# Patient Record
Sex: Male | Born: 1965
Health system: Southern US, Community
[De-identification: ages and names within clinical notes are randomized; demographics above are authoritative.]

## PROBLEM LIST (undated history)

## (undated) DIAGNOSIS — I10 Essential (primary) hypertension: Secondary | ICD-10-CM

## (undated) DIAGNOSIS — I428 Other cardiomyopathies: Secondary | ICD-10-CM

## (undated) DIAGNOSIS — E785 Hyperlipidemia, unspecified: Secondary | ICD-10-CM

## (undated) DIAGNOSIS — K219 Gastro-esophageal reflux disease without esophagitis: Secondary | ICD-10-CM

## (undated) DIAGNOSIS — E119 Type 2 diabetes mellitus without complications: Secondary | ICD-10-CM

## (undated) HISTORY — DX: Gastro-esophageal reflux disease without esophagitis: K21.9

## (undated) HISTORY — DX: Hyperlipidemia, unspecified: E78.5

## (undated) HISTORY — DX: Other cardiomyopathies: I42.8

## (undated) HISTORY — PX: HAND SURGERY: SHX662

---

## 2017-04-05 ENCOUNTER — Encounter (HOSPITAL_BASED_OUTPATIENT_CLINIC_OR_DEPARTMENT_OTHER): Payer: Self-pay | Admitting: Emergency Medicine

## 2017-04-05 ENCOUNTER — Other Ambulatory Visit: Payer: Self-pay

## 2017-04-05 ENCOUNTER — Emergency Department (HOSPITAL_BASED_OUTPATIENT_CLINIC_OR_DEPARTMENT_OTHER): Payer: Self-pay

## 2017-04-05 ENCOUNTER — Emergency Department (HOSPITAL_BASED_OUTPATIENT_CLINIC_OR_DEPARTMENT_OTHER)
Admission: EM | Admit: 2017-04-05 | Discharge: 2017-04-05 | Disposition: A | Discharge status: Home / Self Care | Payer: Self-pay | Attending: Emergency Medicine | Admitting: Emergency Medicine

## 2017-04-05 DIAGNOSIS — I10 Essential (primary) hypertension: Secondary | ICD-10-CM | POA: Insufficient documentation

## 2017-04-05 DIAGNOSIS — H9311 Tinnitus, right ear: Secondary | ICD-10-CM | POA: Insufficient documentation

## 2017-04-05 DIAGNOSIS — R42 Dizziness and giddiness: Secondary | ICD-10-CM

## 2017-04-05 HISTORY — DX: Essential (primary) hypertension: I10

## 2017-04-05 LAB — URINALYSIS, MICROSCOPIC (REFLEX)

## 2017-04-05 LAB — URINALYSIS, ROUTINE W REFLEX MICROSCOPIC
Bilirubin Urine: NEGATIVE
Ketones, ur: 15 mg/dL — AB
LEUKOCYTES UA: NEGATIVE
Nitrite: NEGATIVE
PROTEIN: NEGATIVE mg/dL
SPECIFIC GRAVITY, URINE: 1.015 (ref 1.005–1.030)
pH: 6 (ref 5.0–8.0)

## 2017-04-05 LAB — CBC
HEMATOCRIT: 46.2 % (ref 39.0–52.0)
HEMOGLOBIN: 15.7 g/dL (ref 13.0–17.0)
MCH: 25.7 pg — ABNORMAL LOW (ref 26.0–34.0)
MCHC: 34 g/dL (ref 30.0–36.0)
MCV: 75.6 fL — AB (ref 78.0–100.0)
Platelets: 321 10*3/uL (ref 150–400)
RBC: 6.11 MIL/uL — ABNORMAL HIGH (ref 4.22–5.81)
RDW: 13.9 % (ref 11.5–15.5)
WBC: 13.6 10*3/uL — ABNORMAL HIGH (ref 4.0–10.5)

## 2017-04-05 LAB — BASIC METABOLIC PANEL
ANION GAP: 12 (ref 5–15)
BUN: 16 mg/dL (ref 6–20)
CHLORIDE: 101 mmol/L (ref 101–111)
CO2: 22 mmol/L (ref 22–32)
Calcium: 9.3 mg/dL (ref 8.9–10.3)
Creatinine, Ser: 1.3 mg/dL — ABNORMAL HIGH (ref 0.61–1.24)
GFR calc Af Amer: >60 mL/min (ref >60)
GFR calc non Af Amer: >60 mL/min (ref >60)
GLUCOSE: 372 mg/dL — AB (ref 65–99)
POTASSIUM: 3.6 mmol/L (ref 3.5–5.1)
Sodium: 135 mmol/L (ref 135–145)

## 2017-04-05 LAB — MAGNESIUM: Magnesium: 1.7 mg/dL (ref 1.7–2.4)

## 2017-04-05 LAB — CBG MONITORING, ED: Glucose-Capillary: 328 mg/dL — ABNORMAL HIGH (ref 65–99)

## 2017-04-05 LAB — TROPONIN I: Troponin I: <0.03 ng/mL (ref <0.03)

## 2017-04-05 MED ORDER — SODIUM CHLORIDE 0.9 % IV BOLUS (SEPSIS)
1000.0000 mL | Freq: Once | INTRAVENOUS | Status: AC
  Administered 2017-04-05: 1000 mL via INTRAVENOUS

## 2017-04-05 NOTE — ED Provider Notes (Signed)
Emergency Department Provider Note   I have reviewed the triage vital signs and the nursing notes.   HISTORY  Chief Complaint Dizziness and Tinnitus   HPI Darian Cansler is a 52 y.o. male with PMH of HTN presents to the emergency department for evaluation of sudden onset lightheadedness, ringing in the right ear, and history of recent spasm type symptoms in the left arm and leg.  The spasm symptoms were primarily yesterday and seemed to resolve.  Today at 5:45 PM he developed acute onset lightheadedness with ringing in the right ear.  Immediately prior he had had some orange juice and lemonade to drink.  He has no known history of diabetes.  He denies any fevers or chills.  No chest pain or dyspnea.  He denies vertigo symptoms.  No neck pain.  Denies any weakness in the arms or legs.  No vision changes, speech changes, difficulty ambulating.  His ringing in the ears and spasm symptoms have resolved but he still feels somewhat lightheaded.    Past Medical History:  Diagnosis Date  . Hypertension     There are no active problems to display for this patient.   History reviewed. No pertinent surgical history.    Allergies Patient has no known allergies.  No family history on file.  Social History Social History   Tobacco Use  . Smoking status: Never Smoker  . Smokeless tobacco: Never Used  Substance Use Topics  . Alcohol use: Not Currently  . Drug use: Never    Review of Systems  Constitutional: No fever/chills. Positive lightheadedness.  Eyes: No visual changes. ENT: No sore throat. Tinnitus.  Cardiovascular: Denies chest pain. Respiratory: Denies shortness of breath. Gastrointestinal: No abdominal pain.  No nausea, no vomiting.  No diarrhea.  No constipation. Genitourinary: Negative for dysuria. Musculoskeletal: Negative for back pain. Skin: Negative for rash. Neurological: Negative for headaches, focal weakness or numbness.  10-point ROS otherwise  negative.  ____________________________________________   PHYSICAL EXAM:  VITAL SIGNS: ED Triage Vitals [04/05/17 1810]  Enc Vitals Group     BP (!) 156/113     Pulse Rate (!) 130     Resp 20     Temp 98.8 F (37.1 C)     Temp Source Oral     SpO2 94 %     Weight 283 lb (128.4 kg)     Height 5\' 8"  (1.727 m)     Pain Score 0   Constitutional: Alert and oriented. Well appearing and in no acute distress. Eyes: Conjunctivae are normal. PERRL. EOMI. Head: Atraumatic. Nose: No congestion/rhinnorhea. Mouth/Throat: Mucous membranes are moist.  Neck: No stridor.  Cardiovascular: Normal rate, regular rhythm. Good peripheral circulation. Grossly normal heart sounds.   Respiratory: Normal respiratory effort.  No retractions. Lungs CTAB. Gastrointestinal: Soft and nontender. No distention.  Musculoskeletal: No lower extremity tenderness nor edema. No gross deformities of extremities. Neurologic:  Normal speech and language. No gross focal neurologic deficits are appreciated. Normal CN exam 2-12. Normal gait.  Skin:  Skin is warm, dry and intact. No rash noted.  ____________________________________________   LABS (all labs ordered are listed, but only abnormal results are displayed)  Labs Reviewed  BASIC METABOLIC PANEL - Abnormal; Notable for the following components:      Result Value   Glucose, Bld 372 (*)    Creatinine, Ser 1.30 (*)    All other components within normal limits  CBC - Abnormal; Notable for the following components:   WBC 13.6 (*)  RBC 6.11 (*)    MCV 75.6 (*)    MCH 25.7 (*)    All other components within normal limits  URINALYSIS, ROUTINE W REFLEX MICROSCOPIC - Abnormal; Notable for the following components:   Glucose, UA >=500 (*)    Hgb urine dipstick TRACE (*)    Ketones, ur 15 (*)    All other components within normal limits  URINALYSIS, MICROSCOPIC (REFLEX) - Abnormal; Notable for the following components:   Bacteria, UA RARE (*)    Squamous  Epithelial / LPF 0-5 (*)    All other components within normal limits  CBG MONITORING, ED - Abnormal; Notable for the following components:   Glucose-Capillary 328 (*)    All other components within normal limits  MAGNESIUM  TROPONIN I   ____________________________________________  EKG  Rate: 126 PR: 160 QTc: 428  Sinus tachycardia. Normal axis. Narrow QRS. No ST elevation or depression. No STEMI.  ____________________________________________  RADIOLOGY  Dg Chest 2 View  Result Date: 04/05/2017 CLINICAL DATA:  Dizziness for 2 hours EXAM: CHEST - 2 VIEW COMPARISON:  None. FINDINGS: The heart size and mediastinal contours are within normal limits. Both lungs are clear. The visualized skeletal structures are unremarkable. IMPRESSION: No active cardiopulmonary disease. Electronically Signed   By: Inez Catalina M.D.   On: 04/05/2017 19:08    ____________________________________________   PROCEDURES  Procedure(s) performed:   Procedures  None ____________________________________________   INITIAL IMPRESSION / ASSESSMENT AND PLAN / ED COURSE  Pertinent labs & imaging results that were available during my care of the patient were reviewed by me and considered in my medical decision making (see chart for details).  Patient presents to the emergency department for evaluation of sudden onset lightheadedness with ringing in the ears and spasm the pain in the left side over the past 2 days. Denies any weakness/numbness. He shows no evidence of volume overload.  He is not having chest pain or difficulty breathing.  No palpitations.  On my evaluation at the bedside he is having frequent PVCs on monitor but no bigeminy.  He is afebrile here but very tachycardic.  I reviewed the EKG which shows a sinus tachycardia with narrow QRS and normal axis.   Labs reviewed. No acute findings. Imaging negative. Feeling much better after IVF. No concern for central cause of symptoms. Plan for PCP and  Cardiology follow up.   At this time, I do not feel there is any life-threatening condition present. I have reviewed and discussed all results (EKG, imaging, lab, urine as appropriate), exam findings with patient. I have reviewed nursing notes and appropriate previous records.  I feel the patient is safe to be discharged home without further emergent workup. Discussed usual and customary return precautions. Patient and family (if present) verbalize understanding and are comfortable with this plan.  Patient will follow-up with their primary care provider. If they do not have a primary care provider, information for follow-up has been provided to them. All questions have been answered.  ____________________________________________  FINAL CLINICAL IMPRESSION(S) / ED DIAGNOSES  Final diagnoses:  Lightheadedness     MEDICATIONS GIVEN DURING THIS VISIT:  Medications  sodium chloride 0.9 % bolus 1,000 mL (0 mLs Intravenous Stopped 04/05/17 2029)    Note:  This document was prepared using Dragon voice recognition software and may include unintentional dictation errors.  Nanda Quinton, MD Emergency Medicine    Larenz Frasier, [facility], MD 04/06/17 [phone]

## 2017-04-05 NOTE — Discharge Instructions (Signed)
You have been seen today in the Emergency Department (ED)  for lightheadedness.  Your workup including labs and EKG show reassuring results.  Your symptoms may be due to dehydration, so it is important that you drink plenty of non-alcoholic fluids.  Please call your regular doctor as soon as possible to schedule the next available clinic appointment to follow up with him/her regarding your visit to the ED and your symptoms.  Return to the Emergency Department (ED)  if you have any further syncopal episodes (pass out again) or develop ANY chest pain, pressure, tightness, trouble breathing, sudden sweating, or other symptoms that concern you.

## 2017-04-05 NOTE — ED Notes (Signed)
ED Provider at bedside. Dr. Long 

## 2017-04-05 NOTE — ED Triage Notes (Signed)
Pt c/o dizziness, ringing to R ear and L arm "spasms" at 5:30. Pt reports still feeling dizzy but all other symptoms have subsided.

## 2017-04-05 NOTE — ED Notes (Signed)
Pt given d/c instructions as per chart. Verbalizes understanding. No questions. 

## 2017-04-05 NOTE — ED Notes (Signed)
Patient transported to X-ray 

## 2017-09-08 ENCOUNTER — Emergency Department (HOSPITAL_BASED_OUTPATIENT_CLINIC_OR_DEPARTMENT_OTHER): Payer: Self-pay

## 2017-09-08 ENCOUNTER — Other Ambulatory Visit: Payer: Self-pay

## 2017-09-08 ENCOUNTER — Encounter (HOSPITAL_BASED_OUTPATIENT_CLINIC_OR_DEPARTMENT_OTHER): Payer: Self-pay | Admitting: *Deleted

## 2017-09-08 ENCOUNTER — Emergency Department (HOSPITAL_BASED_OUTPATIENT_CLINIC_OR_DEPARTMENT_OTHER)
Admission: EM | Admit: 2017-09-08 | Discharge: 2017-09-08 | Disposition: A | Discharge status: Home / Self Care | Payer: Self-pay | Attending: Emergency Medicine | Admitting: Emergency Medicine

## 2017-09-08 DIAGNOSIS — J4530 Mild persistent asthma, uncomplicated: Secondary | ICD-10-CM | POA: Insufficient documentation

## 2017-09-08 DIAGNOSIS — I10 Essential (primary) hypertension: Secondary | ICD-10-CM | POA: Insufficient documentation

## 2017-09-08 DIAGNOSIS — R911 Solitary pulmonary nodule: Secondary | ICD-10-CM | POA: Insufficient documentation

## 2017-09-08 DIAGNOSIS — J40 Bronchitis, not specified as acute or chronic: Secondary | ICD-10-CM | POA: Insufficient documentation

## 2017-09-08 LAB — CBC
HCT: 45 % (ref 39.0–52.0)
Hemoglobin: 15.3 g/dL (ref 13.0–17.0)
MCH: 25.3 pg — ABNORMAL LOW (ref 26.0–34.0)
MCHC: 34 g/dL (ref 30.0–36.0)
MCV: 74.5 fL — ABNORMAL LOW (ref 78.0–100.0)
Platelets: 282 10*3/uL (ref 150–400)
RBC: 6.04 MIL/uL — ABNORMAL HIGH (ref 4.22–5.81)
RDW: 13.4 % (ref 11.5–15.5)
WBC: 11.4 10*3/uL — AB (ref 4.0–10.5)

## 2017-09-08 LAB — D-DIMER, QUANTITATIVE: D-Dimer, Quant: 0.59 ug/mL-FEU — ABNORMAL HIGH (ref 0.00–0.50)

## 2017-09-08 LAB — BASIC METABOLIC PANEL
ANION GAP: 9 (ref 5–15)
BUN: 11 mg/dL (ref 6–20)
CALCIUM: 8.7 mg/dL — AB (ref 8.9–10.3)
CO2: 23 mmol/L (ref 22–32)
Chloride: 101 mmol/L (ref 98–111)
Creatinine, Ser: 0.82 mg/dL (ref 0.61–1.24)
GFR calc Af Amer: >60 mL/min (ref >60)
GFR calc non Af Amer: >60 mL/min (ref >60)
Glucose, Bld: 206 mg/dL — ABNORMAL HIGH (ref 70–99)
POTASSIUM: 3.8 mmol/L (ref 3.5–5.1)
Sodium: 133 mmol/L — ABNORMAL LOW (ref 135–145)

## 2017-09-08 LAB — TROPONIN I: Troponin I: <0.03 ng/mL (ref <0.03)

## 2017-09-08 MED ORDER — AZITHROMYCIN 250 MG PO TABS: 250.0000 mg | ORAL_TABLET | Freq: Every day | ORAL | 0 refills | Status: DC

## 2017-09-08 MED ORDER — PREDNISONE 10 MG PO TABS: 40.0000 mg | ORAL_TABLET | Freq: Every day | ORAL | 0 refills | Status: DC

## 2017-09-08 MED ORDER — ALBUTEROL SULFATE HFA 108 (90 BASE) MCG/ACT IN AERS
2.0000 | INHALATION_SPRAY | Freq: Four times a day (QID) | RESPIRATORY_TRACT | Status: DC
  Administered 2017-09-08: 2 via RESPIRATORY_TRACT
  Filled 2017-09-08: qty 6.7

## 2017-09-08 MED ORDER — IPRATROPIUM-ALBUTEROL 0.5-2.5 (3) MG/3ML IN SOLN
3.0000 mL | Freq: Four times a day (QID) | RESPIRATORY_TRACT | Status: DC
  Administered 2017-09-08: 3 mL via RESPIRATORY_TRACT
  Filled 2017-09-08: qty 3

## 2017-09-08 MED ORDER — SODIUM CHLORIDE 0.9 % IV SOLN
INTRAVENOUS | Status: DC
  Administered 2017-09-08: 20:00:00 via INTRAVENOUS

## 2017-09-08 MED ORDER — PREDNISONE 50 MG PO TABS
60.0000 mg | ORAL_TABLET | Freq: Once | ORAL | Status: AC
  Administered 2017-09-08: 60 mg via ORAL
  Filled 2017-09-08: qty 1

## 2017-09-08 MED ORDER — IOPAMIDOL (ISOVUE-370) INJECTION 76%
100.0000 mL | Freq: Once | INTRAVENOUS | Status: AC | PRN
  Administered 2017-09-08: 100 mL via INTRAVENOUS

## 2017-09-08 NOTE — ED Provider Notes (Signed)
[facility] Provider Note   CSN: [id number] Arrival date & time: 09/08/17  1652     History   Chief Complaint Chief Complaint  Patient presents with  . Chest Pain    HPI [facility] is a 52 y.o. male.  Patient with wheezing that comes and goes it started about 4 weeks ago.  Patient with no prior history of asthma.  Associated obviously with shortness of breath and recently a dry cough.  No fevers.  Patient is not a smoker.  No chest pain.     Past Medical History:  Diagnosis Date  . Hypertension     There are no active problems to display for this patient.   History reviewed. No pertinent surgical history.      Home Medications    Prior to Admission medications   Medication Sig Start Date End Date Taking? Authorizing Provider  azithromycin (ZITHROMAX) 250 MG tablet Take 1 tablet (250 mg total) by mouth daily. Take first 2 tablets together, then 1 every day until finished. 09/08/17   Fredia Sorrow, MD  predniSONE (DELTASONE) 10 MG tablet Take 4 tablets (40 mg total) by mouth daily. 09/08/17   Fredia Sorrow, MD    Family History No family history on file.  Social History Social History   Tobacco Use  . Smoking status: Never Smoker  . Smokeless tobacco: Never Used  Substance Use Topics  . Alcohol use: Not Currently  . Drug use: Never     Allergies   Patient has no known allergies.   Review of Systems Review of Systems  Constitutional: Negative for fever.  HENT: Negative for congestion.   Eyes: Negative for redness.  Respiratory: Positive for cough and shortness of breath.   Cardiovascular: Negative for chest pain.  Gastrointestinal: Negative for abdominal pain.  Genitourinary: Negative for dysuria.  Musculoskeletal: Negative for back pain.  Skin: Negative for rash.  Neurological: Negative for syncope and headaches.  Hematological: Does not bruise/bleed easily.  Psychiatric/Behavioral: Negative for  confusion.     Physical Exam Updated Vital Signs BP (!) 144/89   Pulse (!) 108   Temp 98.2 F (36.8 C) (Oral)   Resp 19   Ht 1.727 m (5\' 8" )   Wt 129.3 kg   SpO2 98%   BMI 43.33 kg/m   Physical Exam  Constitutional: He is oriented to person, place, and time. He appears well-developed and well-nourished. No distress.  HENT:  Head: Normocephalic and atraumatic.  Mouth/Throat: Oropharynx is clear and moist.  Eyes: Pupils are equal, round, and reactive to light. Conjunctivae and EOM are normal.  Neck: Neck supple.  Cardiovascular: Normal rate, regular rhythm and normal heart sounds.  Pulmonary/Chest: Effort normal and breath sounds normal. No respiratory distress. He has no wheezes.  Abdominal: Soft. Bowel sounds are normal. There is no tenderness.  Musculoskeletal: Normal range of motion. He exhibits no edema.  Neurological: He is alert and oriented to person, place, and time. No cranial nerve deficit or sensory deficit. He exhibits normal muscle tone. Coordination normal.  Skin: Skin is warm. No rash noted.  Nursing note and vitals reviewed.    ED Treatments / Results  Labs (all labs ordered are listed, but only abnormal results are displayed) Labs Reviewed  CBC - Abnormal; Notable for the following components:      Result Value   WBC 11.4 (*)    RBC 6.04 (*)    MCV 74.5 (*)    MCH 25.3 (*)  All other components within normal limits  BASIC METABOLIC PANEL - Abnormal; Notable for the following components:   Sodium 133 (*)    Glucose, Bld 206 (*)    Calcium 8.7 (*)    All other components within normal limits  D-DIMER, QUANTITATIVE (NOT AT [facility]) - Abnormal; Notable for the following components:   D-Dimer, Quant 0.59 (*)    All other components within normal limits  TROPONIN I    EKG EKG Interpretation  Date/Time:  Monday September 08 2017 17:08:31 EDT Ventricular Rate:  110 PR Interval:  172 QRS Duration: 104 QT Interval:  346 QTC Calculation: 468 R  Axis:   -11 Text Interpretation:  Sinus tachycardia Right atrial enlargement Nonspecific T wave abnormality Abnormal ECG Confirmed by Fredia Sorrow [phone]) on 09/08/2017 5:11:05 PM   Radiology Dg Chest 2 View  Result Date: 09/08/2017 CLINICAL DATA:  Shortness of breath for months. EXAM: CHEST - 2 VIEW COMPARISON:  Chest radiograph April 05, 2017 FINDINGS: Cardiomediastinal silhouette is normal. No pleural effusions or focal consolidations. Mild interstitial prominence. Trachea projects midline and there is no pneumothorax. Soft tissue planes and included osseous structures are non-suspicious. IMPRESSION: Mild interstitial prominence, atypical infection possible. No focal consolidation. Electronically Signed   By: Elon Alas M.D.   On: 09/08/2017 19:14   Ct Angio Chest Pe W/cm &/or Wo Cm  Result Date: 09/08/2017 CLINICAL DATA:  Positive D-dimer.  Wheezing. EXAM: CT ANGIOGRAPHY CHEST WITH CONTRAST TECHNIQUE: Multidetector CT imaging of the chest was performed using the standard protocol during bolus administration of intravenous contrast. Multiplanar CT image reconstructions and MIPs were obtained to evaluate the vascular anatomy. CONTRAST:  133mL ISOVUE-370 IOPAMIDOL (ISOVUE-370) INJECTION 76% COMPARISON:  Chest x-ray earlier today FINDINGS: Cardiovascular: No filling defects in the pulmonary arteries to suggest pulmonary emboli. Insert Heart scattered calcifications in the left anterior descending coronary artery. Mediastinum/Nodes: No mediastinal, hilar, or axillary adenopathy. Scattered reactive mediastinal lymph nodes. Lungs/Pleura: Trace bilateral pleural effusions. Mild interstitial prominence and peribronchial thickening may reflect bronchitis. Ground-glass nodular opacity in the superior segment of the left lower lobe measures 15 mm. Favor infectious/inflammatory. This warrants follow-up. Upper Abdomen: Mild diffuse fatty infiltration of the liver. No acute findings. Musculoskeletal: Chest  wall soft tissues are unremarkable. No acute bony abnormality. Review of the MIP images confirms the above findings. IMPRESSION: No evidence of pulmonary embolus. Interstitial prominence and peribronchial thickening throughout the lungs which could reflect bronchitis or asthma. 15 mm ground-glass nodular opacity in the superior segment of the left lower lobe. Initial follow-up with CT at 6-12 months is recommended to confirm persistence. If persistent, repeat CT is recommended every 2 years until 5 years of stability has been established. This recommendation follows the consensus statement: Guidelines for Management of Incidental Pulmonary Nodules Detected on CT Images: From the Fleischner Society 2017; Radiology 2017; 284:228-243. Trace bilateral pleural effusions. Electronically Signed   By: Rolm Baptise M.D.   On: 09/08/2017 20:56    Procedures Procedures (including critical care time)  Medications Ordered in ED Medications  ipratropium-albuterol (DUONEB) 0.5-2.5 (3) MG/3ML nebulizer solution 3 mL (3 mLs Nebulization Given 09/08/17 1907)  0.9 %  sodium chloride infusion ( Intravenous New Bag/Given 09/08/17 1955)  predniSONE (DELTASONE) tablet 60 mg (has no administration in time range)  albuterol (PROVENTIL HFA;VENTOLIN HFA) 108 (90 Base) MCG/ACT inhaler 2 puff (has no administration in time range)  iopamidol (ISOVUE-370) 76 % injection 100 mL (100 mLs Intravenous Contrast Given 09/08/17 2033)     Initial Impression /  Assessment and Plan / ED Course  I have reviewed the triage vital signs and the nursing notes.  Pertinent labs & imaging results that were available during my care of the patient were reviewed by me and considered in my medical decision making (see chart for details).     Patient without any wheezing here.  But was giving a history of some wheezing on and off for the past month or so.  Patient non-smoker.  Chest x-ray without any significant findings.  Troponin negative d-dimer  slightly elevated so CT angios chest no evidence of pulmonary embolism.  However CT chest did raise few concerns showing some inflammatory changes it could be consistent with bronchitis or asthma type picture.  Also there was a left lower lobe nodule that will require close follow-up.  Patient be treated with a dose of prednisone 60 mg here continue with prednisone for the next 5 days.  Albuterol inhaler provided here and shown how to use we will continue that for the next 7 days.  And patient also given a Z-Pak antibiotic.  Patient understands that he needs follow-up for the pulmonary nodule in 6 months.  Patient is also going to seek to find a primary care provider.  Patient will return for any new or worse symptoms.  Patient nontoxic no acute distress.  Final Clinical Impressions(s) / ED Diagnoses   Final diagnoses:  Bronchitis  Mild persistent reactive airway disease without complication  Pulmonary nodule, left    ED Discharge Orders         Ordered    azithromycin (ZITHROMAX) 250 MG tablet  Daily     09/08/17 2119    predniSONE (DELTASONE) 10 MG tablet  Daily     09/08/17 2119           Fredia Sorrow, MD 09/08/17 2128

## 2017-09-08 NOTE — ED Notes (Signed)
Patient transported to X-ray 

## 2017-09-08 NOTE — ED Triage Notes (Signed)
Wheezing that comes and goes started 4 weeks ago. States it feels like anxiety. No hx of asthma.

## 2017-09-08 NOTE — ED Notes (Signed)
ED Provider at bedside. 

## 2017-09-08 NOTE — Discharge Instructions (Addendum)
Use albuterol inhaler 2 puffs every 6 hours for the next 7 days.  Take prednisone for the next 5 days.  Take the antibiotic as directed over the next few days.  Part of your work-up today identified a left-sided pulmonary nodule this will require close follow-up and repeat evaluation in 6 months.  Return for any new or worse symptoms.

## 2017-09-08 NOTE — ED Notes (Signed)
Pt in CT.

## 2017-09-27 ENCOUNTER — Other Ambulatory Visit: Payer: Self-pay

## 2017-09-27 ENCOUNTER — Emergency Department (HOSPITAL_BASED_OUTPATIENT_CLINIC_OR_DEPARTMENT_OTHER)
Admission: EM | Admit: 2017-09-27 | Discharge: 2017-09-27 | Disposition: A | Discharge status: Home / Self Care | Payer: Self-pay | Attending: Emergency Medicine | Admitting: Emergency Medicine

## 2017-09-27 ENCOUNTER — Encounter (HOSPITAL_BASED_OUTPATIENT_CLINIC_OR_DEPARTMENT_OTHER): Payer: Self-pay | Admitting: Emergency Medicine

## 2017-09-27 ENCOUNTER — Emergency Department (HOSPITAL_BASED_OUTPATIENT_CLINIC_OR_DEPARTMENT_OTHER): Payer: Self-pay

## 2017-09-27 DIAGNOSIS — I10 Essential (primary) hypertension: Secondary | ICD-10-CM | POA: Insufficient documentation

## 2017-09-27 DIAGNOSIS — Z79899 Other long term (current) drug therapy: Secondary | ICD-10-CM | POA: Insufficient documentation

## 2017-09-27 DIAGNOSIS — E1165 Type 2 diabetes mellitus with hyperglycemia: Secondary | ICD-10-CM

## 2017-09-27 DIAGNOSIS — R0789 Other chest pain: Secondary | ICD-10-CM | POA: Insufficient documentation

## 2017-09-27 DIAGNOSIS — E119 Type 2 diabetes mellitus without complications: Secondary | ICD-10-CM | POA: Insufficient documentation

## 2017-09-27 LAB — BASIC METABOLIC PANEL
ANION GAP: 14 (ref 5–15)
BUN: 12 mg/dL (ref 6–20)
CO2: 21 mmol/L — ABNORMAL LOW (ref 22–32)
Calcium: 8.9 mg/dL (ref 8.9–10.3)
Chloride: 101 mmol/L (ref 98–111)
Creatinine, Ser: 0.96 mg/dL (ref 0.61–1.24)
GFR calc Af Amer: >60 mL/min (ref >60)
Glucose, Bld: 240 mg/dL — ABNORMAL HIGH (ref 70–99)
POTASSIUM: 3.5 mmol/L (ref 3.5–5.1)
Sodium: 136 mmol/L (ref 135–145)

## 2017-09-27 LAB — CBC
HCT: 44 % (ref 39.0–52.0)
Hemoglobin: 15 g/dL (ref 13.0–17.0)
MCH: 25.5 pg — ABNORMAL LOW (ref 26.0–34.0)
MCHC: 34.1 g/dL (ref 30.0–36.0)
MCV: 74.8 fL — ABNORMAL LOW (ref 78.0–100.0)
PLATELETS: 307 10*3/uL (ref 150–400)
RBC: 5.88 MIL/uL — AB (ref 4.22–5.81)
RDW: 13.6 % (ref 11.5–15.5)
WBC: 14.8 10*3/uL — AB (ref 4.0–10.5)

## 2017-09-27 LAB — TROPONIN I: Troponin I: <0.03 ng/mL (ref <0.03)

## 2017-09-27 MED ORDER — METFORMIN HCL 1000 MG PO TABS: 1000.0000 mg | ORAL_TABLET | Freq: Every evening | ORAL | 2 refills | Status: DC

## 2017-09-27 MED ORDER — GI COCKTAIL ~~LOC~~
30.0000 mL | Freq: Once | ORAL | Status: AC
  Administered 2017-09-27: 30 mL via ORAL
  Filled 2017-09-27: qty 30

## 2017-09-27 MED ORDER — ALBUTEROL SULFATE HFA 108 (90 BASE) MCG/ACT IN AERS: 1.0000 | INHALATION_SPRAY | Freq: Four times a day (QID) | RESPIRATORY_TRACT | 2 refills | Status: DC | PRN

## 2017-09-27 NOTE — Discharge Instructions (Signed)
Work-up for the chest pain without any acute findings here today.  However we are seeing the trend for elevated blood sugars.  Recommend that she start the metformin with your evening meal.  Make an appointment with the wellness clinic to follow-up.  Her follow-up at the [facility] urgent care.  Return for any new or worse symptoms.  The Prilosec that you have at home and recently got just continue to take that for 2 weeks.

## 2017-09-27 NOTE — ED Provider Notes (Signed)
[facility] EMERGENCY DEPARTMENT Provider Note   CSN: [id number] Arrival date & time: 09/27/17  0719     History   Chief Complaint Chief Complaint  Patient presents with  . Chest Pain    HPI [facility] English is a 52 y.o. male.  Patient presents with burning in the chest substernal area since 8 PM last night.  It has been constant.  Nonradiating.  Associated with frequent burping.  Worse this morning.  Patient also seen in August for chest discomfort and shortness of breath.  With negative work-up at that time including CT angios.  At that time patient's blood pressure was borderline and blood sugars were in the 200 range.  Patient did get Prilosec over-the-counter and started taking that no significant improvement.     Past Medical History:  Diagnosis Date  . Hypertension     There are no active problems to display for this patient.   History reviewed. No pertinent surgical history.      Home Medications    Prior to Admission medications   Medication Sig Start Date End Date Taking? Authorizing Provider  albuterol (PROVENTIL HFA;VENTOLIN HFA) 108 (90 Base) MCG/ACT inhaler Inhale 1-2 puffs into the lungs every 6 (six) hours as needed for wheezing or shortness of breath. 09/27/17   Fredia Sorrow, MD  azithromycin (ZITHROMAX) 250 MG tablet Take 1 tablet (250 mg total) by mouth daily. Take first 2 tablets together, then 1 every day until finished. 09/08/17   Fredia Sorrow, MD  metFORMIN (GLUCOPHAGE) 1000 MG tablet Take 1 tablet (1,000 mg total) by mouth every evening. 09/27/17   Fredia Sorrow, MD  predniSONE (DELTASONE) 10 MG tablet Take 4 tablets (40 mg total) by mouth daily. 09/08/17   Fredia Sorrow, MD    Family History No family history on file.  Social History Social History   Tobacco Use  . Smoking status: Never Smoker  . Smokeless tobacco: Never Used  Substance Use Topics  . Alcohol use: Not Currently  . Drug use: Never     Allergies     Patient has no known allergies.   Review of Systems Review of Systems  Constitutional: Negative for fever.  HENT: Negative for congestion.   Eyes: Negative for visual disturbance.  Respiratory: Negative for shortness of breath.   Cardiovascular: Positive for chest pain.  Gastrointestinal: Negative for abdominal pain, nausea and vomiting.  Genitourinary: Negative for dysuria and frequency.  Musculoskeletal: Negative for back pain.  Skin: Negative for rash.  Neurological: Negative for headaches.  Hematological: Does not bruise/bleed easily.  Psychiatric/Behavioral: Negative for confusion.     Physical Exam Updated Vital Signs BP (!) 137/98   Pulse (!) 102   Temp 98.4 F (36.9 C) (Oral)   Resp (!) 24   Ht 1.727 m (5\' 8" )   Wt 129.3 kg   SpO2 93%   BMI 43.33 kg/m   Physical Exam  Constitutional: He is oriented to person, place, and time. He appears well-developed and well-nourished. No distress.  HENT:  Head: Normocephalic and atraumatic.  Mouth/Throat: Oropharynx is clear and moist.  Eyes: Pupils are equal, round, and reactive to light. Conjunctivae and EOM are normal.  Neck: Neck supple.  Cardiovascular: Normal rate, regular rhythm and normal heart sounds.  Pulmonary/Chest: Effort normal and breath sounds normal. No respiratory distress.  Abdominal: Soft. Bowel sounds are normal. There is no tenderness.  Musculoskeletal: Normal range of motion. He exhibits no edema.  Neurological: He is alert and oriented to person, place,  and time. No cranial nerve deficit or sensory deficit. He exhibits normal muscle tone. Coordination normal.  Skin: Skin is warm.  Nursing note and vitals reviewed.    ED Treatments / Results  Labs (all labs ordered are listed, but only abnormal results are displayed) Labs Reviewed  CBC - Abnormal; Notable for the following components:      Result Value   WBC 14.8 (*)    RBC 5.88 (*)    MCV 74.8 (*)    MCH 25.5 (*)    All other components  within normal limits  BASIC METABOLIC PANEL - Abnormal; Notable for the following components:   CO2 21 (*)    Glucose, Bld 240 (*)    All other components within normal limits  TROPONIN I    EKG EKG Interpretation  Date/Time:  Saturday September 27 2017 07:29:59 EDT Ventricular Rate:  112 PR Interval:    QRS Duration: 104 QT Interval:  353 QTC Calculation: 482 R Axis:   3 Text Interpretation:  Sinus tachycardia Ventricular premature complex Consider right atrial enlargement Borderline low voltage, extremity leads Borderline prolonged QT interval Baseline wander in lead(s) V1 No significant change since last tracing Confirmed by Fredia Sorrow [phone]) on 09/27/2017 7:37:56 AM   Radiology Dg Chest 2 View  Result Date: 09/27/2017 CLINICAL DATA:  52 year old male with history of mid sternal burning sensation since yesterday. No shortness of breath. EXAM: CHEST - 2 VIEW COMPARISON:  Chest x-ray 09/08/2017. FINDINGS: Lung volumes are normal. No consolidative airspace disease. No pleural effusions. No pneumothorax. No pulmonary nodule or mass noted. Pulmonary vasculature and the cardiomediastinal silhouette are within normal limits. IMPRESSION: No radiographic evidence of acute cardiopulmonary disease. Electronically Signed   By: Vinnie Langton M.D.   On: 09/27/2017 07:58    Procedures Procedures (including critical care time)  Medications Ordered in ED Medications  gi cocktail (Maalox,Lidocaine,Donnatal) (30 mLs Oral Given 09/27/17 0998)     Initial Impression / Assessment and Plan / ED Course  I have reviewed the triage vital signs and the nursing notes.  Pertinent labs & imaging results that were available during my care of the patient were reviewed by me and considered in my medical decision making (see chart for details).    Patient's work-up for the chest pain here without any acute findings.  EKG without any significant changes from before.  Did have some baseline  tachycardia.  Slightly above 100.  Troponin here today negative chest x-ray negative.  Do not clinically think this is associated with a pulmonary embolus.  Rule no shortness of breath.  Plus patient had CT angios in August for shortness of breath and chest pain at that time which was negative.  Patient's blood pressure remains borderline for hypertension.  And patient's blood sugars now been consistently in the 200s or 300 range when he is been seen in the emergency department.  Feel the patient does have type 2 diabetes.  We will start him on metformin.  Have patient continue Prilosec that he started over-the-counter yesterday.  And patient has benefited from the albuterol he was given in August so will give a renewal prescription for that.  We will have patient follow-up with the wellness clinic.  Patient given GI cocktail here with improvement in pain but not complete resolution.  Feel that patient's symptoms are not related to an acute cardiac or pulmonary event.  But more related perhaps to underlying diabetes and more consistent with reflux here today.   Final Clinical  Impressions(s) / ED Diagnoses   Final diagnoses:  Atypical chest pain  Type 2 diabetes mellitus with hyperglycemia, without long-term current use of insulin (HCC)  Essential hypertension    ED Discharge Orders         Ordered    albuterol (PROVENTIL HFA;VENTOLIN HFA) 108 (90 Base) MCG/ACT inhaler  Every 6 hours PRN     09/27/17 0842    metFORMIN (GLUCOPHAGE) 1000 MG tablet  Every evening     09/27/17 0842           Fredia Sorrow, MD 09/27/17 [phone]

## 2017-09-27 NOTE — ED Triage Notes (Signed)
Burning in chest since 8pm last night, has had frequent burping. Worse this am, to mid chest

## 2017-09-28 ENCOUNTER — Emergency Department (HOSPITAL_BASED_OUTPATIENT_CLINIC_OR_DEPARTMENT_OTHER)
Admission: EM | Admit: 2017-09-28 | Discharge: 2017-09-28 | Disposition: A | Discharge status: Home / Self Care | Payer: Self-pay | Attending: Emergency Medicine | Admitting: Emergency Medicine

## 2017-09-28 ENCOUNTER — Encounter (HOSPITAL_BASED_OUTPATIENT_CLINIC_OR_DEPARTMENT_OTHER): Payer: Self-pay

## 2017-09-28 ENCOUNTER — Other Ambulatory Visit: Payer: Self-pay

## 2017-09-28 DIAGNOSIS — R110 Nausea: Secondary | ICD-10-CM | POA: Insufficient documentation

## 2017-09-28 DIAGNOSIS — I10 Essential (primary) hypertension: Secondary | ICD-10-CM | POA: Insufficient documentation

## 2017-09-28 DIAGNOSIS — R000 Tachycardia, unspecified: Secondary | ICD-10-CM | POA: Insufficient documentation

## 2017-09-28 DIAGNOSIS — R1013 Epigastric pain: Secondary | ICD-10-CM | POA: Insufficient documentation

## 2017-09-28 DIAGNOSIS — Z7984 Long term (current) use of oral hypoglycemic drugs: Secondary | ICD-10-CM | POA: Insufficient documentation

## 2017-09-28 DIAGNOSIS — Z79899 Other long term (current) drug therapy: Secondary | ICD-10-CM | POA: Insufficient documentation

## 2017-09-28 DIAGNOSIS — R42 Dizziness and giddiness: Secondary | ICD-10-CM | POA: Insufficient documentation

## 2017-09-28 LAB — CBG MONITORING, ED: GLUCOSE-CAPILLARY: 295 mg/dL — AB (ref 70–99)

## 2017-09-28 MED ORDER — ONDANSETRON 4 MG PO TBDP: 4.0000 mg | ORAL_TABLET | Freq: Three times a day (TID) | ORAL | 0 refills | Status: DC | PRN

## 2017-09-28 MED ORDER — GI COCKTAIL ~~LOC~~
30.0000 mL | Freq: Once | ORAL | Status: AC
  Administered 2017-09-28: 30 mL via ORAL
  Filled 2017-09-28: qty 30

## 2017-09-28 NOTE — ED Triage Notes (Signed)
Pt was started on metformin yesterday. Today he is having nausea, dizziness, and burning in his chest.

## 2017-09-28 NOTE — ED Provider Notes (Signed)
[facility] Provider Note   CSN: [id number] Arrival date & time: 09/28/17  1909     History   Chief Complaint Chief Complaint  Patient presents with  . Medication Reaction    HPI [facility] is a 52 y.o. male.  Patient presents the emergency department today with complaint of increased nausea, gassiness, and belching after starting metformin yesterday.  Patient reports having a burning pain in his chest for the past several weeks.  He has been seen in the emergency department on 2 occasions for this.  In August he had a CT Angio of the chest which did not demonstrate any PE.  Patient is aware of the hazy area that needs to be followed up by primary care doctor in 6 to 12 months.  He was seen again yesterday with reassuring lab work, from a cardiac standpoint, but has had persistently elevated blood sugar so he was started on metformin, 1000 mg, once a day.  Today he felt a little bit more lightheadedness than usual.  The burning in his chest is waxing and waning but similar to previous.  Onset of symptoms acute.  Course is persistent.     Past Medical History:  Diagnosis Date  . Hypertension     There are no active problems to display for this patient.   History reviewed. No pertinent surgical history.      Home Medications    Prior to Admission medications   Medication Sig Start Date End Date Taking? Authorizing Provider  albuterol (PROVENTIL HFA;VENTOLIN HFA) 108 (90 Base) MCG/ACT inhaler Inhale 1-2 puffs into the lungs every 6 (six) hours as needed for wheezing or shortness of breath. 09/27/17   Fredia Sorrow, MD  azithromycin (ZITHROMAX) 250 MG tablet Take 1 tablet (250 mg total) by mouth daily. Take first 2 tablets together, then 1 every day until finished. 09/08/17   Fredia Sorrow, MD  metFORMIN (GLUCOPHAGE) 1000 MG tablet Take 1 tablet (1,000 mg total) by mouth every evening. 09/27/17   Fredia Sorrow, MD  predniSONE  (DELTASONE) 10 MG tablet Take 4 tablets (40 mg total) by mouth daily. 09/08/17   Fredia Sorrow, MD    Family History No family history on file.  Social History Social History   Tobacco Use  . Smoking status: Never Smoker  . Smokeless tobacco: Never Used  Substance Use Topics  . Alcohol use: Not Currently  . Drug use: Never     Allergies   Patient has no known allergies.   Review of Systems Review of Systems  Constitutional: Negative for fever.  HENT: Negative for rhinorrhea and sore throat.   Eyes: Negative for redness.  Respiratory: Negative for cough.   Cardiovascular: Positive for chest pain (Burning pain in mid chest).  Gastrointestinal: Positive for nausea. Negative for abdominal pain, diarrhea and vomiting.  Genitourinary: Negative for dysuria.  Musculoskeletal: Negative for myalgias.  Skin: Negative for rash.  Neurological: Positive for light-headedness. Negative for headaches.     Physical Exam Updated Vital Signs BP (!) 165/111 (BP Location: Left Arm)   Pulse (!) 115   Temp 98.3 F (36.8 C) (Oral)   Resp 20   SpO2 96%   Physical Exam  Constitutional: He appears well-developed and well-nourished.  HENT:  Head: Normocephalic and atraumatic.  Mouth/Throat: Oropharynx is clear and moist.  Eyes: Conjunctivae are normal. Right eye exhibits no discharge. Left eye exhibits no discharge.  Neck: Normal range of motion. Neck supple.  Cardiovascular: Regular rhythm and normal  heart sounds. Tachycardia present.  Pulmonary/Chest: Effort normal and breath sounds normal. No respiratory distress. He has no wheezes. He has no rales.  Abdominal: Soft. There is no tenderness. There is no rebound and no guarding.  Neurological: He is alert.  Skin: Skin is warm and dry.  Psychiatric: He has a normal mood and affect.  Nursing note and vitals reviewed.    ED Treatments / Results  Labs (all labs ordered are listed, but only abnormal results are displayed) Labs  Reviewed  CBG MONITORING, ED - Abnormal; Notable for the following components:      Result Value   Glucose-Capillary 295 (*)    All other components within normal limits    ED ECG REPORT   Date: 09/28/2017  Rate: 117  Rhythm: sinus tachycardia with ectopy  QRS Axis: normal  Intervals: normal  ST/T Wave abnormalities: normal  Conduction Disutrbances:none  Narrative Interpretation:   Old EKG Reviewed: unchanged  I have personally reviewed the EKG tracing and agree with the computerized printout as noted.  Radiology Dg Chest 2 View  Result Date: 09/27/2017 CLINICAL DATA:  53 year old male with history of mid sternal burning sensation since yesterday. No shortness of breath. EXAM: CHEST - 2 VIEW COMPARISON:  Chest x-ray 09/08/2017. FINDINGS: Lung volumes are normal. No consolidative airspace disease. No pleural effusions. No pneumothorax. No pulmonary nodule or mass noted. Pulmonary vasculature and the cardiomediastinal silhouette are within normal limits. IMPRESSION: No radiographic evidence of acute cardiopulmonary disease. Electronically Signed   By: Vinnie Langton M.D.   On: 09/27/2017 07:58    Procedures Procedures (including critical care time)  Medications Ordered in ED Medications  gi cocktail (Maalox,Lidocaine,Donnatal) (30 mLs Oral Given 09/28/17 2044)     Initial Impression / Assessment and Plan / ED Course  I have reviewed the triage vital signs and the nursing notes.  Pertinent labs & imaging results that were available during my care of the patient were reviewed by me and considered in my medical decision making (see chart for details).     Patient seen and examined.  Reviewed previous work-up.  No signs of DKA yesterday.  Patient has had persistently elevated heart rates noted back to last month.  Yesterday heart rate was 112.  Tachycardia seems to be persistent.  EKG today reviewed.  Vital signs reviewed and are as follows: BP (!) 165/111 (BP Location: Left  Arm)   Pulse (!) 115   Temp 98.3 F (36.8 C) (Oral)   Resp 20   SpO2 96%   We will give GI cocktail.  I discussed the common GI side effects of metformin with the patient including nausea and diarrhea.  Patient will decrease his metformin to 500 mg once a day for the time being.  He states that he is going to be looking for a primary care doctor tomorrow.  He will continue to take Prilosec.  Final Clinical Impressions(s) / ED Diagnoses   Final diagnoses:  Nausea  Dyspepsia  Tachycardia   Patient presents with mainly worsening GI symptoms after beginning metformin yesterday.  Patient has been worked up twice in the past month for burning pain in his chest.  He has had CT angiography performed and cardiac work-up which have been unrevealing.  He has had a persistent tachycardia since last month.  No evidence of DKA yesterday.  Patient does not have vomiting or abdominal pain today to suggest DKA.  I suspect that some patient's symptoms are mainly due to starting metformin yesterday.  We talked about decreasing his dose for a week and about the importance of controlling blood sugars.  He is going to be working on getting a primary care appointment tomorrow.  EKG rechecked here tonight showing continued sinus tachycardia.    ED Discharge Orders         Ordered    ondansetron (ZOFRAN ODT) 4 MG disintegrating tablet  Every 8 hours PRN     09/28/17 2057           Carlisle Cater, Hershal Coria 09/28/17 2102    Jola Schmidt, MD 09/28/17 [phone]

## 2017-09-28 NOTE — Discharge Instructions (Signed)
Please read and follow all provided instructions.  Your diagnoses today include:  1. Nausea   2. Dyspepsia     Tests performed today include:  Blood sugar  EKG - shows persistently fast heart rate, same as yesterday and previous visit  Vital signs. See below for your results today.   Medications prescribed:   Zofran (ondansetron) - for nausea and vomiting  Take any prescribed medications only as directed.  Home care instructions:  Follow any educational materials contained in this packet.  BE VERY CAREFUL not to take multiple medicines containing Tylenol (also called acetaminophen). Doing so can lead to an overdose which can damage your liver and cause liver failure and possibly death.   Follow-up instructions: You will need to follow-up with your primary care doctor for evaluation of your diabetes.  Please reduce metformin dose from 1000mg  every day to 500 mg every day for the next 1 week and then increase to 500 mg twice a day.  Return instructions:   Please return to the Emergency Department if you experience worsening symptoms.   Please return if you have any other emergent concerns.  Additional Information:  Your vital signs today were: BP (!) 153/114    Pulse (!) 110    Temp 98.3 F (36.8 C) (Oral)    Resp 20    SpO2 98%  If your blood pressure (BP) was elevated above 135/85 this visit, please have this repeated by your doctor within one month. --------------

## 2017-09-30 ENCOUNTER — Emergency Department (HOSPITAL_COMMUNITY): Payer: Self-pay

## 2017-09-30 ENCOUNTER — Encounter (HOSPITAL_COMMUNITY): Payer: Self-pay | Admitting: Emergency Medicine

## 2017-09-30 ENCOUNTER — Emergency Department (HOSPITAL_COMMUNITY)
Admission: EM | Admit: 2017-09-30 | Discharge: 2017-09-30 | Disposition: A | Discharge status: Home / Self Care | Payer: Self-pay | Attending: Emergency Medicine | Admitting: Emergency Medicine

## 2017-09-30 DIAGNOSIS — K219 Gastro-esophageal reflux disease without esophagitis: Secondary | ICD-10-CM | POA: Insufficient documentation

## 2017-09-30 DIAGNOSIS — I10 Essential (primary) hypertension: Secondary | ICD-10-CM | POA: Insufficient documentation

## 2017-09-30 LAB — BASIC METABOLIC PANEL
ANION GAP: 13 (ref 5–15)
BUN: 15 mg/dL (ref 6–20)
CALCIUM: 9.5 mg/dL (ref 8.9–10.3)
CO2: 24 mmol/L (ref 22–32)
Chloride: 101 mmol/L (ref 98–111)
Creatinine, Ser: 1.04 mg/dL (ref 0.61–1.24)
GFR calc Af Amer: >60 mL/min (ref >60)
GLUCOSE: 284 mg/dL — AB (ref 70–99)
POTASSIUM: 4.5 mmol/L (ref 3.5–5.1)
Sodium: 138 mmol/L (ref 135–145)

## 2017-09-30 LAB — I-STAT TROPONIN, ED: Troponin i, poc: 0 ng/mL (ref 0.00–0.08)

## 2017-09-30 LAB — CBC
HEMATOCRIT: 46 % (ref 39.0–52.0)
HEMOGLOBIN: 15.5 g/dL (ref 13.0–17.0)
MCH: 26.1 pg (ref 26.0–34.0)
MCHC: 33.7 g/dL (ref 30.0–36.0)
MCV: 77.4 fL — ABNORMAL LOW (ref 78.0–100.0)
Platelets: 326 10*3/uL (ref 150–400)
RBC: 5.94 MIL/uL — ABNORMAL HIGH (ref 4.22–5.81)
RDW: 13.2 % (ref 11.5–15.5)
WBC: 11.4 10*3/uL — ABNORMAL HIGH (ref 4.0–10.5)

## 2017-09-30 LAB — CBG MONITORING, ED: GLUCOSE-CAPILLARY: 257 mg/dL — AB (ref 70–99)

## 2017-09-30 MED ORDER — SUCRALFATE 1 G PO TABS: 1.0000 g | ORAL_TABLET | Freq: Four times a day (QID) | ORAL | 0 refills | Status: DC | PRN

## 2017-09-30 MED ORDER — GI COCKTAIL ~~LOC~~
30.0000 mL | Freq: Once | ORAL | Status: AC
  Administered 2017-09-30: 30 mL via ORAL
  Filled 2017-09-30: qty 30

## 2017-09-30 NOTE — ED Provider Notes (Signed)
[facility] Emergency Department Provider Note MRN:  [id number]  Arrival date & time: 09/30/17     Chief Complaint   Chest pain History of Present Illness   [facility] is a 52 y.o. year-old male with a history of hypertension, diabetes presenting to the ED with chief complaint of chest pain.  The pain is located in the central chest, at times radiating to the left upper chest.  Described as a burning pain, has been present for 3 days, waxing and waning.  The pain is intermittent, but frequent, worse at night, worse when laying flat.  Was seen at [facility] 2 days ago and given GI cocktail with significant improvement.  Has been taking Prilosec, but continues to experience the discomfort.  Denies any associated symptoms, no headache or vision change, no dizziness or diaphoresis, no nausea or vomiting, no shortness of breath, no abdominal pain.  Pain is moderate in severity.  Review of Systems  A complete 10 system review of systems was obtained and all systems are negative except as noted in the HPI and PMH.   Patient's Health History    Past Medical History:  Diagnosis Date  . Hypertension     History reviewed. No pertinent surgical history.  No family history on file.  Social History   Socioeconomic History  . Marital status: Married    Spouse name: Not on file  . Number of children: Not on file  . Years of education: Not on file  . Highest education level: Not on file  Occupational History  . Not on file  Social Needs  . Financial resource strain: Not on file  . Food insecurity:    Worry: Not on file    Inability: Not on file  . Transportation needs:    Medical: Not on file    Non-medical: Not on file  Tobacco Use  . Smoking status: Never Smoker  . Smokeless tobacco: Never Used  Substance and Sexual Activity  . Alcohol use: Not Currently  . Drug use: Never  . Sexual activity: Not on file  Lifestyle  . Physical activity:    Days  per week: Not on file    Minutes per session: Not on file  . Stress: Not on file  Relationships  . Social connections:    Talks on phone: Not on file    Gets together: Not on file    Attends religious service: Not on file    Active member of club or organization: Not on file    Attends meetings of clubs or organizations: Not on file    Relationship status: Not on file  . Intimate partner violence:    Fear of current or ex partner: Not on file    Emotionally abused: Not on file    Physically abused: Not on file    Forced sexual activity: Not on file  Other Topics Concern  . Not on file  Social History Narrative  . Not on file     Physical Exam  Vital Signs and Nursing Notes reviewed Vitals:   09/30/17 1325 09/30/17 1516  BP: (!) 159/109 (!) 144/105  Pulse: (!) 109 (!) 108  Resp: (!) 22 20  Temp:    SpO2: 96% 94%    CONSTITUTIONAL: Well-appearing, NAD NEURO:  Alert and oriented x 3, no focal deficits EYES:  eyes equal and reactive ENT/NECK:  no LAD, no JVD CARDIO: Regular rate, well-perfused, normal S1 and S2 PULM:  CTAB no  wheezing or rhonchi GI/GU:  normal bowel sounds, non-distended, non-tender MSK/SPINE:  No gross deformities, no edema SKIN:  no rash, atraumatic PSYCH:  Appropriate speech and behavior  Diagnostic and Interventional Summary    EKG Interpretation  Date/Time:  Tuesday September 30 2017 11:52:52 EDT Ventricular Rate:  105 PR Interval:    QRS Duration: 103 QT Interval:  360 QTC Calculation: 476 R Axis:   -26 Text Interpretation:  Sinus tachycardia Multiform ventricular premature complexes Consider right atrial enlargement Borderline left axis deviation Borderline low voltage, extremity leads Borderline prolonged QT interval Confirmed by Gerlene Fee [phone]) on 09/30/2017 3:09:25 PM      Labs Reviewed  BASIC METABOLIC PANEL - Abnormal; Notable for the following components:      Result Value   Glucose, Bld 284 (*)    All other components  within normal limits  CBC - Abnormal; Notable for the following components:   WBC 11.4 (*)    RBC 5.94 (*)    MCV 77.4 (*)    All other components within normal limits  CBG MONITORING, ED - Abnormal; Notable for the following components:   Glucose-Capillary 257 (*)    All other components within normal limits  I-STAT TROPONIN, ED    DG Chest 2 View  Final Result      Medications  gi cocktail (Maalox,Lidocaine,Donnatal) (30 mLs Oral Given 09/30/17 1528)     Procedures Critical Care  ED Course and Medical Decision Making  I have reviewed the triage vital signs and the nursing notes.  Pertinent labs & imaging results that were available during my care of the patient were reviewed by me and considered in my medical decision making (see below for details).    Burning chest pain consistent with GERD in this 52 year old male.  Not consistent with ACS, EKG on concerning, troponin negative.  Patient has cardiology follow-up tomorrow, plans to establish with PCP within the next week.  Will provide GI cocktail here, prescription for Carafate.  After the discussed management above, the patient was determined to be safe for discharge.  The patient was in agreement with this plan and all questions regarding their care were answered.  ED return precautions were discussed and the patient will return to the ED with any significant worsening of condition.  [name]. [name], [place] [email or web address]  Final Clinical Impressions(s) / ED Diagnoses     ICD-10-CM   1. Gastroesophageal reflux disease, esophagitis presence not specified K21.9     ED Discharge Orders         Ordered    sucralfate (CARAFATE) 1 g tablet  4 times daily PRN     09/30/17 1556             [name], MD 09/30/17 2148

## 2017-09-30 NOTE — Discharge Instructions (Addendum)
You were evaluated in the Emergency Department and after careful evaluation, we did not find any emergent condition requiring admission or further testing in the hospital.  Your symptoms today seem to be due to acid reflux.  Please continue to take your Prilosec medication at home and try the medication provided today as needed for pain.  Please return to the Emergency Department if you experience any worsening of your condition.  We encourage you to follow up with a primary care provider.  Thank you for allowing [place] to be a part of your care.

## 2017-09-30 NOTE — ED Triage Notes (Signed)
Pt was seen couple days ago for heartburn and given GI cocktails and seemed to help. reports yesterday he was fine but today chest burning back again.

## 2017-10-02 NOTE — Progress Notes (Deleted)
Cardiology Office Note   Date:  10/02/2017   ID:  [name], DOB 1965/01/28, MRN [id number]  PCP:  Patient, No Pcp Per  Cardiologist:   No primary care provider on file. Referring:  ***  No chief complaint on file.     History of Present Illness: Bryan English is a 53 y.o. male who was referred by *** for evaluation of chest pain.  He has been seen in the ED 3 times in the last week.  I reviewed these records for this visit.  .   On 9/14 he had chest pain.   He was not found to have any evidence for ischemia.  In a previous visit in August he had had negative CT for PE.  He did have coronary calcification in the LAD.   ***   Past Medical History:  Diagnosis Date  . Hypertension     No past surgical history on file.   Current Outpatient Medications  Medication Sig Dispense Refill  . albuterol (PROVENTIL HFA;VENTOLIN HFA) 108 (90 Base) MCG/ACT inhaler Inhale 1-2 puffs into the lungs every 6 (six) hours as needed for wheezing or shortness of breath. 1 Inhaler 2  . azithromycin (ZITHROMAX) 250 MG tablet Take 1 tablet (250 mg total) by mouth daily. Take first 2 tablets together, then 1 every day until finished. 6 tablet 0  . metFORMIN (GLUCOPHAGE) 1000 MG tablet Take 1 tablet (1,000 mg total) by mouth every evening. 30 tablet 2  . ondansetron (ZOFRAN ODT) 4 MG disintegrating tablet Take 1 tablet (4 mg total) by mouth every 8 (eight) hours as needed for nausea or vomiting. 10 tablet 0  . predniSONE (DELTASONE) 10 MG tablet Take 4 tablets (40 mg total) by mouth daily. 20 tablet 0  . sucralfate (CARAFATE) 1 g tablet Take 1 tablet (1 g total) by mouth 4 (four) times daily as needed. 30 tablet 0   No current facility-administered medications for this visit.     Allergies:   Patient has no known allergies.    Social History:  The patient  reports that he has never smoked. He has never used smokeless tobacco. He reports that he drank alcohol. He reports that he does not use  drugs.   Family History:  The patient's ***family history is not on file.    ROS:  Please see the history of present illness.   Otherwise, review of systems are positive for {NONE DEFAULTED:18576::"none"}.   All other systems are reviewed and negative.    PHYSICAL EXAM: VS:  There were no vitals taken for this visit. , BMI There is no height or weight on file to calculate BMI. GENERAL:  Well appearing HEENT:  Pupils equal round and reactive, fundi not visualized, oral mucosa unremarkable NECK:  No jugular venous distention, waveform within normal limits, carotid upstroke brisk and symmetric, no bruits, no thyromegaly LYMPHATICS:  No cervical, inguinal adenopathy LUNGS:  Clear to auscultation bilaterally BACK:  No CVA tenderness CHEST:  Unremarkable HEART:  PMI not displaced or sustained,S1 and S2 within normal limits, no S3, no S4, no clicks, no rubs, *** murmurs ABD:  Flat, positive bowel sounds normal in frequency in pitch, no bruits, no rebound, no guarding, no midline pulsatile mass, no hepatomegaly, no splenomegaly EXT:  2 plus pulses throughout, no edema, no cyanosis no clubbing SKIN:  No rashes no nodules NEURO:  Cranial nerves II through XII grossly intact, motor grossly intact throughout PSYCH:  Cognitively intact, oriented to person place and  time    EKG:  EKG {ACTION; IS/IS PGF:84210312} ordered today. The ekg ordered today demonstrates ***   Recent Labs: 04/05/2017: Magnesium 1.7 09/30/2017: BUN 15; Creatinine, Ser 1.04; Hemoglobin 15.5; Platelets 326; Potassium 4.5; Sodium 138    Lipid Panel No results found for: CHOL, TRIG, HDL, CHOLHDL, VLDL, LDLCALC, LDLDIRECT    Wt Readings from Last 3 Encounters:  09/30/17 280 lb (127 kg)  09/27/17 285 lb (129.3 kg)  09/08/17 285 lb (129.3 kg)      Other studies Reviewed: Additional studies/ records that were reviewed today include: ***. Review of the above records demonstrates:  Please see elsewhere in the note.   ***   ASSESSMENT AND PLAN:  CHEST PAIN:   He has known coronary calcium.  ***  DM:  ***   Current medicines are reviewed at length with the patient today.  The patient {ACTIONS; HAS/DOES NOT HAVE:19233} concerns regarding medicines.  The following changes have been made:  {PLAN; NO CHANGE:13088:s}  Labs/ tests ordered today include: *** No orders of the defined types were placed in this encounter.    Disposition:   FU with ***    Signed, [name], MD  10/02/2017 3:14 PM    [facility]

## 2017-10-03 ENCOUNTER — Ambulatory Visit: Payer: Self-pay | Admitting: Cardiology

## 2017-10-06 ENCOUNTER — Encounter: Payer: Self-pay | Admitting: *Deleted

## 2017-10-10 ENCOUNTER — Encounter (HOSPITAL_COMMUNITY): Payer: Self-pay | Admitting: Emergency Medicine

## 2017-10-10 ENCOUNTER — Other Ambulatory Visit: Payer: Self-pay

## 2017-10-10 ENCOUNTER — Ambulatory Visit (HOSPITAL_COMMUNITY)
Admission: EM | Admit: 2017-10-10 | Discharge: 2017-10-10 | Disposition: A | Discharge status: Home / Self Care | Payer: Self-pay | Attending: Urgent Care | Admitting: Urgent Care

## 2017-10-10 DIAGNOSIS — R739 Hyperglycemia, unspecified: Secondary | ICD-10-CM

## 2017-10-10 DIAGNOSIS — R030 Elevated blood-pressure reading, without diagnosis of hypertension: Secondary | ICD-10-CM

## 2017-10-10 DIAGNOSIS — E1165 Type 2 diabetes mellitus with hyperglycemia: Secondary | ICD-10-CM

## 2017-10-10 DIAGNOSIS — R42 Dizziness and giddiness: Secondary | ICD-10-CM

## 2017-10-10 DIAGNOSIS — I10 Essential (primary) hypertension: Secondary | ICD-10-CM

## 2017-10-10 DIAGNOSIS — R12 Heartburn: Secondary | ICD-10-CM

## 2017-10-10 HISTORY — DX: Type 2 diabetes mellitus without complications: E11.9

## 2017-10-10 MED ORDER — GI COCKTAIL ~~LOC~~
30.0000 mL | Freq: Once | ORAL | Status: AC
  Administered 2017-10-10: 30 mL via ORAL

## 2017-10-10 MED ORDER — RANITIDINE HCL 150 MG PO TABS: 150.0000 mg | ORAL_TABLET | Freq: Two times a day (BID) | ORAL | 0 refills | Status: DC

## 2017-10-10 MED ORDER — GI COCKTAIL ~~LOC~~
ORAL | Status: AC
  Filled 2017-10-10: qty 30

## 2017-10-10 MED ORDER — LISINOPRIL 10 MG PO TABS: 10.0000 mg | ORAL_TABLET | Freq: Every day | ORAL | 0 refills | Status: DC

## 2017-10-10 MED ORDER — METOPROLOL TARTRATE 25 MG PO TABS: 25.0000 mg | ORAL_TABLET | Freq: Two times a day (BID) | ORAL | 0 refills | Status: DC

## 2017-10-10 NOTE — ED Provider Notes (Signed)
MRN: [id number] DOB: [birth year]  Subjective:   Bryan English is a 52 y.o. male presenting for persistent and recurrent heartburn in his throat and a lot of burping.  He has also had some dizziness intermittently.  Reports that his symptoms have come and gone since he started metformin for management of his uncontrolled diabetes.  He denies chest pain, shortness of breath, nausea, vomiting, belly pain.  He has been using Carafate and admits that he has had relief with using GI cocktail he was given in the ER.  Reports that he has made significant dietary modifications and actually has an appointment with a cardiologist in a few days.  He has not yet established care with a PCP.  He has been checking his blood sugar daily and reports that they have ranged between 110-140.  reports that he has never smoked. He has never used smokeless tobacco. He reports that he drank alcohol. He reports that he does not use drugs.   No current facility-administered medications for this encounter.   Current Outpatient Medications:  .  albuterol (PROVENTIL HFA;VENTOLIN HFA) 108 (90 Base) MCG/ACT inhaler, Inhale 1-2 puffs into the lungs every 6 (six) hours as needed for wheezing or shortness of breath., Disp: 1 Inhaler, Rfl: 2 .  metFORMIN (GLUCOPHAGE) 1000 MG tablet, Take 1 tablet (1,000 mg total) by mouth every evening., Disp: 30 tablet, Rfl: 2 .  ondansetron (ZOFRAN ODT) 4 MG disintegrating tablet, Take 1 tablet (4 mg total) by mouth every 8 (eight) hours as needed for nausea or vomiting., Disp: 10 tablet, Rfl: 0 .  sucralfate (CARAFATE) 1 g tablet, Take 1 tablet (1 g total) by mouth 4 (four) times daily as needed., Disp: 30 tablet, Rfl: 0   No Known Allergies  Past Medical History:  Diagnosis Date  . Diabetes mellitus without complication ([place])   . Hypertension     History reviewed. No pertinent surgical history.  Objective:   Vitals: BP (!) 150/103   Pulse (!) 108   Temp 98.1 F (36.7 C)   Resp 20    SpO2 98%   Physical Exam  Constitutional: He is oriented to person, place, and time. He appears well-developed and well-nourished.  Cardiovascular: Normal rate, regular rhythm, normal heart sounds and intact distal pulses. Exam reveals no gallop and no friction rub.  No murmur heard. Pulmonary/Chest: Effort normal and breath sounds normal. No stridor. No respiratory distress. He has no wheezes. He has no rales.  Neurological: He is alert and oriented to person, place, and time.  Psychiatric: He has a normal mood and affect.   ED ECG REPORT   Date: 10/10/2017  Rate: 113bpm  Rhythm: sinus tachycardia  QRS Axis: Strain pattern noted in V1, V2.  Intervals: normal  ST/T Wave abnormalities: Nonspecific T wave flattening at V5, V6, aVL, I.  Conduction Disutrbances:none  Narrative Interpretation: Sinus tachycardia and nonspecific T wave wave changes.  She previously had tachycardia on his ECGs from 09/17-18/2019, had different T wave abnormalities then.  Old EKG Reviewed: changes noted  I have personally reviewed the EKG tracing and agree with the computerized printout as noted.   Assessment and Plan :   Dizziness  Hyperglycemia  Uncontrolled type 2 diabetes mellitus with hyperglycemia (HCC)  Essential hypertension  Elevated blood pressure reading  Morbid obesity ([place])  Heartburn  Counseled patient extensively on management of his diabetes which includes maintaining a healthy diet.  For now given the side effects he is experiencing with metformin, will prescribe Zantac in  addition to his GI cocktail in clinic.  Also given his blood pressure, will have patient start lisinopril 10 mg and counseled letter will also help with renal protection.  Patient is to start metoprolol as well for heart protection and his tachycardia.  Strict ER return to clinic precautions reviewed, patient is to establish care with a PCP for continued management of his chronic conditions. Case discussed/precepted  with Dr. Valere Dross.     Jaynee Eagles, PA-C 10/10/17 1511

## 2017-10-10 NOTE — Discharge Instructions (Addendum)
Please contact [facility] and schedule an appointment for management of diabetes.

## 2017-10-10 NOTE — ED Notes (Signed)
Bed: UC01 Expected date:  Expected time:  Means of arrival:  Comments: 

## 2017-10-10 NOTE — ED Triage Notes (Signed)
Pt states he was seen several times in sept for the same issue, heartburn. In the ER he was prescribed metformin for high blood sugar. Pt states hes felt lightheaded. Still c/o heartburn. Pt states his sugar was 140s today.

## 2017-10-13 NOTE — Progress Notes (Signed)
Cardiology Office Note   Date:  10/14/2017   ID:  Bryan English, DOB January 18, 1965, MRN [id number]  PCP:  Patient, No Pcp Per  Cardiologist:   No primary care provider on file.    Chief Complaint  Patient presents with  . Tachycardia      History of Present Illness: Bryan English is a 52 y.o. male who was referred by himself for evaluation of chest pain.  He has been seen in the ED 4 times in the last month.  I reviewed these records for this visit.  .   On 9/14 he had chest pain.   He was not found to have any evidence for ischemia.  In a previous visit in August he had had negative CT for PE.  He did have coronary calcification in the LAD.     He believes that his discomfort was related to GI and reflux and it has gotten better since he changed his diet.  However, he has been noted to have increased heart rate.  I looked back at vital recordings and he is heart rate is anywhere from 103-108.  He says it at rest is typically in the 80s to 90s.  His blood pressure has been elevated and he is now being treated with lisinopril.  He was also started on a beta-blocker although he stopped taking this when he read the potential side effects including heart block.  He is also recently been diagnosed with diabetes.  He is taking medications for this now and he is changed his diet.  He is actually lost weight of at least 15 pounds in the last few weeks.  He has not been exercising because he was told not to.  However, prior to this he was not having any chest pressure, neck or arm discomfort.  He was not having any palpitations, presyncope or syncope.  He has no PND or orthopnea.    Past Medical History:  Diagnosis Date  . Diabetes mellitus without complication ([place])   . Hypertension     History reviewed. No pertinent surgical history.   Current Outpatient Medications  Medication Sig Dispense Refill  . albuterol (PROVENTIL HFA;VENTOLIN HFA) 108 (90 Base) MCG/ACT inhaler Inhale 1-2  puffs into the lungs every 6 (six) hours as needed for wheezing or shortness of breath. 1 Inhaler 2  . lisinopril (PRINIVIL,ZESTRIL) 10 MG tablet Take 1 tablet (10 mg total) by mouth daily. 90 tablet 0  . metFORMIN (GLUCOPHAGE) 1000 MG tablet Take 1 tablet (1,000 mg total) by mouth every evening. 30 tablet 2  . metoprolol tartrate (LOPRESSOR) 25 MG tablet Take 1 tablet (25 mg total) by mouth 2 (two) times daily. 60 tablet 0   No current facility-administered medications for this visit.     Allergies:   Patient has no known allergies.    Social History:  The patient  reports that he has never smoked. He has never used smokeless tobacco. He reports that he drank alcohol. He reports that he does not use drugs.   Family History:  The patient's Family history is unknown by patient.   No early CAD.    ROS:  Please see the history of present illness.   Otherwise, review of systems are positive for none.   All other systems are reviewed and negative.    PHYSICAL EXAM: VS:  BP (!) 146/90 (BP Location: Right Arm, Patient Position: Sitting, Cuff Size: Large)   Pulse (!) 103   Ht 5\' 8"  (1.727  m)   Wt 270 lb 12.8 oz (122.8 kg)   BMI 41.17 kg/m  , BMI Body mass index is 41.17 kg/m. GENERAL:  Well appearing HEENT:  Pupils equal round and reactive, fundi not visualized, oral mucosa unremarkable NECK:  No jugular venous distention, waveform within normal limits, carotid upstroke brisk and symmetric, no bruits, no thyromegaly LYMPHATICS:  No cervical, inguinal adenopathy LUNGS:  Clear to auscultation bilaterally BACK:  No CVA tenderness CHEST:  Unremarkable HEART:  PMI not displaced or sustained,S1 and S2 within normal limits, no S3, no S4, no clicks, no rubs, no murmurs ABD:  Flat, positive bowel sounds normal in frequency in pitch, no bruits, no rebound, no guarding, no midline pulsatile mass, no hepatomegaly, no splenomegaly EXT:  2 plus pulses throughout, no edema, no cyanosis no  clubbing SKIN:  No rashes no nodules NEURO:  Cranial nerves II through XII grossly intact, motor grossly intact throughout PSYCH:  Cognitively intact, oriented to person place and time    EKG:  EKG is ordered today. The ekg ordered today demonstrates sinus rhythm, rate 103, axis within normal limits, borderline interventricular conduction delay, left atrial and right atrial lead enlargement, nonspecific T wave flattening.   Recent Labs: 04/05/2017: Magnesium 1.7 09/30/2017: BUN 15; Creatinine, Ser 1.04; Hemoglobin 15.5; Platelets 326; Potassium 4.5; Sodium 138    Lipid Panel No results found for: CHOL, TRIG, HDL, CHOLHDL, VLDL, LDLCALC, LDLDIRECT    Wt Readings from Last 3 Encounters:  10/14/17 270 lb 12.8 oz (122.8 kg)  09/30/17 280 lb (127 kg)  09/27/17 285 lb (129.3 kg)      Other studies Reviewed: Additional studies/ records that were reviewed today include: ED records. Review of the above records demonstrates:  Please see elsewhere in the note.     ASSESSMENT AND PLAN:  CHEST PAIN:   He has known coronary calcium.  At present he is not having chest discomfort.  I likely will screen him with stress testing in the future but would like to understand the other results first.  DM: This is a new diagnosis.  We talked about diet and exercise.  He is not able to participate in a full exercise regimen until I seen him further and managed him and done the testing as described.  TACHYCARDIA: I am going to start with an echocardiogram and a TSH.  I have encouraged him to start back his metoprolol.  Further work-up and med titration will be based on the results of this.  Current medicines are reviewed at length with the patient today.  The patient does not have concerns regarding medicines.  The following changes have been made:  As above.   Labs/ tests ordered today include: None  Orders Placed This Encounter  Procedures  . TSH  . EKG 12-Lead  . ECHOCARDIOGRAM COMPLETE      Disposition:   FU with in one month.     Signed, [name], MD  10/14/2017 5:24 PM    [place]

## 2017-10-14 ENCOUNTER — Ambulatory Visit (INDEPENDENT_AMBULATORY_CARE_PROVIDER_SITE_OTHER): Payer: Self-pay | Admitting: Cardiology

## 2017-10-14 ENCOUNTER — Encounter: Payer: Self-pay | Admitting: Cardiology

## 2017-10-14 VITALS — BP 146/90 | HR 103 | Ht 68.0 in | Wt 270.8 lb

## 2017-10-14 DIAGNOSIS — R000 Tachycardia, unspecified: Secondary | ICD-10-CM | POA: Insufficient documentation

## 2017-10-14 DIAGNOSIS — R931 Abnormal findings on diagnostic imaging of heart and coronary circulation: Secondary | ICD-10-CM | POA: Insufficient documentation

## 2017-10-14 DIAGNOSIS — I10 Essential (primary) hypertension: Secondary | ICD-10-CM | POA: Insufficient documentation

## 2017-10-14 DIAGNOSIS — Z79899 Other long term (current) drug therapy: Secondary | ICD-10-CM

## 2017-10-14 MED ORDER — METOPROLOL TARTRATE 25 MG PO TABS: 25.0000 mg | ORAL_TABLET | Freq: Two times a day (BID) | ORAL | 0 refills | Status: DC

## 2017-10-14 NOTE — Patient Instructions (Signed)
Medication Instructions:  RESTART- Metoprolol Tartrate 25 mg twice a day   If you need a refill on your cardiac medications before your next appointment, please call your pharmacy.  Labwork: TSH Today HERE IN OUR OFFICE AT [facility]  Take the provided lab slips with you to the lab for your blood draw.    You will NOT need to fast   Testing/Procedures: Your physician has requested that you have an echocardiogram. Echocardiography is a painless test that uses sound waves to create images of your heart. It provides your doctor with information about the size and shape of your heart and how well your heart's chambers and valves are working. This procedure takes approximately one hour. There are no restrictions for this procedure.   Follow-Up: Your physician wants you to follow-up in: As Needed.       Thank you for choosing [facility] at [facility]!!

## 2017-10-15 LAB — TSH: TSH: 1.6 u[IU]/mL (ref 0.450–4.500)

## 2017-10-17 ENCOUNTER — Other Ambulatory Visit: Payer: Self-pay

## 2017-10-17 ENCOUNTER — Emergency Department (HOSPITAL_BASED_OUTPATIENT_CLINIC_OR_DEPARTMENT_OTHER)
Admission: EM | Admit: 2017-10-17 | Discharge: 2017-10-17 | Disposition: A | Discharge status: Home / Self Care | Payer: Self-pay | Attending: Emergency Medicine | Admitting: Emergency Medicine

## 2017-10-17 ENCOUNTER — Telehealth: Payer: Self-pay | Admitting: Cardiology

## 2017-10-17 ENCOUNTER — Emergency Department (HOSPITAL_BASED_OUTPATIENT_CLINIC_OR_DEPARTMENT_OTHER): Payer: Self-pay

## 2017-10-17 ENCOUNTER — Encounter (HOSPITAL_BASED_OUTPATIENT_CLINIC_OR_DEPARTMENT_OTHER): Payer: Self-pay | Admitting: Emergency Medicine

## 2017-10-17 DIAGNOSIS — Z7984 Long term (current) use of oral hypoglycemic drugs: Secondary | ICD-10-CM | POA: Insufficient documentation

## 2017-10-17 DIAGNOSIS — R079 Chest pain, unspecified: Secondary | ICD-10-CM

## 2017-10-17 DIAGNOSIS — E119 Type 2 diabetes mellitus without complications: Secondary | ICD-10-CM | POA: Insufficient documentation

## 2017-10-17 DIAGNOSIS — Z79899 Other long term (current) drug therapy: Secondary | ICD-10-CM | POA: Insufficient documentation

## 2017-10-17 DIAGNOSIS — R0789 Other chest pain: Secondary | ICD-10-CM

## 2017-10-17 DIAGNOSIS — I10 Essential (primary) hypertension: Secondary | ICD-10-CM | POA: Insufficient documentation

## 2017-10-17 LAB — CBC WITH DIFFERENTIAL/PLATELET
Basophils Absolute: 0 10*3/uL (ref 0.0–0.1)
Basophils Relative: 0 %
EOS ABS: 0.2 10*3/uL (ref 0.0–0.7)
Eosinophils Relative: 2 %
HEMATOCRIT: 44.5 % (ref 39.0–52.0)
Hemoglobin: 15 g/dL (ref 13.0–17.0)
LYMPHS ABS: 4.1 10*3/uL — AB (ref 0.7–4.0)
Lymphocytes Relative: 37 %
MCH: 25.3 pg — ABNORMAL LOW (ref 26.0–34.0)
MCHC: 33.7 g/dL (ref 30.0–36.0)
MCV: 74.9 fL — ABNORMAL LOW (ref 78.0–100.0)
MONO ABS: 1 10*3/uL (ref 0.1–1.0)
MONOS PCT: 9 %
Neutro Abs: 5.8 10*3/uL (ref 1.7–7.7)
Neutrophils Relative %: 52 %
PLATELETS: 299 10*3/uL (ref 150–400)
RBC: 5.94 MIL/uL — AB (ref 4.22–5.81)
RDW: 13.8 % (ref 11.5–15.5)
WBC: 11.1 10*3/uL — AB (ref 4.0–10.5)

## 2017-10-17 LAB — COMPREHENSIVE METABOLIC PANEL
ALBUMIN: 4.1 g/dL (ref 3.5–5.0)
ALT: 26 U/L (ref 0–44)
ANION GAP: 13 (ref 5–15)
AST: 16 U/L (ref 15–41)
Alkaline Phosphatase: 64 U/L (ref 38–126)
BILIRUBIN TOTAL: 0.6 mg/dL (ref 0.3–1.2)
BUN: 22 mg/dL — ABNORMAL HIGH (ref 6–20)
CO2: 18 mmol/L — AB (ref 22–32)
Calcium: 9.4 mg/dL (ref 8.9–10.3)
Chloride: 105 mmol/L (ref 98–111)
Creatinine, Ser: 1.18 mg/dL (ref 0.61–1.24)
GFR calc Af Amer: >60 mL/min (ref >60)
GFR calc non Af Amer: >60 mL/min (ref >60)
GLUCOSE: 176 mg/dL — AB (ref 70–99)
POTASSIUM: 3.6 mmol/L (ref 3.5–5.1)
Sodium: 136 mmol/L (ref 135–145)
TOTAL PROTEIN: 8.1 g/dL (ref 6.5–8.1)

## 2017-10-17 LAB — TROPONIN I: Troponin I: <0.03 ng/mL (ref <0.03)

## 2017-10-17 MED ORDER — PANTOPRAZOLE SODIUM 20 MG PO TBEC: 40.0000 mg | DELAYED_RELEASE_TABLET | Freq: Every day | ORAL | 0 refills | Status: DC

## 2017-10-17 MED ORDER — ASPIRIN 81 MG PO CHEW
324.0000 mg | CHEWABLE_TABLET | Freq: Once | ORAL | Status: AC
  Administered 2017-10-17: 324 mg via ORAL
  Filled 2017-10-17: qty 4

## 2017-10-17 MED ORDER — GI COCKTAIL ~~LOC~~
30.0000 mL | Freq: Once | ORAL | Status: AC
  Administered 2017-10-17: 30 mL via ORAL
  Filled 2017-10-17: qty 30

## 2017-10-17 NOTE — Telephone Encounter (Signed)
New message  Per patient's nephew states that the patient was in the Emergency room with chest pain and acid reflux on 10/17/2017.

## 2017-10-17 NOTE — ED Notes (Signed)
ED Provider at bedside. 

## 2017-10-17 NOTE — Telephone Encounter (Signed)
He needs follow up per my previous note.  Call [name] with the results

## 2017-10-17 NOTE — ED Notes (Signed)
No pain at this time.  Felt like a gas pain that is relieved with belching/burping.

## 2017-10-17 NOTE — ED Triage Notes (Signed)
Pt presents with c/o let upper chest pain that started about an hour ago and woke him.

## 2017-10-17 NOTE — ED Notes (Signed)
Pt on auto VS and cardiac monitor. 

## 2017-10-17 NOTE — ED Provider Notes (Signed)
[facility] EMERGENCY DEPARTMENT Provider Note   CSN: [id number] Arrival date & time: 10/17/17  0705     History   Chief Complaint Chief Complaint  Patient presents with  . Chest Pain    HPI Bryan English is a 52 y.o. male.  HPI  Presents with concern for chest pain Felt like burning up, belching, burning in center of chest without radiation.  Woke up this AM with the discomfort. Ate chili last night. Lasted an hour. Now not having any discomfort.  Feels like a gas pain that feels better after belching.  Taking medication he got at urgent care, sucralfate which helped some, other didn't help. 9/27.  These symptoms started after taking metformin. Not worse with exertion. Not worse with eating. No shortness of breath, no nausea, no vomiting, no sweating. This am had severe discomfort and was trying to burp and had sweating at that time.  Does feel he has gas from metformin, just diagnosed with DM, feels anxiety  Saw Cardiology just last week, Dr. Percival Spanish No family hx CAD, no drugs, no smoking  No hx of dvt/pe, no recent surgeries, no leg pain or swelling or long trips  Feels like having symptoms since starting metformin, burning in chest that comes every day, saw Dr. Percival Spanish 3 days ago, wanted to do outpt ECHO to begin with  Put on metoprolol, and blood pressure medicine 9/27, haven't had fast heart rate since starting these medications  Past Medical History:  Diagnosis Date  . Diabetes mellitus without complication ([place])   . Hypertension     Patient Active Problem List   Diagnosis Date Noted  . 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 Hypertension 10/14/2017  . Medication management 10/14/2017  . Tachycardia 10/14/2017    History reviewed. No pertinent surgical history.      Home Medications    Prior to Admission medications   Medication Sig Start Date End Date Taking? Authorizing Provider  albuterol (PROVENTIL HFA;VENTOLIN HFA) 108  (90 Base) MCG/ACT inhaler Inhale 1-2 puffs into the lungs every 6 (six) hours as needed for wheezing or shortness of breath. 09/27/17   Fredia Sorrow, MD  lisinopril (PRINIVIL,ZESTRIL) 10 MG tablet Take 1 tablet (10 mg total) by mouth daily. 10/10/17   Jaynee Eagles, PA-C  metFORMIN (GLUCOPHAGE) 1000 MG tablet Take 1 tablet (1,000 mg total) by mouth every evening. 09/27/17   Fredia Sorrow, MD  metoprolol tartrate (LOPRESSOR) 25 MG tablet Take 1 tablet (25 mg total) by mouth 2 (two) times daily. 10/14/17   [name], MD  pantoprazole (PROTONIX) 20 MG tablet Take 2 tablets (40 mg total) by mouth daily. 10/17/17   Gareth Morgan, MD    Family History Family History  Family history unknown: Yes    Social History Social History   Tobacco Use  . Smoking status: Never Smoker  . Smokeless tobacco: Never Used  Substance Use Topics  . Alcohol use: Not Currently  . Drug use: Never     Allergies   Patient has no known allergies.   Review of Systems Review of Systems  Constitutional: Negative for fever.  HENT: Negative for sore throat.   Eyes: Negative for visual disturbance.  Respiratory: Negative for shortness of breath.   Cardiovascular: Positive for chest pain.  Gastrointestinal: Negative for abdominal pain, nausea and vomiting.  Genitourinary: Negative for difficulty urinating.  Musculoskeletal: Negative for back pain and neck stiffness.  Skin: Negative for rash.  Neurological: Negative for syncope and headaches.  Physical Exam Updated Vital Signs BP 127/86   Pulse (!) 109   Temp 97.9 F (36.6 C) (Oral)   Resp (!) 22   Ht 5\' 8"  (1.727 m)   Wt 122.5 kg   SpO2 98%   BMI 41.05 kg/m   Physical Exam  Constitutional: He is oriented to person, place, and time. He appears well-developed and well-nourished. No distress.  HENT:  Head: Normocephalic and atraumatic.  Eyes: Conjunctivae and EOM are normal.  Neck: Normal range of motion.  Cardiovascular: Normal rate,  regular rhythm, normal heart sounds and intact distal pulses. Exam reveals no gallop and no friction rub.  No murmur heard. Pulmonary/Chest: Effort normal and breath sounds normal. No respiratory distress. He has no wheezes. He has no rales.  Abdominal: Soft. He exhibits no distension. There is no tenderness. There is no guarding.  Musculoskeletal: He exhibits no edema.  Neurological: He is alert and oriented to person, place, and time.  Skin: Skin is warm and dry. He is not diaphoretic.  Nursing note and vitals reviewed.    ED Treatments / Results  Labs (all labs ordered are listed, but only abnormal results are displayed) Labs Reviewed  CBC WITH DIFFERENTIAL/PLATELET - Abnormal; Notable for the following components:      Result Value   WBC 11.1 (*)    RBC 5.94 (*)    MCV 74.9 (*)    MCH 25.3 (*)    Lymphs Abs 4.1 (*)    All other components within normal limits  COMPREHENSIVE METABOLIC PANEL - Abnormal; Notable for the following components:   CO2 18 (*)    Glucose, Bld 176 (*)    BUN 22 (*)    All other components within normal limits  TROPONIN I  TROPONIN I    EKG EKG Interpretation  Date/Time:  Friday October 17 2017 07:12:51 EDT Ventricular Rate:  110 PR Interval:    QRS Duration: 108 QT Interval:  341 QTC Calculation: 462 R Axis:   -11 Text Interpretation:  Sinus tachycardia Ventricular premature complex Consider right atrial enlargement Low voltage, extremity leads No significant change since last tracing Confirmed by Gareth Morgan [phone]) on 10/17/2017 7:16:40 AM   Radiology Dg Chest 2 View  Result Date: 10/17/2017 CLINICAL DATA:  Chest pain and hypertension EXAM: CHEST - 2 VIEW COMPARISON:  September 30, 2017 FINDINGS: Lungs are clear. Heart size and pulmonary vascularity are normal. No adenopathy. No pneumothorax. No bone lesions. IMPRESSION: No edema or consolidation. Electronically Signed   By: Lowella Grip III M.D.   On: 10/17/2017 07:48     Procedures Procedures (including critical care time)  Medications Ordered in ED Medications  aspirin chewable tablet 324 mg (324 mg Oral Given 10/17/17 0752)  gi cocktail (Maalox,Lidocaine,Donnatal) (30 mLs Oral Given 10/17/17 0946)     Initial Impression / Assessment and Plan / ED Course  I have reviewed the triage vital signs and the nursing notes.  Pertinent labs & imaging results that were available during my care of the patient were reviewed by me and considered in my medical decision making (see chart for details).    52 year old male with a history of hypertension, diabetes, high coronary calcium who presents with concern for burning pain in the chest and palpitations.  EKG was evaluate me and shows no signs of pericarditis or acute ST changes.  Chest x-ray shows no signs of pneumonia or pneumothorax.  It is noted that he has had tachycardia on multiple previous visits, and has seen  cardiology regarding this, and tachycardia improves with patient relaxing in the emergency department.  He has no dyspnea no hypoxia, no PE risk factors and doubt PE. Hx not suggestive of dissection.   Delta troponins negative.  Discussed with patient given his risk factors, it is reasonable to observe him in the hospital, however given that he had been seen by cardiology 3 days prior, does not have increasing symptoms, no exertional symptoms, and history is suggestive of other etiology of chest pain, feel that is reasonable to continue his outpatient cardiology work-up as had been initiated.  Patient prefers continue outpatient management.  Given the timing of his symptoms of dyspepsia and discomfort beginning after metformin, is possible that these are side effects.  We will treat him with PPI and recommend cardiology follow-up.  Discussed reasons to return to the emergency department in detail.  Final Clinical Impressions(s) / ED Diagnoses   Final diagnoses:  Chest pain, unspecified type  Burning  chest pain    ED Discharge Orders         Ordered    pantoprazole (PROTONIX) 20 MG tablet  Daily     10/17/17 1110           Gareth Morgan, MD 10/17/17 1121

## 2017-10-20 ENCOUNTER — Emergency Department (HOSPITAL_BASED_OUTPATIENT_CLINIC_OR_DEPARTMENT_OTHER)
Admission: EM | Admit: 2017-10-20 | Discharge: 2017-10-20 | Disposition: A | Discharge status: Home / Self Care | Payer: Self-pay | Attending: Emergency Medicine | Admitting: Emergency Medicine

## 2017-10-20 ENCOUNTER — Encounter (HOSPITAL_BASED_OUTPATIENT_CLINIC_OR_DEPARTMENT_OTHER): Payer: Self-pay | Admitting: Emergency Medicine

## 2017-10-20 ENCOUNTER — Other Ambulatory Visit: Payer: Self-pay

## 2017-10-20 DIAGNOSIS — Z79899 Other long term (current) drug therapy: Secondary | ICD-10-CM | POA: Insufficient documentation

## 2017-10-20 DIAGNOSIS — R202 Paresthesia of skin: Secondary | ICD-10-CM

## 2017-10-20 DIAGNOSIS — E119 Type 2 diabetes mellitus without complications: Secondary | ICD-10-CM | POA: Insufficient documentation

## 2017-10-20 DIAGNOSIS — R110 Nausea: Secondary | ICD-10-CM | POA: Insufficient documentation

## 2017-10-20 DIAGNOSIS — I10 Essential (primary) hypertension: Secondary | ICD-10-CM | POA: Insufficient documentation

## 2017-10-20 DIAGNOSIS — Z7984 Long term (current) use of oral hypoglycemic drugs: Secondary | ICD-10-CM | POA: Insufficient documentation

## 2017-10-20 LAB — BASIC METABOLIC PANEL
Anion gap: 12 (ref 5–15)
BUN: 15 mg/dL (ref 6–20)
CO2: 24 mmol/L (ref 22–32)
CREATININE: 0.99 mg/dL (ref 0.61–1.24)
Calcium: 9.3 mg/dL (ref 8.9–10.3)
Chloride: 102 mmol/L (ref 98–111)
GFR calc Af Amer: >60 mL/min (ref >60)
GLUCOSE: 148 mg/dL — AB (ref 70–99)
Potassium: 3.5 mmol/L (ref 3.5–5.1)
SODIUM: 138 mmol/L (ref 135–145)

## 2017-10-20 LAB — CBG MONITORING, ED: GLUCOSE-CAPILLARY: 139 mg/dL — AB (ref 70–99)

## 2017-10-20 MED ORDER — LORAZEPAM 1 MG PO TABS
1.0000 mg | ORAL_TABLET | Freq: Once | ORAL | Status: AC
  Administered 2017-10-20: 1 mg via ORAL
  Filled 2017-10-20: qty 1

## 2017-10-20 NOTE — Discharge Instructions (Addendum)
He was seen today for numbness.  This could be related to anxiety.  Your work-up is reassuring including her glucose and metabolic studies.  Follow-up with your primary doctor.

## 2017-10-20 NOTE — Telephone Encounter (Signed)
Spoke with pt and asked about his ED visit advised to I can make an appt if he want to come back in and see Dr Percival Spanish, because Dr Warren Lacy last office note is as needed, pt stated he's doing fine and does not need to f/u at this time. Let pt know we are here if he need [place].

## 2017-10-20 NOTE — ED Triage Notes (Signed)
Pt reports numbness in his hands and feet  Pt also states he has nausea  Pt states his sxs started about 30 minutes ago

## 2017-10-20 NOTE — ED Provider Notes (Signed)
[facility] EMERGENCY DEPARTMENT Provider Note   CSN: [id number] Arrival date & time: 10/20/17  0129     History   Chief Complaint Chief Complaint  Patient presents with  . Numbness  . Nausea    HPI Bryan English is a 52 y.o. male.  HPI  This is a 52 year old male with a history of diabetes and hypertension who presents with numbness.  Patient reports onset of tingling in the bilateral hands, bilateral legs, and periorally with approximately 30 minutes prior to evaluation.  Patient reports that he was just laying in bed.  He states that he did not particularly feel anxious but at times does feel that he has issues with anxiety.  No recent stressors.  He denies any recent changes in medication.  He has been taking his medications as prescribed.  Denies any recent illnesses, fever, shortness of breath, chest pain, abdominal pain, nausea, vomiting, focal weakness or numbness, speech difficulty.  Past Medical History:  Diagnosis Date  . Diabetes mellitus without complication ([place])   . Hypertension     Patient Active Problem List   Diagnosis Date Noted  . 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 Hypertension 10/14/2017  . Medication management 10/14/2017  . Tachycardia 10/14/2017    History reviewed. No pertinent surgical history.      Home Medications    Prior to Admission medications   Medication Sig Start Date End Date Taking? Authorizing Provider  albuterol (PROVENTIL HFA;VENTOLIN HFA) 108 (90 Base) MCG/ACT inhaler Inhale 1-2 puffs into the lungs every 6 (six) hours as needed for wheezing or shortness of breath. 09/27/17   Fredia Sorrow, MD  lisinopril (PRINIVIL,ZESTRIL) 10 MG tablet Take 1 tablet (10 mg total) by mouth daily. 10/10/17   Jaynee Eagles, PA-C  metFORMIN (GLUCOPHAGE) 1000 MG tablet Take 1 tablet (1,000 mg total) by mouth every evening. 09/27/17   Fredia Sorrow, MD  metoprolol tartrate (LOPRESSOR) 25 MG tablet Take 1 tablet (25 mg  total) by mouth 2 (two) times daily. 10/14/17   [name], MD  pantoprazole (PROTONIX) 20 MG tablet Take 2 tablets (40 mg total) by mouth daily. 10/17/17   Gareth Morgan, MD    Family History Family History  Family history unknown: Yes    Social History Social History   Tobacco Use  . Smoking status: Never Smoker  . Smokeless tobacco: Never Used  Substance Use Topics  . Alcohol use: Not Currently  . Drug use: Never     Allergies   Patient has no known allergies.   Review of Systems Review of Systems  Constitutional: Negative for fever.  Respiratory: Negative for shortness of breath.   Cardiovascular: Negative for chest pain.  Gastrointestinal: Negative for abdominal pain, nausea and vomiting.  Genitourinary: Negative for dysuria.  Neurological: Positive for numbness. Negative for dizziness, weakness and headaches.  All other systems reviewed and are negative.    Physical Exam Updated Vital Signs BP (!) 144/100 (BP Location: Right Arm)   Pulse 98   Temp 98.3 F (36.8 C) (Oral)   Resp 15   Ht 1.74 m (5' 8.5")   Wt 122.5 kg   SpO2 97%   BMI 40.46 kg/m   Physical Exam  Constitutional: He is oriented to person, place, and time. He appears well-developed and well-nourished.  Obese, no acute distress  HENT:  Head: Normocephalic and atraumatic.  Eyes: Pupils are equal, round, and reactive to light.  Cardiovascular: Normal rate, regular rhythm and normal heart sounds.  No  murmur heard. Pulmonary/Chest: Effort normal and breath sounds normal. No respiratory distress. He has no wheezes.  Abdominal: Soft. Bowel sounds are normal. There is no tenderness. There is no rebound.  Musculoskeletal: He exhibits no edema.  Neurological: He is alert and oriented to person, place, and time.  Fluent speech, cranial nerves II through XII intact, 5 out of 5 strength in all 4 extremities, no dysmetria to finger-nose-finger, sensation grossly intact  Skin: Skin is warm and  dry.  Psychiatric: He has a normal mood and affect.  Nursing note and vitals reviewed.    ED Treatments / Results  Labs (all labs ordered are listed, but only abnormal results are displayed) Labs Reviewed  BASIC METABOLIC PANEL - Abnormal; Notable for the following components:      Result Value   Glucose, Bld 148 (*)    All other components within normal limits  CBG MONITORING, ED - Abnormal; Notable for the following components:   Glucose-Capillary 139 (*)    All other components within normal limits    EKG None  Radiology No results found.  Procedures Procedures (including critical care time)  Medications Ordered in ED Medications  LORazepam (ATIVAN) tablet 1 mg (1 mg Oral Given 10/20/17 0209)     Initial Impression / Assessment and Plan / ED Course  I have reviewed the triage vital signs and the nursing notes.  Pertinent labs & imaging results that were available during my care of the patient were reviewed by me and considered in my medical decision making (see chart for details).     Patient presents with numbness and tingling in the bilateral hands, legs, periorally.  He is overall nontoxic-appearing.  Vital signs notable for blood pressure 144/100.  He is neurologically intact.  His exam is benign.  Distribution is not suggestive of stroke and his sensory exam is largely intact.  He does not particularly appear anxious but does report some anxiety symptoms in the past.  Blood glucose is 139.  No metabolic derangements on basic metabolic panel.  Patient was given a dose of Ativan.  Doubt acute emergent process.  After history, exam, and medical workup I feel the patient has been appropriately medically screened and is safe for discharge home. Pertinent diagnoses were discussed with the patient. Patient was given return precautions.   Final Clinical Impressions(s) / ED Diagnoses   Final diagnoses:  Paresthesia    ED Discharge Orders    None       Horton,  Barbette Hair, MD 10/20/17 [phone]

## 2017-10-22 ENCOUNTER — Ambulatory Visit (HOSPITAL_COMMUNITY): Payer: Self-pay | Attending: Cardiovascular Disease

## 2017-10-22 ENCOUNTER — Other Ambulatory Visit: Payer: Self-pay

## 2017-10-22 DIAGNOSIS — R000 Tachycardia, unspecified: Secondary | ICD-10-CM | POA: Insufficient documentation

## 2017-10-22 NOTE — Progress Notes (Signed)
Patient ID: Bryan English, male   DOB: [birth year], 52 y.o.   MRN: [id number]           Bryan English, is a 52 y.o. male  [id number]  [id number]  DOB - [birth year]  Subjective:  Chief Complaint and HPI: Bryan English is a 52 y.o. male here today to establish care and for a follow up visit After being seen in the ED 10/20/2017 for paresthesias.  He had also been diagnosed with diabetes and started on metformin 1000mg  at bedtime on 09/27/2017.  Subsequent ED visit 9/27, he was started on lisinopril and metoprolol for htn and tachycardia.  He is seeing a cardiologist.  Had echo done yesterday.  He has had multiple ED visits over the last couple of months.  He is having some light-headedness at times since starting meds.  Glucose running 120-140 at home.    He is here with his wife today.  He denies CP or SOB.  Cardiology has cleared him to walk 30 mins daily for 5 days per week.  Patient hasn't wanted to do this.  He is here with his wife today who is supportive.  He admits to a lot of anxiety right now bc he is afraid something will happen to him due to all of the recent diagnoses.  He is compliant with meds.  He is aggressively changing his diet and is eating very little carbs.  The new diagnoses make him feel anxious.  ED/Hospital notes reviewed.   Social History:  Married, no alcohol, non-smoker   ROS:   Constitutional:  No f/c, No night sweats, No unexplained weight loss. EENT:  No vision changes, No blurry vision, No hearing changes. No mouth, throat, or ear problems.  Respiratory: No cough, No SOB Cardiac: No CP, no palpitations GI:  No abd pain, No N/V/D. GU: No Urinary s/sx Musculoskeletal: No joint pain Neuro: No headache, occasional dizziness, no motor weakness.  Skin: No rash Endocrine:  No polydipsia. No polyuria.  Psych: Denies SI/HI  No problems updated.  ALLERGIES: No Known Allergies  PAST MEDICAL HISTORY: Past Medical History:  Diagnosis Date  . Diabetes  mellitus without complication ([place])   . Hypertension     MEDICATIONS AT HOME: Prior to Admission medications   Medication Sig Start Date End Date Taking? Authorizing Provider  albuterol (PROVENTIL HFA;VENTOLIN HFA) 108 (90 Base) MCG/ACT inhaler Inhale 1-2 puffs into the lungs every 6 (six) hours as needed for wheezing or shortness of breath. 10/23/17  Yes Malicia Blasdel M, PA-C  lisinopril (PRINIVIL,ZESTRIL) 10 MG tablet Take 1 tablet (10 mg total) by mouth daily. 10/23/17  Yes Freeman Caldron M, PA-C  metFORMIN (GLUCOPHAGE) 1000 MG tablet Take 1 tablet (1,000 mg total) by mouth every evening. 10/23/17  Yes Freeman Caldron M, PA-C  metoprolol tartrate (LOPRESSOR) 25 MG tablet Take 1 tablet (25 mg total) by mouth 2 (two) times daily. 10/23/17  Yes Freeman Caldron M, PA-C  pantoprazole (PROTONIX) 20 MG tablet Take 2 tablets (40 mg total) by mouth daily. 10/23/17  Yes Argentina Donovan, PA-C     Objective:  EXAM:   Vitals:   10/23/17 1110  BP: 126/86  Pulse: 85  Resp: 18  Temp: 98.1 F (36.7 C)  TempSrc: Oral  SpO2: 98%  Weight: 270 lb 9.6 oz (122.7 kg)  Height: 5\' 8"  (1.727 m)    General appearance : A&OX3. NAD. Non-toxic-appearing HEENT: Atraumatic and Normocephalic.  PERRLA. EOM intact.  TM clear B. Mouth-MMM, post pharynx WNL w/o  erythema, No PND. Neck: supple, no JVD. No cervical lymphadenopathy. No thyromegaly Chest/Lungs:  Breathing-non-labored, Good air entry bilaterally, breath sounds normal without rales, rhonchi, or wheezing  CVS: S1 S2 regular, no murmurs, gallops, rubs  Abdomen: Bowel sounds present, Non tender and not distended with no gaurding, rigidity or rebound. Extremities: Bilateral Lower Ext shows no edema, both legs are warm to touch with = pulse throughout Neurology:  CN II-XII grossly intact, Non focal.   Psych:  TP linear. J/I WNL. Normal speech. Appropriate eye contact and affect.  Skin:  No Rash  Data Review Lab Results  Component Value Date    HGBA1C 9.2 (A) 10/23/2017   HGBA1C 9.2 10/23/2017   HGBA1C 9.2 (A) 10/23/2017   HGBA1C 9.2 (A) 10/23/2017     Assessment & Plan   1. Anxiety Stress management techniques offered, deep breathing, redirecting thoughts, self-care, mild exercise build up as guided by cardiologist.  Defer but consider meds if not improving.  Try melatonin to help with sleep.    2. Type 2 diabetes mellitus with hyperglycemia, without long-term current use of insulin (HCC) Uncontrolled but much improvement and with lifestyle changes and metformin should continue to improve.  Continue to check FBS and record and bring to next visit.   - Glucose (CBG) - HgB A1c  3. Encounter for examination following treatment at hospital Much improved  4. Hypertension, unspecified type Controlled-continue current regimen.   Spent >59mins face to face with patient and wife about lifestyle changes, diet, stress management.     Patient have been counseled extensively about nutrition and exercise  Return in about 1 month (around 11/23/2017) for assign PCP; recheck BP, DM.  The patient was given clear instructions to go to ER or return to medical center if symptoms don't improve, worsen or new problems develop. The patient verbalized understanding. The patient was told to call to get lab results if they haven't heard anything in the next week.     Freeman Caldron, PA-C [facility] [place], [place]   10/23/2017, 11:52 AM

## 2017-10-23 ENCOUNTER — Ambulatory Visit: Payer: Self-pay | Attending: Family Medicine | Admitting: Physician Assistant

## 2017-10-23 ENCOUNTER — Other Ambulatory Visit: Payer: Self-pay

## 2017-10-23 VITALS — BP 126/86 | HR 85 | Temp 98.1°F | Resp 18 | Ht 68.0 in | Wt 270.6 lb

## 2017-10-23 DIAGNOSIS — E1165 Type 2 diabetes mellitus with hyperglycemia: Secondary | ICD-10-CM | POA: Insufficient documentation

## 2017-10-23 DIAGNOSIS — Z7984 Long term (current) use of oral hypoglycemic drugs: Secondary | ICD-10-CM | POA: Insufficient documentation

## 2017-10-23 DIAGNOSIS — I10 Essential (primary) hypertension: Secondary | ICD-10-CM | POA: Insufficient documentation

## 2017-10-23 DIAGNOSIS — Z09 Encounter for follow-up examination after completed treatment for conditions other than malignant neoplasm: Secondary | ICD-10-CM

## 2017-10-23 DIAGNOSIS — F419 Anxiety disorder, unspecified: Secondary | ICD-10-CM | POA: Insufficient documentation

## 2017-10-23 LAB — POCT GLYCOSYLATED HEMOGLOBIN (HGB A1C)
HBA1C, POC (PREDIABETIC RANGE): 9.2 % — AB (ref 5.7–6.4)
HEMOGLOBIN A1C: 9.2 % (ref 4.0–5.6)
HEMOGLOBIN A1C: 9.2 % — AB (ref 4.0–5.6)
HbA1c, POC (controlled diabetic range): 9.2 % — AB (ref 0.0–7.0)

## 2017-10-23 LAB — GLUCOSE, POCT (MANUAL RESULT ENTRY): POC Glucose: 151 mg/dl — AB (ref 70–99)

## 2017-10-23 MED ORDER — METOPROLOL TARTRATE 25 MG PO TABS: 25.0000 mg | ORAL_TABLET | Freq: Two times a day (BID) | ORAL | 1 refills | Status: DC

## 2017-10-23 MED ORDER — METFORMIN HCL 1000 MG PO TABS: 1000.0000 mg | ORAL_TABLET | Freq: Every evening | ORAL | 2 refills | Status: DC

## 2017-10-23 MED ORDER — LISINOPRIL 10 MG PO TABS: 10.0000 mg | ORAL_TABLET | Freq: Every day | ORAL | 0 refills | Status: DC

## 2017-10-23 MED ORDER — PANTOPRAZOLE SODIUM 20 MG PO TBEC: 40.0000 mg | DELAYED_RELEASE_TABLET | Freq: Every day | ORAL | 1 refills | Status: DC

## 2017-10-23 MED ORDER — ALBUTEROL SULFATE HFA 108 (90 BASE) MCG/ACT IN AERS: 1.0000 | INHALATION_SPRAY | Freq: Four times a day (QID) | RESPIRATORY_TRACT | 2 refills | Status: DC | PRN

## 2017-10-23 NOTE — Progress Notes (Signed)
Heartburn, 5/6 mild irritation   Faint/dizziness from medicationds

## 2017-10-23 NOTE — Patient Instructions (Signed)
Stress and Stress Management Stress is a normal reaction to life events. It is what you feel when life demands more than you are used to or more than you can handle. Some stress can be useful. For example, the stress reaction can help you catch the last bus of the day, study for a test, or meet a deadline at work. But stress that occurs too often or for too long can cause problems. It can affect your emotional health and interfere with relationships and normal daily activities. Too much stress can weaken your immune system and increase your risk for physical illness. If you already have a medical problem, stress can make it worse. What are the causes? All sorts of life events may cause stress. An event that causes stress for one person may not be stressful for another person. Major life events commonly cause stress. These may be positive or negative. Examples include losing your job, moving into a new home, getting married, having a baby, or losing a loved one. Less obvious life events may also cause stress, especially if they occur day after day or in combination. Examples include working long hours, driving in traffic, caring for children, being in debt, or being in a difficult relationship. What are the signs or symptoms? Stress may cause emotional symptoms including, the following:  Anxiety. This is feeling worried, afraid, on edge, overwhelmed, or out of control.  Anger. This is feeling irritated or impatient.  Depression. This is feeling sad, down, helpless, or guilty.  Difficulty focusing, remembering, or making decisions.  Stress may cause physical symptoms, including the following:  Aches and pains. These may affect your head, neck, back, stomach, or other areas of your body.  Tight muscles or clenched jaw.  Low energy or trouble sleeping.  Stress may cause unhealthy behaviors, including the following:  Eating to feel better (overeating) or skipping meals.  Sleeping too little,  too much, or both.  Working too much or putting off tasks (procrastination).  Smoking, drinking alcohol, or using drugs to feel better.  How is this diagnosed? Stress is diagnosed through an assessment by your health care provider. Your health care provider will ask questions about your symptoms and any stressful life events.Your health care provider will also ask about your medical history and may order blood tests or other tests. Certain medical conditions and medicine can cause physical symptoms similar to stress. Mental illness can cause emotional symptoms and unhealthy behaviors similar to stress. Your health care provider may refer you to a mental health professional for further evaluation. How is this treated? Stress management is the recommended treatment for stress.The goals of stress management are reducing stressful life events and coping with stress in healthy ways. Techniques for reducing stressful life events include the following:  Stress identification. Self-monitor for stress and identify what causes stress for you. These skills may help you to avoid some stressful events.  Time management. Set your priorities, keep a calendar of events, and learn to say "no." These tools can help you avoid making too many commitments.  Techniques for coping with stress include the following:  Rethinking the problem. Try to think realistically about stressful events rather than ignoring them or overreacting. Try to find the positives in a stressful situation rather than focusing on the negatives.  Exercise. Physical exercise can release both physical and emotional tension. The key is to find a form of exercise you enjoy and do it regularly.  Relaxation techniques. These relax the body and  mind. Examples include yoga, meditation, tai chi, biofeedback, deep breathing, progressive muscle relaxation, listening to music, being out in nature, journaling, and other hobbies. Again, the key is to find  one or more that you enjoy and can do regularly.  Healthy lifestyle. Eat a balanced diet, get plenty of sleep, and do not smoke. Avoid using alcohol or drugs to relax.  Strong support network. Spend time with family, friends, or other people you enjoy being around.Express your feelings and talk things over with someone you trust.  Counseling or talktherapy with a mental health professional may be helpful if you are having difficulty managing stress on your own. Medicine is typically not recommended for the treatment of stress.Talk to your health care provider if you think you need medicine for symptoms of stress. Follow these instructions at home:  Keep all follow-up visits as directed by your health care provider.  Take all medicines as directed by your health care provider. Contact a health care provider if:  Your symptoms get worse or you start having new symptoms.  You feel overwhelmed by your problems and can no longer manage them on your own. Get help right away if:  You feel like hurting yourself or someone else. This information is not intended to replace advice given to you by your health care provider. Make sure you discuss any questions you have with your health care provider. Document Released: 06/26/2000 Document Revised: 06/08/2015 Document Reviewed: 08/25/2012 Elsevier Interactive Patient Education  2017 [employer]  

## 2017-10-24 ENCOUNTER — Telehealth: Payer: Self-pay | Admitting: Cardiology

## 2017-10-24 NOTE — Telephone Encounter (Signed)
Spoke with pt, after he received a voicemail message from [place]. Pt aware of echo results.  Notes recorded by [name], MD on 10/24/2017 at 1:36 PM EDT I had to leave a message. Patient needs the results of his echo. His EF is reduced and I would like to see him in the office next week to discuss further treatment and work up. Call [name] with the results.  He is already scheduled to see Dr.Hochrein on 10/21 Pt is requesting a earlier appt if possible. Pt has several questions, answered pt questions to the best of my ability. He would like Dr.Hochrein to call him. He has questions,i.e.should he limit his activity, what can he do and not do. Adv pt to not over exert himself and to take it easy until he is seen. Adv pt that I will fwd the message to Dr.Hochrein and his [place].

## 2017-10-24 NOTE — Telephone Encounter (Signed)
Call and spoke with pt appt made for Monday @ 10:20 am.

## 2017-10-24 NOTE — Telephone Encounter (Signed)
Spoke with pt a second time. He would like a call from [place] today. Adv him that I have fwd the message. Spoke with Nya and she is aware.

## 2017-10-24 NOTE — Telephone Encounter (Signed)
° °  Please call patient with echo results

## 2017-10-26 NOTE — Progress Notes (Signed)
Cardiology Office Note   Date:  10/27/2017   ID:  Bryan English, DOB [birth year], MRN [id number]  PCP:  Patient, No Pcp Per  Cardiologist:   No primary care provider on file.    Chief Complaint  Patient presents with  . Cardiomyopathy      History of Present Illness: Bryan English is a 52 y.o. male who was referred by himself for evaluation of chest pain.   I saw him for the first time earlier this month.  He had had multiple ED visits for chest pain. He was not found to have any evidence for ischemia.  In a previous visit in August he had had negative CT for PE.  He did have coronary calcification in the LAD.   When I saw him I sent him for an echo.  He was found to have an EF of 20%.  Since I last saw him he was in the ED again with numbness and tingling in his hands.  He was noted to be hypertensive but was thought to have significant anxiety.  I reviewed these records for this visit.      He denies any new cardiovascular symptoms.  He has had no new shortness of breath, PND or orthopnea.  He said no palpitations, presyncope or syncope.  He did have a chest discomfort and went along with hand tingling that led to the ER but he says this was reflux.  He is been very anxious.  Past Medical History:  Diagnosis Date  . Diabetes mellitus without complication ([place])   . Hypertension     History reviewed. No pertinent surgical history.   Current Outpatient Medications  Medication Sig Dispense Refill  . albuterol (PROVENTIL HFA;VENTOLIN HFA) 108 (90 Base) MCG/ACT inhaler Inhale 1-2 puffs into the lungs every 6 (six) hours as needed for wheezing or shortness of breath. 1 Inhaler 2  . lisinopril (PRINIVIL,ZESTRIL) 10 MG tablet Take 1 tablet (10 mg total) by mouth daily. 90 tablet 0  . metFORMIN (GLUCOPHAGE) 1000 MG tablet Take 1 tablet (1,000 mg total) by mouth every evening. (Patient taking differently: Take 1,000 mg by mouth daily with supper. ) 30 tablet 2  . pantoprazole  (PROTONIX) 20 MG tablet Take 2 tablets (40 mg total) by mouth daily. (Patient taking differently: Take 20 mg by mouth daily. ) 60 tablet 1  . metoprolol succinate (TOPROL-XL) 100 MG 24 hr tablet Take 1 tablet (100 mg total) by mouth daily. Take with or immediately following a meal. 30 tablet 2   No current facility-administered medications for this visit.     Allergies:   Patient has no known allergies.    ROS:  Please see the history of present illness.   Otherwise, review of systems are positive for none.   All other systems are reviewed and negative.    PHYSICAL EXAM: VS:  BP 130/80 (BP Location: Left Arm, Patient Position: Sitting, Cuff Size: Large)   Pulse 88   Ht 5' 8.5" (1.74 m)   Wt 266 lb (120.7 kg)   BMI 39.86 kg/m  , BMI Body mass index is 39.86 kg/m. GENERAL:  Well appearing NECK:  No jugular venous distention, waveform within normal limits, carotid upstroke brisk and symmetric, no bruits, no thyromegaly LUNGS:  Clear to auscultation bilaterally CHEST:  Unremarkable HEART:  PMI not displaced or sustained,S1 and S2 within normal limits, no S3, no S4, no clicks, no rubs, no murmurs ABD:  Flat, positive bowel sounds normal in frequency  in pitch, no bruits, no rebound, no guarding, no midline pulsatile mass, no hepatomegaly, no splenomegaly EXT:  2 plus pulses throughout, no edema, no cyanosis no clubbing SKIN: Psoriasis    EKG:  EKG is not ordered today.    Recent Labs: 04/05/2017: Magnesium 1.7 10/14/2017: TSH 1.600 10/17/2017: ALT 26; Hemoglobin 15.0; Platelets 299 10/20/2017: BUN 15; Creatinine, Ser 0.99; Potassium 3.5; Sodium 138    Lipid Panel No results found for: CHOL, TRIG, HDL, CHOLHDL, VLDL, LDLCALC, LDLDIRECT    Wt Readings from Last 3 Encounters:  10/27/17 266 lb (120.7 kg)  10/27/17 265 lb (120.2 kg)  10/23/17 270 lb 9.6 oz (122.7 kg)      Other studies Reviewed: Additional studies/ records that were reviewed today include: Echo Review of the  above records demonstrates: See elsewhere  ASSESSMENT AND PLAN:   CARDIOMYOPATHY:   We had a long discussion about this.  The etiology is not clear.  He will need a left heart catheterization given the coronary calcification noted and a reduced ejection fraction.  I will begin to titrate his medications.  I will stop his metoprolol tartrate and start succinate at 100 mg daily.  At the next visit I would consider changing to [facility].  CHEST PAIN:    He will have a left heart catheterization. The patient understands that risks included but are not limited to stroke (1 in 1000), death (1 in 68), kidney failure [usually temporary] (1 in 500), bleeding (1 in 200), allergic reaction [possibly serious] (1 in 200).  The patient understands and agrees to proceed.   DM: This is a new diagnosis.  He is being managed with metformin.   TACHYCARDIA:    TSH was normal.  This is likely secondary to the cardiomyopathy.   Current medicines are reviewed at length with the patient today.  The patient does not have concerns regarding medicines.  The following changes have been made:  As above  Labs/ tests ordered today include:   No orders of the defined types were placed in this encounter.    Disposition:   FU with APP in two weeks for med titration. Ronnell Guadalajara, MD  10/27/2017 11:41 AM    [place]

## 2017-10-26 NOTE — H&P (View-Only) (Signed)
Cardiology Office Note   Date:  10/27/2017   ID:  Bryan English, DOB [birth year], MRN [id number]  PCP:  Patient, No Pcp Per  Cardiologist:   No primary care provider on file.    Chief Complaint  Patient presents with  . Cardiomyopathy      History of Present Illness: Bryan English is a 52 y.o. male who was referred by himself for evaluation of chest pain.   I saw him for the first time earlier this month.  He had had multiple ED visits for chest pain. He was not found to have any evidence for ischemia.  In a previous visit in August he had had negative CT for PE.  He did have coronary calcification in the LAD.   When I saw him I sent him for an echo.  He was found to have an EF of 20%.  Since I last saw him he was in the ED again with numbness and tingling in his hands.  He was noted to be hypertensive but was thought to have significant anxiety.  I reviewed these records for this visit.      He denies any new cardiovascular symptoms.  He has had no new shortness of breath, PND or orthopnea.  He said no palpitations, presyncope or syncope.  He did have a chest discomfort and went along with hand tingling that led to the ER but he says this was reflux.  He is been very anxious.  Past Medical History:  Diagnosis Date  . Diabetes mellitus without complication ([place])   . Hypertension     History reviewed. No pertinent surgical history.   Current Outpatient Medications  Medication Sig Dispense Refill  . albuterol (PROVENTIL HFA;VENTOLIN HFA) 108 (90 Base) MCG/ACT inhaler Inhale 1-2 puffs into the lungs every 6 (six) hours as needed for wheezing or shortness of breath. 1 Inhaler 2  . lisinopril (PRINIVIL,ZESTRIL) 10 MG tablet Take 1 tablet (10 mg total) by mouth daily. 90 tablet 0  . metFORMIN (GLUCOPHAGE) 1000 MG tablet Take 1 tablet (1,000 mg total) by mouth every evening. (Patient taking differently: Take 1,000 mg by mouth daily with supper. ) 30 tablet 2  . pantoprazole  (PROTONIX) 20 MG tablet Take 2 tablets (40 mg total) by mouth daily. (Patient taking differently: Take 20 mg by mouth daily. ) 60 tablet 1  . metoprolol succinate (TOPROL-XL) 100 MG 24 hr tablet Take 1 tablet (100 mg total) by mouth daily. Take with or immediately following a meal. 30 tablet 2   No current facility-administered medications for this visit.     Allergies:   Patient has no known allergies.    ROS:  Please see the history of present illness.   Otherwise, review of systems are positive for none.   All other systems are reviewed and negative.    PHYSICAL EXAM: VS:  BP 130/80 (BP Location: Left Arm, Patient Position: Sitting, Cuff Size: Large)   Pulse 88   Ht 5' 8.5" (1.74 m)   Wt 266 lb (120.7 kg)   BMI 39.86 kg/m  , BMI Body mass index is 39.86 kg/m. GENERAL:  Well appearing NECK:  No jugular venous distention, waveform within normal limits, carotid upstroke brisk and symmetric, no bruits, no thyromegaly LUNGS:  Clear to auscultation bilaterally CHEST:  Unremarkable HEART:  PMI not displaced or sustained,S1 and S2 within normal limits, no S3, no S4, no clicks, no rubs, no murmurs ABD:  Flat, positive bowel sounds normal in frequency  in pitch, no bruits, no rebound, no guarding, no midline pulsatile mass, no hepatomegaly, no splenomegaly EXT:  2 plus pulses throughout, no edema, no cyanosis no clubbing SKIN: Psoriasis    EKG:  EKG is not ordered today.    Recent Labs: 04/05/2017: Magnesium 1.7 10/14/2017: TSH 1.600 10/17/2017: ALT 26; Hemoglobin 15.0; Platelets 299 10/20/2017: BUN 15; Creatinine, Ser 0.99; Potassium 3.5; Sodium 138    Lipid Panel No results found for: CHOL, TRIG, HDL, CHOLHDL, VLDL, LDLCALC, LDLDIRECT    Wt Readings from Last 3 Encounters:  10/27/17 266 lb (120.7 kg)  10/27/17 265 lb (120.2 kg)  10/23/17 270 lb 9.6 oz (122.7 kg)      Other studies Reviewed: Additional studies/ records that were reviewed today include: Echo Review of the  above records demonstrates: See elsewhere  ASSESSMENT AND PLAN:   CARDIOMYOPATHY:   We had a long discussion about this.  The etiology is not clear.  He will need a left heart catheterization given the coronary calcification noted and a reduced ejection fraction.  I will begin to titrate his medications.  I will stop his metoprolol tartrate and start succinate at 100 mg daily.  At the next visit I would consider changing to [facility]  CHEST PAIN:    He will have a left heart catheterization. The patient understands that risks included but are not limited to stroke (1 in 1000), death (1 in 29), kidney failure [usually temporary] (1 in 500), bleeding (1 in 200), allergic reaction [possibly serious] (1 in 200).  The patient understands and agrees to proceed.   DM: This is a new diagnosis.  He is being managed with metformin.   TACHYCARDIA:    TSH was normal.  This is likely secondary to the cardiomyopathy.   Current medicines are reviewed at length with the patient today.  The patient does not have concerns regarding medicines.  The following changes have been made:  As above  Labs/ tests ordered today include:   No orders of the defined types were placed in this encounter.    Disposition:   FU with APP in two weeks for med titration. Ronnell Guadalajara, MD  10/27/2017 11:41 AM    [name]

## 2017-10-27 ENCOUNTER — Emergency Department (HOSPITAL_BASED_OUTPATIENT_CLINIC_OR_DEPARTMENT_OTHER): Payer: Self-pay

## 2017-10-27 ENCOUNTER — Other Ambulatory Visit: Payer: Self-pay

## 2017-10-27 ENCOUNTER — Emergency Department (HOSPITAL_BASED_OUTPATIENT_CLINIC_OR_DEPARTMENT_OTHER)
Admission: EM | Admit: 2017-10-27 | Discharge: 2017-10-27 | Disposition: A | Discharge status: Home / Self Care | Payer: Self-pay | Attending: Emergency Medicine | Admitting: Emergency Medicine

## 2017-10-27 ENCOUNTER — Ambulatory Visit (INDEPENDENT_AMBULATORY_CARE_PROVIDER_SITE_OTHER): Payer: Self-pay | Admitting: Cardiology

## 2017-10-27 ENCOUNTER — Encounter: Payer: Self-pay | Admitting: Cardiology

## 2017-10-27 ENCOUNTER — Telehealth: Payer: Self-pay | Admitting: Cardiology

## 2017-10-27 VITALS — BP 130/80 | HR 88 | Ht 68.5 in | Wt 266.0 lb

## 2017-10-27 DIAGNOSIS — R931 Abnormal findings on diagnostic imaging of heart and coronary circulation: Secondary | ICD-10-CM

## 2017-10-27 DIAGNOSIS — E119 Type 2 diabetes mellitus without complications: Secondary | ICD-10-CM | POA: Insufficient documentation

## 2017-10-27 DIAGNOSIS — I10 Essential (primary) hypertension: Secondary | ICD-10-CM | POA: Insufficient documentation

## 2017-10-27 DIAGNOSIS — R142 Eructation: Secondary | ICD-10-CM | POA: Insufficient documentation

## 2017-10-27 DIAGNOSIS — I428 Other cardiomyopathies: Secondary | ICD-10-CM | POA: Insufficient documentation

## 2017-10-27 DIAGNOSIS — R0609 Other forms of dyspnea: Secondary | ICD-10-CM | POA: Insufficient documentation

## 2017-10-27 DIAGNOSIS — I429 Cardiomyopathy, unspecified: Secondary | ICD-10-CM

## 2017-10-27 DIAGNOSIS — R000 Tachycardia, unspecified: Secondary | ICD-10-CM

## 2017-10-27 LAB — TROPONIN I: Troponin I: <0.03 ng/mL (ref <0.03)

## 2017-10-27 MED ORDER — METOPROLOL SUCCINATE ER 100 MG PO TB24: 100.0000 mg | ORAL_TABLET | Freq: Every day | ORAL | 2 refills | Status: DC

## 2017-10-27 MED ORDER — GI COCKTAIL ~~LOC~~
30.0000 mL | Freq: Once | ORAL | Status: AC
  Administered 2017-10-27: 30 mL via ORAL
  Filled 2017-10-27: qty 30

## 2017-10-27 MED ORDER — SUCRALFATE 1 G PO TABS
1.0000 g | ORAL_TABLET | Freq: Once | ORAL | Status: AC
  Administered 2017-10-27: 1 g via ORAL
  Filled 2017-10-27: qty 1

## 2017-10-27 NOTE — Discharge Instructions (Addendum)
You were seen today for belching and shortness of breath.  Your work-up is reassuring including heart testing and chest x-ray.  Follow-up closely with your cardiologist as planned.  Sometimes a reduced ejection fraction can cause symptoms of shortness of breath particularly at night.  Continue to take your Protonix as scheduled daily.

## 2017-10-27 NOTE — ED Provider Notes (Signed)
[facility] Provider Note   CSN: [id number] Arrival date & time: 10/27/17  0435     History   Chief Complaint Chief Complaint  Patient presents with  . Shortness of Breath    HPI Bryan English is a 52 y.o. male.  HPI  This is a 52 year old male with a history of diabetes, hypertension who presents with "gas.  He describes belching, diaphoresis, and shortness of breath.  His symptoms have since resolved and he reports that he is back to normal.  He did not have any chest pain.  Patient reports that he continues to take his Protonix.  He also reports that he has seen cardiology and is due to follow-up later today at 10 AM for an abnormal echo.  He denies any recent weight gain and endorses weight loss and exercise.  Denies any leg swelling.  Denies any recent fevers or cough.  I reviewed the patient's chart extensively.  He has had 7 visits since the end of August mostly with nighttime symptoms chest pain or paresthesias.  During that time he has had multiple repeat EKGs that have been reassuring as well as troponin and a CT that ruled out PE.  He did have an echocardiogram recently by cardiology that showed an EF of 20 to 25%.  He has not followed up.  Unclear whether this is related to ischemic or nonischemic disease.  Past Medical History:  Diagnosis Date  . Diabetes mellitus without complication ([place])   . Hypertension     Patient Active Problem List   Diagnosis Date Noted  . 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 Hypertension 10/14/2017  . Medication management 10/14/2017  . Tachycardia 10/14/2017    No past surgical history on file.      Home Medications    Prior to Admission medications   Medication Sig Start Date End Date Taking? Authorizing Provider  albuterol (PROVENTIL HFA;VENTOLIN HFA) 108 (90 Base) MCG/ACT inhaler Inhale 1-2 puffs into the lungs every 6 (six) hours as needed for wheezing or shortness of breath.  10/23/17   Argentina Donovan, PA-C  lisinopril (PRINIVIL,ZESTRIL) 10 MG tablet Take 1 tablet (10 mg total) by mouth daily. 10/23/17   Argentina Donovan, PA-C  metFORMIN (GLUCOPHAGE) 1000 MG tablet Take 1 tablet (1,000 mg total) by mouth every evening. 10/23/17   Argentina Donovan, PA-C  metoprolol tartrate (LOPRESSOR) 25 MG tablet Take 1 tablet (25 mg total) by mouth 2 (two) times daily. 10/23/17   Argentina Donovan, PA-C  pantoprazole (PROTONIX) 20 MG tablet Take 2 tablets (40 mg total) by mouth daily. 10/23/17   Argentina Donovan, PA-C    Family History Family History  Family history unknown: Yes    Social History Social History   Tobacco Use  . Smoking status: Never Smoker  . Smokeless tobacco: Never Used  Substance Use Topics  . Alcohol use: Not Currently  . Drug use: Never     Allergies   Patient has no known allergies.   Review of Systems Review of Systems  Constitutional: Positive for diaphoresis. Negative for fever.  Respiratory: Positive for shortness of breath.   Cardiovascular: Negative for chest pain.  Gastrointestinal: Negative for abdominal pain, nausea and vomiting.       Belching  Genitourinary: Negative for dysuria.  All other systems reviewed and are negative.    Physical Exam Updated Vital Signs BP (!) 134/92 (BP Location: Right Arm)   Pulse 82   Temp  97.7 F (36.5 C) (Oral)   Resp 18   Ht 1.727 m (5\' 8" )   Wt 120.2 kg   SpO2 95%   BMI 40.29 kg/m   Physical Exam  Constitutional: He is oriented to person, place, and time. He appears well-developed and well-nourished.  Overweight, no acute distress  HENT:  Head: Normocephalic and atraumatic.  Eyes: Pupils are equal, round, and reactive to light.  Neck: Neck supple.  Cardiovascular: Normal rate, regular rhythm and normal heart sounds.  No murmur heard. Pulmonary/Chest: Effort normal and breath sounds normal. No tachypnea. No respiratory distress. He has no wheezes.  Abdominal: Soft. Bowel  sounds are normal. There is no tenderness. There is no rebound.  Musculoskeletal: He exhibits no edema.       Right lower leg: He exhibits no edema.       Left lower leg: He exhibits no edema.  Lymphadenopathy:    He has no cervical adenopathy.  Neurological: He is alert and oriented to person, place, and time.  Skin: Skin is warm and dry.  Psychiatric: He has a normal mood and affect.  Nursing note and vitals reviewed.    ED Treatments / Results  Labs (all labs ordered are listed, but only abnormal results are displayed) Labs Reviewed  TROPONIN I    EKG EKG Interpretation  Date/Time:  Monday October 27 2017 04:56:20 EDT Ventricular Rate:  83 PR Interval:    QRS Duration: 106 QT Interval:  434 QTC Calculation: 510 R Axis:   -18 Text Interpretation:  Sinus rhythm Premature ventricular complexes Borderline left axis deviation Low voltage, extremity leads Prolonged QT interval Prolonged QT Otherwise no significant change Confirmed by Thayer Jew [phone]) on 10/27/2017 5:00:03 AM   Radiology Dg Chest 2 View  Result Date: 10/27/2017 CLINICAL DATA:  Shortness of breath EXAM: CHEST - 2 VIEW COMPARISON:  10/17/2017 FINDINGS: The heart size and mediastinal contours are within normal limits. Both lungs are clear. The visualized skeletal structures are unremarkable. IMPRESSION: No active cardiopulmonary disease. Electronically Signed   By: Ulyses Jarred M.D.   On: 10/27/2017 05:31    Procedures Procedures (including critical care time)  Medications Ordered in ED Medications  sucralfate (CARAFATE) tablet 1 g (1 g Oral Given 10/27/17 0516)  gi cocktail (Maalox,Lidocaine,Donnatal) (30 mLs Oral Given 10/27/17 0517)     Initial Impression / Assessment and Plan / ED Course  I have reviewed the triage vital signs and the nursing notes.  Pertinent labs & imaging results that were available during my care of the patient were reviewed by me and considered in my medical decision  making (see chart for details).     Patient presents with shortness of breath, diaphoresis, belching prior to arrival.  He is overall nontoxic-appearing and vital signs are largely reassuring.  He is currently symptom-free.  He has had multiple visits to the ED over the last 6 weeks for similar symptoms.  He has followed up with cardiology and is due to follow-up regarding reduced EF on echocardiogram.  Patient does not appear volume overloaded.  At this time he does not appear in heart failure as he is nontoxic and his breath sounds are clear.  He is satting 98% on room air.  EKG shows no signs of ischemia.  Chest x-ray shows no evidence of volume overload.  Troponin is negative.  Reflux is a consideration.  PND is also a consideration.  Would recommend close follow-up as already scheduled.  Patient encouraged to continue to take  his Protonix.  After history, exam, and medical workup I feel the patient has been appropriately medically screened and is safe for discharge home. Pertinent diagnoses were discussed with the patient. Patient was given return precautions.   Final Clinical Impressions(s) / ED Diagnoses   Final diagnoses:  Other form of dyspnea  Belching    ED Discharge Orders    None       Merryl Hacker, MD 10/27/17 [phone]

## 2017-10-27 NOTE — Patient Instructions (Addendum)
Medication Instructions:  STOP- Metoprolol Tartrate START- Metoprolol Succinate 100 mg daily  If you need a refill on your cardiac medications before your next appointment, please call your pharmacy.  Labwork: None Ordered   If you have labs (blood work) drawn today and your tests are completely normal, you will receive your results only by: Marland Kitchen MyChart Message (if you have MyChart) OR . A paper copy in the mail If you have any lab test that is abnormal or we need to change your treatment, we will call you to review the results.  Testing/Procedures: Your physician has requested that you have a cardiac catheterization. Cardiac catheterization is used to diagnose and/or treat various heart conditions. Doctors may recommend this procedure for a number of different reasons. The most common reason is to evaluate chest pain. Chest pain can be a symptom of coronary artery disease (CAD), and cardiac catheterization can show whether plaque is narrowing or blocking your heart's arteries. This procedure is also used to evaluate the valves, as well as measure the blood flow and oxygen levels in different parts of your heart. For further information please visit [email or web address]tn. Please follow instruction sheet, as given.  Special Instructions:    [facility] [address] [place] [address] Dept: [phone] Loc: [phone]  Bryan English  10/27/2017  You are scheduled for a Cardiac Catheterization on Wednesday, October 16 with Dr. Shelva Majestic.  1. Please arrive at the [facility] (Main Entrance A) at [facility]: [address] [place], [place] [address] at 8:00 AM (This time is two hours before your procedure to ensure your preparation). Free valet parking service is available.   Special note: Every effort is made to have your procedure done on time. Please understand that emergencies  sometimes delay scheduled procedures.  2. Diet: Do not eat solid foods after midnight.  The patient may have clear liquids until 5am upon the day of the procedure.   4. Medication instructions in preparation for your procedure:   Contrast Allergy: No   Stop taking, Lisinopril (Zestril or Prinivil) on Wednesday, October 16.    Stop Taking PO Diabetes Meds Glucophage (Metformin)on Wednesday, October 16.  On the morning of your procedure, take your  morning medicines NOT listed above.  You may use sips of water.  5. Plan for one night stay--bring personal belongings. 6. Bring a current list of your medications and current insurance cards. 7. You MUST have a responsible person to drive you home. 8. Someone MUST be with you the first 24 hours after you arrive home or your discharge will be delayed. 9. Please wear clothes that are easy to get on and off and wear slip-on shoes.  Thank you for allowing [place] to care for you!   -- [facility]   Follow-Up: You will need a follow up appointment in 2 weeks with APP.   You may see  DR Percival Spanish or one of the following Advanced Practice Providers on your designated Care Team:   . [name], PA-C . Jory Sims, DNP, ANP  At [facility], you and your health needs are our priority.  As part of our continuing mission to provide you with exceptional heart care, we have created designated Provider Care Teams.  These Care Teams include your primary Cardiologist (physician) and Advanced Practice Providers (APPs -  Physician Assistants and Nurse Practitioners) who all work together to provide you with the  care you need, when you need it.    Thank you for choosing [facility] at [facility]!!

## 2017-10-27 NOTE — ED Triage Notes (Signed)
States he woke up belching, sweating and short of breath. Pt and visitor state "not anxiety". RRT at bedside to assess during triage.

## 2017-10-27 NOTE — Telephone Encounter (Addendum)
Spoke with pt. Adv pt that he can switch from Metoprolol Tartrate to Succinate with out any interruption. He should stop Metoprolol tartrate today and start Metoprolol succinate tomorrow. Pt verbalized understanding.

## 2017-10-27 NOTE — Telephone Encounter (Signed)
New Message         Pt c/o medication issue:  1. Name of Medication: metoprolol succinate (TOPROL-XL) 100 MG 24 hr tablet  2. How are you currently taking this medication (dosage and times per day)? 1 x a day  3. Are you having a reaction (difficulty breathing--STAT)? No   4. What is your medication issue? Patient is changing from medication to another. Patient has questions about that.

## 2017-10-28 ENCOUNTER — Telehealth: Payer: Self-pay

## 2017-10-28 NOTE — Telephone Encounter (Signed)
Patient contacted pre-catheterization at [facility] scheduled for:  10/29/2017 at 1000 Verified arrival time and place:  [facility] @ 0800 Confirmed AM meds to be taken pre-cath with sip of water: Hold metformin and lisinopril tonight (per nurse note) Advised to take ASA 81 mg Confirmed patient has responsible person to drive home post procedure and observe patient for 24 hours:  yes Addl concerns:  none

## 2017-10-29 ENCOUNTER — Ambulatory Visit (HOSPITAL_COMMUNITY)
Admission: RE | Admit: 2017-10-29 | Discharge: 2017-10-29 | Disposition: A | Discharge status: Home / Self Care | Payer: Self-pay | Source: Ambulatory Visit | Attending: Cardiovascular Disease | Admitting: Cardiovascular Disease

## 2017-10-29 ENCOUNTER — Other Ambulatory Visit: Payer: Self-pay

## 2017-10-29 ENCOUNTER — Telehealth: Payer: Self-pay | Admitting: Cardiology

## 2017-10-29 ENCOUNTER — Encounter (HOSPITAL_COMMUNITY)
Admission: RE | Disposition: A | Discharge status: Home / Self Care | Payer: Self-pay | Source: Ambulatory Visit | Attending: Cardiovascular Disease

## 2017-10-29 DIAGNOSIS — I2510 Atherosclerotic heart disease of native coronary artery without angina pectoris: Secondary | ICD-10-CM

## 2017-10-29 DIAGNOSIS — Z7984 Long term (current) use of oral hypoglycemic drugs: Secondary | ICD-10-CM | POA: Insufficient documentation

## 2017-10-29 DIAGNOSIS — I428 Other cardiomyopathies: Secondary | ICD-10-CM

## 2017-10-29 DIAGNOSIS — E119 Type 2 diabetes mellitus without complications: Secondary | ICD-10-CM | POA: Insufficient documentation

## 2017-10-29 DIAGNOSIS — K219 Gastro-esophageal reflux disease without esophagitis: Secondary | ICD-10-CM | POA: Insufficient documentation

## 2017-10-29 DIAGNOSIS — I25119 Atherosclerotic heart disease of native coronary artery with unspecified angina pectoris: Secondary | ICD-10-CM | POA: Insufficient documentation

## 2017-10-29 DIAGNOSIS — R000 Tachycardia, unspecified: Secondary | ICD-10-CM | POA: Insufficient documentation

## 2017-10-29 DIAGNOSIS — I420 Dilated cardiomyopathy: Secondary | ICD-10-CM | POA: Insufficient documentation

## 2017-10-29 DIAGNOSIS — I10 Essential (primary) hypertension: Secondary | ICD-10-CM | POA: Insufficient documentation

## 2017-10-29 DIAGNOSIS — Z79899 Other long term (current) drug therapy: Secondary | ICD-10-CM | POA: Insufficient documentation

## 2017-10-29 HISTORY — DX: Other cardiomyopathies: I42.8

## 2017-10-29 HISTORY — PX: LEFT HEART CATH AND CORONARY ANGIOGRAPHY: CATH118249

## 2017-10-29 LAB — GLUCOSE, CAPILLARY: GLUCOSE-CAPILLARY: 149 mg/dL — AB (ref 70–99)

## 2017-10-29 SURGERY — LEFT HEART CATH AND CORONARY ANGIOGRAPHY
Anesthesia: LOCAL

## 2017-10-29 MED ORDER — SODIUM CHLORIDE 0.9 % IV SOLN: INTRAVENOUS | Status: DC

## 2017-10-29 MED ORDER — ASPIRIN 81 MG PO CHEW: 81.0000 mg | CHEWABLE_TABLET | Freq: Every day | ORAL | Status: DC

## 2017-10-29 MED ORDER — LIDOCAINE HCL (PF) 1 % IJ SOLN
INTRAMUSCULAR | Status: AC
  Filled 2017-10-29: qty 30

## 2017-10-29 MED ORDER — MIDAZOLAM HCL 2 MG/2ML IJ SOLN
INTRAMUSCULAR | Status: DC | PRN
  Administered 2017-10-29: 2 mg via INTRAVENOUS

## 2017-10-29 MED ORDER — HEPARIN (PORCINE) IN NACL 1000-0.9 UT/500ML-% IV SOLN
INTRAVENOUS | Status: AC
  Filled 2017-10-29: qty 1000

## 2017-10-29 MED ORDER — SODIUM CHLORIDE 0.9% FLUSH: 3.0000 mL | INTRAVENOUS | Status: DC | PRN

## 2017-10-29 MED ORDER — SODIUM CHLORIDE 0.9 % IV SOLN: 250.0000 mL | INTRAVENOUS | Status: DC | PRN

## 2017-10-29 MED ORDER — IOHEXOL 350 MG/ML SOLN
INTRAVENOUS | Status: DC | PRN
  Administered 2017-10-29: 70 mL

## 2017-10-29 MED ORDER — VERAPAMIL HCL 2.5 MG/ML IV SOLN
INTRAVENOUS | Status: AC
  Filled 2017-10-29: qty 2

## 2017-10-29 MED ORDER — ASPIRIN 81 MG PO CHEW: 81.0000 mg | CHEWABLE_TABLET | Freq: Once | ORAL | Status: DC

## 2017-10-29 MED ORDER — FENTANYL CITRATE (PF) 100 MCG/2ML IJ SOLN
INTRAMUSCULAR | Status: DC | PRN
  Administered 2017-10-29: 25 ug via INTRAVENOUS

## 2017-10-29 MED ORDER — FENTANYL CITRATE (PF) 100 MCG/2ML IJ SOLN
INTRAMUSCULAR | Status: AC
  Filled 2017-10-29: qty 2

## 2017-10-29 MED ORDER — MIDAZOLAM HCL 2 MG/2ML IJ SOLN
INTRAMUSCULAR | Status: AC
  Filled 2017-10-29: qty 2

## 2017-10-29 MED ORDER — HEPARIN SODIUM (PORCINE) 1000 UNIT/ML IJ SOLN
INTRAMUSCULAR | Status: DC | PRN
  Administered 2017-10-29: 6000 [IU] via INTRAVENOUS

## 2017-10-29 MED ORDER — LIDOCAINE HCL (PF) 1 % IJ SOLN
INTRAMUSCULAR | Status: DC | PRN
  Administered 2017-10-29: 2 mL

## 2017-10-29 MED ORDER — SODIUM CHLORIDE 0.9 % WEIGHT BASED INFUSION: 1.0000 mL/kg/h | INTRAVENOUS | Status: DC

## 2017-10-29 MED ORDER — SODIUM CHLORIDE 0.9% FLUSH: 3.0000 mL | Freq: Two times a day (BID) | INTRAVENOUS | Status: DC

## 2017-10-29 MED ORDER — ONDANSETRON HCL 4 MG/2ML IJ SOLN: 4.0000 mg | Freq: Four times a day (QID) | INTRAMUSCULAR | Status: DC | PRN

## 2017-10-29 MED ORDER — HEPARIN (PORCINE) IN NACL 1000-0.9 UT/500ML-% IV SOLN
INTRAVENOUS | Status: DC | PRN
  Administered 2017-10-29 (×2): 500 mL

## 2017-10-29 MED ORDER — SODIUM CHLORIDE 0.9 % WEIGHT BASED INFUSION
3.0000 mL/kg/h | INTRAVENOUS | Status: AC
  Administered 2017-10-29: 3 mL/kg/h via INTRAVENOUS

## 2017-10-29 MED ORDER — VERAPAMIL HCL 2.5 MG/ML IV SOLN
INTRAVENOUS | Status: DC | PRN
  Administered 2017-10-29: 10 mL via INTRA_ARTERIAL

## 2017-10-29 MED ORDER — ACETAMINOPHEN 325 MG PO TABS: 650.0000 mg | ORAL_TABLET | ORAL | Status: DC | PRN

## 2017-10-29 SURGICAL SUPPLY — 11 items
CATH INFINITI 5 FR JL3.5 (CATHETERS) ×2 IMPLANT
CATH OPTITORQUE TIG 4.0 5F (CATHETERS) ×2 IMPLANT
DEVICE RAD COMP TR BAND LRG (VASCULAR PRODUCTS) ×2 IMPLANT
GLIDESHEATH SLEND SS 6F .021 (SHEATH) ×2 IMPLANT
GUIDEWIRE INQWIRE 1.5J.035X260 (WIRE) ×1 IMPLANT
INQWIRE 1.5J .035X260CM (WIRE) ×2
KIT HEART LEFT (KITS) ×2 IMPLANT
PACK CARDIAC CATHETERIZATION (CUSTOM PROCEDURE TRAY) ×2 IMPLANT
SYR MEDRAD MARK V 150ML (SYRINGE) ×2 IMPLANT
TRANSDUCER W/STOPCOCK (MISCELLANEOUS) ×2 IMPLANT
TUBING CIL FLEX 10 FLL-RA (TUBING) ×2 IMPLANT

## 2017-10-29 NOTE — Telephone Encounter (Signed)
Spoke with pt who states he had a cath today and was concerned by his BP is 97/20. Pt denies any symptoms. Pt informed to drink plenty of fluid and continue to monitor BP. Informed that if systolic drops below 90 to contact after hours MD. Pt verbalized understanding,  Pt also states since he was started on metoprolol he has been more SOB and concerned its related to the medication as he has read the side effects. Informed that I will route message to Dr. Percival Spanish for recommendations.

## 2017-10-29 NOTE — Telephone Encounter (Signed)
New Message   Patients states that he had a procedure done today and his BP was 122/84. But now is 97/60.   Pt c/o BP issue:  1. What are your last 5 BP readings? 97/60 2. Are you having any other symptoms (ex. Dizziness, headache, blurred vision, passed out)? none 3. What is your medication issue? none

## 2017-10-29 NOTE — Progress Notes (Signed)
Ambulated to bathroom to void.

## 2017-10-29 NOTE — Interval H&P Note (Signed)
Cath Lab Visit (complete for each Cath Lab visit)  Clinical Evaluation Leading to the Procedure:   ACS: No.  Non-ACS:    Anginal Classification: CCS III  Anti-ischemic medical therapy: Minimal Therapy (1 class of medications)  Non-Invasive Test Results: No non-invasive testing performed  Prior CABG: No previous CABG      History and Physical Interval Note:  10/29/2017 9:55 AM  L[place].  has presented today for surgery, with the diagnosis of cm  The various methods of treatment have been discussed with the patient and family. After consideration of risks, benefits and other options for treatment, the patient has consented to  Procedure(s): LEFT HEART CATH AND CORONARY ANGIOGRAPHY (N/A) as a surgical intervention .  The patient's history has been reviewed, patient examined, no change in status, stable for surgery.  I have reviewed the patient's chart and labs.  Questions were answered to the patient's satisfaction.     Shelva Majestic

## 2017-10-29 NOTE — Discharge Instructions (Signed)
Drink plenty of fluids.  °Keep right arm at or above heart level °Radial Site Care °Refer to this sheet in the next few weeks. These instructions provide you with information about caring for yourself after your procedure. Your health care provider may also give you more specific instructions. Your treatment has been planned according to current medical practices, but problems sometimes occur. Call your health care provider if you have any problems or questions after your procedure. °What can I expect after the procedure? °After your procedure, it is typical to have the following: °· Bruising at the radial site that usually fades within 1-2 weeks. °· Blood collecting in the tissue (hematoma) that may be painful to the touch. It should usually decrease in size and tenderness within 1-2 weeks. ° °Follow these instructions at home: °· Take medicines only as directed by your health care provider. °· You may shower 24-48 hours after the procedure or as directed by your health care provider. Remove the bandage (dressing) and gently wash the site with plain soap and water. Pat the area dry with a clean towel. Do not rub the site, because this may cause bleeding. °· Do not take baths, swim, or use a hot tub until your health care provider approves. °· Check your insertion site every day for redness, swelling, or drainage. °· Do not apply powder or lotion to the site. °· Do not flex or bend the affected arm for 24 hours or as directed by your health care provider. °· Do not push or pull heavy objects with the affected arm for 24 hours or as directed by your health care provider. °· Do not lift over 10 lb (4.5 kg) for 5 days after your procedure or as directed by your health care provider. °· Ask your health care provider when it is okay to: °? Return to work or school. °? Resume usual physical activities or sports. °? Resume sexual activity. °· Do not drive home if you are discharged the same day as the procedure. Have  someone else drive you. °· You may drive 24 hours after the procedure unless otherwise instructed by your health care provider. °· Do not operate machinery or power tools for 24 hours after the procedure. °· If your procedure was done as an outpatient procedure, which means that you went home the same day as your procedure, a responsible adult should be with you for the first 24 hours after you arrive home. °· Keep all follow-up visits as directed by your health care provider. This is important. °Contact a health care provider if: °· You have a fever. °· You have chills. °· You have increased bleeding from the radial site. Hold pressure on the site. °Get help right away if: °· You have unusual pain at the radial site. °· You have redness, warmth, or swelling at the radial site. °· You have drainage (other than a small amount of blood on the dressing) from the radial site. °· The radial site is bleeding, and the bleeding does not stop after 30 minutes of holding steady pressure on the site. °· Your arm or hand becomes pale, cool, tingly, or numb. °This information is not intended to replace advice given to you by your health care provider. Make sure you discuss any questions you have with your health care provider. °Document Released: 02/02/2010 Document Revised: 06/08/2015 Document Reviewed: 07/19/2013 °Elsevier Interactive Patient Education © 2018 [employer] ° °

## 2017-10-30 ENCOUNTER — Encounter (HOSPITAL_COMMUNITY): Payer: Self-pay | Admitting: Cardiovascular Disease

## 2017-10-30 NOTE — Telephone Encounter (Signed)
He can reduce the Toprol XL to 50 mg daily.

## 2017-10-31 NOTE — Telephone Encounter (Signed)
Spoke with pt of Dr Percival Spanish recommendation to cut Toprol XL to 50 mg daily, pt voice understanding and thanks.

## 2017-11-01 ENCOUNTER — Other Ambulatory Visit: Payer: Self-pay | Admitting: Urgent Care

## 2017-11-03 ENCOUNTER — Telehealth: Payer: Self-pay | Admitting: Cardiology

## 2017-11-03 ENCOUNTER — Ambulatory Visit: Payer: Self-pay | Admitting: Cardiology

## 2017-11-03 NOTE — Telephone Encounter (Signed)
New message    Pt c/o BP issue: STAT if pt c/o blurred vision, one-sided weakness or slurred speech  1. What are your last 5 BP readings? 95/65 110/60 105/60 98/68 110/70  2. Are you having any other symptoms (ex. Dizziness, headache, blurred vision, passed out)? Dizziness, lightheadedness  3. What is your BP issue? BP is running low.

## 2017-11-03 NOTE — Telephone Encounter (Signed)
I would like to try to continue current meds as he has a very weak heart and we are trying to titrate the meds.  He can have follow up next week with one of the APPs.

## 2017-11-03 NOTE — Telephone Encounter (Signed)
Returned call to patient of Dr. Percival Spanish who has continued low BP issues (see phone note from last week). He reports BP readings of 95/65, 110/60, 105/60, 98/68, 110/70 taken over the course of 4-5 days. His HR is running 70-85bpm but can get up to 90s. He states he is taking lisinopril 10mg  and metoprolol succinate 50mg  (as advised per MD last week). He c/o dizziness & lightheadedness.   Will route to primary cardiologist for advice

## 2017-11-04 NOTE — Telephone Encounter (Signed)
Spoke with pt to continue medications therapy because of weak heart, pt voice understanding, pt have appt with [name] on 10/30 advised to pt keep appt.

## 2017-11-09 ENCOUNTER — Encounter (HOSPITAL_BASED_OUTPATIENT_CLINIC_OR_DEPARTMENT_OTHER): Payer: Self-pay | Admitting: *Deleted

## 2017-11-09 ENCOUNTER — Emergency Department (HOSPITAL_BASED_OUTPATIENT_CLINIC_OR_DEPARTMENT_OTHER)
Admission: EM | Admit: 2017-11-09 | Discharge: 2017-11-09 | Disposition: A | Discharge status: Home / Self Care | Payer: Self-pay | Attending: Emergency Medicine | Admitting: Emergency Medicine

## 2017-11-09 ENCOUNTER — Other Ambulatory Visit: Payer: Self-pay

## 2017-11-09 DIAGNOSIS — E119 Type 2 diabetes mellitus without complications: Secondary | ICD-10-CM | POA: Insufficient documentation

## 2017-11-09 DIAGNOSIS — Z7984 Long term (current) use of oral hypoglycemic drugs: Secondary | ICD-10-CM | POA: Insufficient documentation

## 2017-11-09 DIAGNOSIS — I10 Essential (primary) hypertension: Secondary | ICD-10-CM | POA: Insufficient documentation

## 2017-11-09 DIAGNOSIS — R000 Tachycardia, unspecified: Secondary | ICD-10-CM

## 2017-11-09 LAB — CBC WITH DIFFERENTIAL/PLATELET
Abs Immature Granulocytes: 0.02 10*3/uL (ref 0.00–0.07)
BASOS PCT: 0 %
Basophils Absolute: 0 10*3/uL (ref 0.0–0.1)
EOS ABS: 0.2 10*3/uL (ref 0.0–0.5)
Eosinophils Relative: 2 %
HCT: 44.3 % (ref 39.0–52.0)
Hemoglobin: 14 g/dL (ref 13.0–17.0)
Immature Granulocytes: 0 %
Lymphocytes Relative: 28 %
Lymphs Abs: 2.6 10*3/uL (ref 0.7–4.0)
MCH: 24.5 pg — AB (ref 26.0–34.0)
MCHC: 31.6 g/dL (ref 30.0–36.0)
MCV: 77.6 fL — AB (ref 80.0–100.0)
MONOS PCT: 7 %
Monocytes Absolute: 0.7 10*3/uL (ref 0.1–1.0)
NEUTROS PCT: 63 %
Neutro Abs: 5.6 10*3/uL (ref 1.7–7.7)
PLATELETS: 245 10*3/uL (ref 150–400)
RBC: 5.71 MIL/uL (ref 4.22–5.81)
RDW: 13 % (ref 11.5–15.5)
WBC: 9.1 10*3/uL (ref 4.0–10.5)
nRBC: 0 % (ref 0.0–0.2)

## 2017-11-09 LAB — BASIC METABOLIC PANEL
ANION GAP: 14 (ref 5–15)
BUN: 15 mg/dL (ref 6–20)
CALCIUM: 9.2 mg/dL (ref 8.9–10.3)
CO2: 20 mmol/L — ABNORMAL LOW (ref 22–32)
Chloride: 101 mmol/L (ref 98–111)
Creatinine, Ser: 1.06 mg/dL (ref 0.61–1.24)
GFR calc Af Amer: >60 mL/min (ref >60)
Glucose, Bld: 163 mg/dL — ABNORMAL HIGH (ref 70–99)
Potassium: 3.6 mmol/L (ref 3.5–5.1)
SODIUM: 135 mmol/L (ref 135–145)

## 2017-11-09 LAB — MAGNESIUM: MAGNESIUM: 2 mg/dL (ref 1.7–2.4)

## 2017-11-09 NOTE — ED Notes (Signed)
MD with pt  

## 2017-11-09 NOTE — ED Triage Notes (Addendum)
C/o elevated heart rate that started 30 min ago. Pt denies any cp/sob. HR ST 107. Hx of recent heart cath and admission. Pt states he is taking amoxicillin for a tooth infections. Denies fevers. States his cath was "clean" but has a low EF.

## 2017-11-09 NOTE — ED Provider Notes (Signed)
[facility] EMERGENCY DEPARTMENT Provider Note  CSN: [id number] Arrival date & time: 11/09/17 4650  Chief Complaint(s) elevated heartrate  HPI Bryan English. is a 52 y.o. male with a history of hypertension, diabetes, nonischemic cardiomyopathy who recently had a clean heart cath less than 2 weeks ago presents to the emergency department with rapid heart rate and palpitations that began 30 minutes prior to arrival.  Patient reports that he awoke from sleep and noted the elevated heart rate.  Reports that the rate got up to the 140s.  No alleviating or aggravating factors.  Reports feeling anxious.  He denied any associated chest pain or shortness of breath.  No dizziness.  No associated nausea.  Denies any recent fevers but reports being on Augmentin for pulpitis.  He did endorse diarrhea since being on Augmentin but reports he is hydrating well.  Reports that he is compliant with his home medication including lisinopril and metoprolol.  He did report that he stopped taking metformin 1 week ago due to side effect intolerance.    HPI  Past Medical History Past Medical History:  Diagnosis Date  . Diabetes mellitus without complication ([place])   . Hypertension    Patient Active Problem List   Diagnosis Date Noted  . Cardiomyopathy ([place]) 10/27/2017  . 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 Hypertension 10/14/2017  . Medication management 10/14/2017  . Tachycardia 10/14/2017   Home Medication(s) Prior to Admission medications   Medication Sig Start Date End Date Taking? Authorizing Provider  AMOXICILLIN ER PO Take by mouth.   Yes [provider]  metoprolol succinate (TOPROL-XL) 50 MG 24 hr tablet Take 50 mg by mouth daily. Take with or immediately following a meal.   Yes [provider]  metoprolol tartrate (LOPRESSOR) 50 MG tablet Take 50 mg by mouth 2 (two) times daily.   Yes [provider]  albuterol (PROVENTIL  HFA;VENTOLIN HFA) 108 (90 Base) MCG/ACT inhaler Inhale 1-2 puffs into the lungs every 6 (six) hours as needed for wheezing or shortness of breath. 10/23/17   Argentina Donovan, PA-C  lisinopril (PRINIVIL,ZESTRIL) 10 MG tablet Take 1 tablet (10 mg total) by mouth daily. 10/23/17   Argentina Donovan, PA-C  metFORMIN (GLUCOPHAGE) 1000 MG tablet Take 1 tablet (1,000 mg total) by mouth every evening. Patient taking differently: Take 1,000 mg by mouth daily with supper.  10/23/17   Argentina Donovan, PA-C  metoprolol succinate (TOPROL-XL) 100 MG 24 hr tablet Take 1 tablet (100 mg total) by mouth daily. Take with or immediately following a meal. 10/27/17 01/25/18  [name], MD  pantoprazole (PROTONIX) 20 MG tablet Take 2 tablets (40 mg total) by mouth daily. Patient taking differently: Take 20 mg by mouth daily.  10/23/17   Argentina Donovan, PA-C  Past Surgical History Past Surgical History:  Procedure Laterality Date  . LEFT HEART CATH AND CORONARY ANGIOGRAPHY N/A 10/29/2017   Procedure: LEFT HEART CATH AND CORONARY ANGIOGRAPHY;  Surgeon: [name], MD;  Location: [facility];  Service: Cardiovascular;  Laterality: N/A;   Family History Family History  Family history unknown: Yes    Social History Social History   Tobacco Use  . Smoking status: Never Smoker  . Smokeless tobacco: Never Used  Substance Use Topics  . Alcohol use: Not Currently  . Drug use: Never   Allergies Patient has no known allergies.  Review of Systems Review of Systems All other systems are reviewed and are negative for acute change except as noted in the HPI  Physical Exam Vital Signs  I have reviewed the triage vital signs BP 132/90   Pulse 94   Resp 12   Ht 5\' 8"  (1.727 m)   Wt 117.9 kg   SpO2 100%   BMI 39.53 kg/m   Physical Exam  Constitutional: He is  oriented to person, place, and time. He appears well-developed and well-nourished. No distress.  HENT:  Head: Normocephalic and atraumatic.  Nose: Nose normal.  Eyes: Pupils are equal, round, and reactive to light. Conjunctivae and EOM are normal. Right eye exhibits no discharge. Left eye exhibits no discharge. No scleral icterus.  Neck: Normal range of motion. Neck supple.  Cardiovascular: Normal rate. A regularly irregular rhythm present. Exam reveals no gallop and no friction rub.  No murmur heard. Pulmonary/Chest: Effort normal and breath sounds normal. No stridor. No respiratory distress. He has no rales.  Abdominal: Soft. He exhibits no distension. There is no tenderness.  Musculoskeletal: He exhibits no edema or tenderness.  Neurological: He is alert and oriented to person, place, and time.  Skin: Skin is warm and dry. No rash noted. He is not diaphoretic. No erythema.  Psychiatric: He has a normal mood and affect.  Vitals reviewed.   ED Results and Treatments Labs (all labs ordered are listed, but only abnormal results are displayed) Labs Reviewed  CBC WITH DIFFERENTIAL/PLATELET - Abnormal; Notable for the following components:      Result Value   MCV 77.6 (*)    MCH 24.5 (*)    All other components within normal limits  BASIC METABOLIC PANEL - Abnormal; Notable for the following components:   CO2 20 (*)    Glucose, Bld 163 (*)    All other components within normal limits  MAGNESIUM                                                                                                                         EKG  EKG Interpretation  Date/Time:  Sunday November 09 2017 04:27:10 EDT Ventricular Rate:  107 PR Interval:    QRS Duration: 107 QT Interval:  337 QTC Calculation: 450 R Axis:   -23 Text Interpretation:  Sinus tachycardia Ventricular premature complex Borderline left axis deviation Low voltage,  extremity leads Otherwise no significant change Confirmed by Addison Lank  [phone]) on 11/09/2017 5:01:10 AM      Radiology No results found. Pertinent labs & imaging results that were available during my care of the patient were reviewed by me and considered in my medical decision making (see chart for details).  Medications Ordered in ED Medications - No data to display                                                                                                                                  Procedures Procedures  (including critical care time)  Medical Decision Making / ED Course I have reviewed the nursing notes for this encounter and the patient's prior records (if available in EHR or on provided paperwork).    Patient presents with rapid heart rate.  EKG with sinus tachycardia and infrequent PVCs.  No new ischemic changes.  Resolved spontaneously without intervention shortly after arrival.  Etiology of patient's symptoms undetermined at this time.  There appears to be a component of anxiety however he does have significant cardiomyopathy.  Patient does not have any evidence of volume overload on exam.  He is denying any associated chest pain or shortness of breath.  Recent catheterization was clean thus I doubt ACS.  Screening labs were obtained and did not reveal any electrolyte derangements or anemia.  Patient was monitored for several hours without recurrence of symptoms.  I have low suspicion for PE. He has had normal thyroid work up.   He already has an appointment with Cardiology in 3 days.   The patient appears reasonably screened and/or stabilized for discharge and I doubt any other medical condition or other [facility] requiring further screening, evaluation, or treatment in the ED at this time prior to discharge.   The patient is safe for discharge with strict return precautions.   Final Clinical Impression(s) / ED Diagnoses Final diagnoses:  Tachycardia   Disposition: Discharge  Condition: Good  I have discussed the results, Dx and  Tx plan with the patient who expressed understanding and agree(s) with the plan. Discharge instructions discussed at great length. The patient was given strict return precautions who verbalized understanding of the instructions. No further questions at time of discharge.    ED Discharge Orders    None       Follow Up: [name], [address] [place] [address] [phone]  Go to  as scheduled in 3 days      This chart was dictated using voice recognition software.  Despite best efforts to proofread,  errors can occur which can change the documentation meaning.   Fatima Blank, MD 11/09/17 [phone]

## 2017-11-12 ENCOUNTER — Telehealth: Payer: Self-pay

## 2017-11-12 ENCOUNTER — Ambulatory Visit (INDEPENDENT_AMBULATORY_CARE_PROVIDER_SITE_OTHER): Payer: Self-pay | Admitting: Physician Assistant

## 2017-11-12 ENCOUNTER — Encounter: Payer: Self-pay | Admitting: Physician Assistant

## 2017-11-12 ENCOUNTER — Telehealth: Payer: Self-pay | Admitting: Cardiology

## 2017-11-12 VITALS — BP 118/78 | HR 78 | Resp 16 | Ht 68.0 in | Wt 261.0 lb

## 2017-11-12 DIAGNOSIS — I5042 Chronic combined systolic (congestive) and diastolic (congestive) heart failure: Secondary | ICD-10-CM

## 2017-11-12 DIAGNOSIS — I428 Other cardiomyopathies: Secondary | ICD-10-CM

## 2017-11-12 NOTE — Telephone Encounter (Signed)
Returned call to patient-he states PA advised him to look up nonischemic cardiomyopathy. He states he looked this up and would like to speak with her about this diagnosis.     Advised would route to PA to make aware.

## 2017-11-12 NOTE — Telephone Encounter (Signed)
Returned the patient's call regarding things he had read since leaving the office today.  He had read that cardiomyopathy gave him a short life expectancy. I explained that that was probably all data, the recent advances and treatments and medications had really helped patient's improved significantly.  He wondered if he was going to start having problems with swelling. I advised that as long as he stuck to his low-sodium diet, not necessarily.  If he does, he is to contact us.  He wondered what [place] meant at the end of his diagnosis, he had seen HCM and wondered if it was the same thing. I advised him that [facility] was just a government acronym regarding billing and it did not mean anything else.  He wonders how he will know if his heart muscle is getting better. I advised that he would forget he had heart failure, the low-sodium diet would be second nature and he would feel well.  He had no other questions and was grateful for the call.  [name], PA-C 11/12/2017 5:11 PM [facility] [phone]

## 2017-11-12 NOTE — Telephone Encounter (Signed)
Called patient to advise that we had him down for an appointment today at 10:00, but note stated that his insurance would not take place until November 1st, I called patient, LVM that we could see him today, but if he would like to wait and move the appointment we could do that too. I advised patient to call us back to let us know.

## 2017-11-12 NOTE — Telephone Encounter (Signed)
Error

## 2017-11-12 NOTE — Telephone Encounter (Signed)
  Patient saw [name] today in the office and he has some questions about his diagnosis after doing some research at home.

## 2017-11-12 NOTE — Progress Notes (Signed)
Cardiology Office Note   Date:  11/12/2017   ID:  Bryan Greet., DOB 1965-10-10, MRN [id number]  PCP:  [name], MD Cardiologist:  [name], MD 10/27/2017 Bryan Ferries, PA-C   Chief Complaint  Patient presents with  . Tachycardia  . Anxiety  . Shortness of Breath    History of Present Illness: Bryan Craine. is a 52 y.o. male with a history of NICM w/ EF 20% by echo, CP, cath 10/29/2017 w/ minimal CAD, DM, HTN  Phone notes since cath regarding hypotension, beta-blocker reduced, now on Toprol-XL 50 mg daily  Bryan Greet. presents for cardiology follow up.  SBP 96-107 most of the time. HR 70s-90s  He is doing well with a low Na diet. He is losing weight steadily, no too fast.  No LE edema. No orthopnea or PND.  He has not felt short of breath with exertion.   No palpitations, no presyncope or syncope.  He gets occasional chest pain at different parts of his chest, but it is not significant and does not last very long, resolves spontaneously.  No idea what started all this.   He has infection in his mouth, being treated. He will get some teeth pulled.   He and his wife are walking a mile a day. Feels he could go longer.    Past Medical History:  Diagnosis Date  . Diabetes mellitus without complication ([place])   . Hypertension   . NICM (nonischemic cardiomyopathy) ([place]) 10/29/2017   EF 20% by echo, minimal CAD at cath    Past Surgical History:  Procedure Laterality Date  . LEFT HEART CATH AND CORONARY ANGIOGRAPHY N/A 10/29/2017   Procedure: LEFT HEART CATH AND CORONARY ANGIOGRAPHY;  Surgeon: [name], MD;  Location: [facility];  Service: Cardiovascular;  Laterality: N/A;    Current Outpatient Medications  Medication Sig Dispense Refill  . albuterol (PROVENTIL HFA;VENTOLIN HFA) 108 (90 Base) MCG/ACT inhaler Inhale 1-2 puffs into the lungs every 6 (six) hours as needed for wheezing or shortness  of breath. 1 Inhaler 2  . AMOXICILLIN ER PO Take 500 mg by mouth.     Marland Kitchen lisinopril (PRINIVIL,ZESTRIL) 10 MG tablet Take 1 tablet (10 mg total) by mouth daily. 90 tablet 0  . metoprolol succinate (TOPROL-XL) 100 MG 24 hr tablet Take 1 tablet (100 mg total) by mouth daily. Take with or immediately following a meal. 30 tablet 2  . metoprolol succinate (TOPROL-XL) 50 MG 24 hr tablet Take 50 mg by mouth daily. Take with or immediately following a meal.    . metoprolol tartrate (LOPRESSOR) 50 MG tablet Take 50 mg by mouth 2 (two) times daily.    . pantoprazole (PROTONIX) 20 MG tablet Take 2 tablets (40 mg total) by mouth daily. (Patient taking differently: Take 20 mg by mouth daily. ) 60 tablet 1  . metFORMIN (GLUCOPHAGE) 1000 MG tablet Take 1 tablet (1,000 mg total) by mouth every evening. (Patient not taking: Reported on 11/12/2017) 30 tablet 2   No current facility-administered medications for this visit.     Allergies:   Patient has no known allergies.    Social History:  The patient  reports that he has never smoked. He has never used smokeless tobacco. He reports that he drank alcohol. He reports that he does not use drugs.   Family History:  The patient's Family history is unknown by patient.  He indicated that his mother is deceased. He  indicated that his father is alive. He indicated that all of his three sisters are alive. He indicated that his brother is alive.   ROS:  Please see the history of present illness. All other systems are reviewed and negative.    PHYSICAL EXAM: VS:  BP 118/78   Pulse 78   Resp 16   Ht 5\' 8"  (1.727 m)   Wt 261 lb (118.4 kg)   SpO2 97%   BMI 39.68 kg/m  , BMI Body mass index is 39.68 kg/m. GEN: Well nourished, well developed, male in no acute distress HEENT: normal for age  Neck: no JVD, no carotid bruit, no masses Cardiac: RRR; no murmur, no rubs, or gallops Respiratory:  clear to auscultation bilaterally, normal work of breathing GI: soft,  nontender, nondistended, + BS MS: no deformity or atrophy; no edema; distal pulses are 2+ in all 4 extremities  Skin: warm and dry, no rash Neuro:  Strength and sensation are intact Psych: euthymic mood, full affect   EKG:  EKG is not ordered today.  ECHO: 10/22/2017 - Left ventricle: The cavity size was severely dilated. Systolic   function was severely reduced. The estimated ejection fraction   was in the range of 20% to 25%. Diffuse hypokinesis. Features are   consistent with a pseudonormal left ventricular filling pattern,   with concomitant abnormal relaxation and increased filling   pressure (grade 2 diastolic dysfunction). - Aortic valve: Transvalvular velocity was within the normal range.   There was no stenosis. There was no regurgitation. - Mitral valve: Transvalvular velocity was within the normal range.   There was no evidence for stenosis. There was mild regurgitation. - Left atrium: The atrium was moderately to severely dilated. - Right ventricle: The cavity size was normal. Wall thickness was   normal. Systolic function was normal. - Atrial septum: No defect or patent foramen ovale was identified   by color flow Doppler. - Tricuspid valve: There was trivial regurgitation. - Pulmonary arteries: Systolic pressure was within the normal   range. PA peak pressure: 17 mm Hg (S). - Global longitudinal strain -12.8% (abnormal).  CATH: 10/29/2017  Prox RCA lesion is 30% stenosed.  No significant coronary obstructive disease with smooth 25 -30% proximal RCA narrowing and a normal left coronary circulation.  LVEDP 17 mmHg.  Previous echo Doppler documented EF of 20 to 25%.  RECOMMENDATION: Guideline directed medical therapy for  nonischemic cardiomyopathy.  The patient will follow-up with Dr. Percival Spanish for further evaluation and treatment. Recommend Aspirin 81mg  daily for moderate CAD.   Recent Labs: 10/14/2017: TSH 1.600 10/17/2017: ALT 26 11/09/2017: BUN 15;  Creatinine, Ser 1.06; Hemoglobin 14.0; Magnesium 2.0; Platelets 245; Potassium 3.6; Sodium 135  CBC    Component Value Date/Time   WBC 9.1 11/09/2017 0502   RBC 5.71 11/09/2017 0502   HGB 14.0 11/09/2017 0502   HCT 44.3 11/09/2017 0502   PLT 245 11/09/2017 0502   MCV 77.6 (L) 11/09/2017 0502   MCH 24.5 (L) 11/09/2017 0502   MCHC 31.6 11/09/2017 0502   RDW 13.0 11/09/2017 0502   LYMPHSABS 2.6 11/09/2017 0502   MONOABS 0.7 11/09/2017 0502   EOSABS 0.2 11/09/2017 0502   BASOSABS 0.0 11/09/2017 0502   CMP Latest Ref Rng & Units 11/09/2017 10/20/2017 10/17/2017  Glucose 70 - 99 mg/dL 163(H) 148(H) 176(H)  BUN 6 - 20 mg/dL 15 15 22(H)  Creatinine 0.61 - 1.24 mg/dL 1.06 0.99 1.18  Sodium 135 - 145 mmol/L 135 138 136  Potassium  3.5 - 5.1 mmol/L 3.6 3.5 3.6  Chloride 98 - 111 mmol/L 101 102 105  CO2 22 - 32 mmol/L 20(L) 24 18(L)  Calcium 8.9 - 10.3 mg/dL 9.2 9.3 9.4  Total Protein 6.5 - 8.1 g/dL - - 8.1  Total Bilirubin 0.3 - 1.2 mg/dL - - 0.6  Alkaline Phos 38 - 126 U/L - - 64  AST 15 - 41 U/L - - 16  ALT 0 - 44 U/L - - 26     Lipid Panel No results found for: CHOL, TRIG, HDL, CHOLHDL, VLDL, LDLCALC, LDLDIRECT   Wt Readings from Last 3 Encounters:  11/12/17 261 lb (118.4 kg)  11/09/17 260 lb (117.9 kg)  10/29/17 260 lb (117.9 kg)     Other studies Reviewed: Additional studies/ records that were reviewed today include: Office notes, hospital records and testing.  ASSESSMENT AND PLAN:  1.  Chronic combined systolic and diastolic CHF: His weight is trending down gradually and he is having no symptoms of dehydration. - Renal function and electrolytes were stable on BMET 10/27. -Continue current therapies, daily weights, sodium restrictions and watching fluids as well.  2.  NICM: His blood pressure is well controlled/low normal on current therapy. -I explained the concept of manipulating medications to get him on as many of the meds that will help his heart get stronger as we  can. -I explained that I could not just add medications because his blood pressure is too low and I am worried that he will get symptomatic. -Discuss with Dr. Percival Spanish if it is worth it to try adding low-dose Entresto and stopping the lisinopril -In order to get Entresto on board, we might have to cut back further on the metoprolol which I am loath to do -Recheck echocardiogram after 3 months on the medications.   Current medicines are reviewed at length with the patient today.  The patient has concerns regarding medicines.  Concerns were addressed  The following changes have been made:  no change  Labs/ tests ordered today include:   Orders Placed This Encounter  Procedures  . ECHOCARDIOGRAM COMPLETE     Disposition:   FU with [name], MD  Signed, Bryan Ferries, PA-C  11/12/2017 12:52 PM    [facility] Phone: [phone]; Fax: [phone]  This note was written with the assistance of speech recognition software.  Please excuse any transcriptional errors.

## 2017-11-12 NOTE — Telephone Encounter (Signed)
Follow up   Patient states that he will be coming into the 10:00 am appt today.

## 2017-11-12 NOTE — Patient Instructions (Addendum)
Medication Instructions:  No Changes. If you need a refill on your cardiac medications before your next appointment, please call your pharmacy.   Lab work: None Ordered. If you have labs (blood work) drawn today and your tests are completely normal, you will receive your results only by: Marland Kitchen MyChart Message (if you have MyChart) OR . A paper copy in the mail If you have any lab test that is abnormal or we need to change your treatment, we will call you to review the results.  Testing/Procedures: Your physician has requested that you have an echocardiogram. Echocardiography is a painless test that uses sound waves to create images of your heart. It provides your doctor with information about the size and shape of your heart and how well your heart's chambers and valves are working. This procedure takes approximately one hour. There are no restrictions for this procedure.  Follow-Up: At [facility], you and your health needs are our priority.  As part of our continuing mission to provide you with exceptional heart care, we have created designated Provider Care Teams.  These Care Teams include your primary Cardiologist (physician) and Advanced Practice Providers (APPs -  Physician Assistants and Nurse Practitioners) who all work together to provide you with the care you need, when you need it. You will need a follow up appointment in 3 months.After ECHO.  You may see [name], MD or one of the following Advanced Practice Providers on your designated Care Team:   [name], PA-C . Jory Sims, DNP, ANP  Any Other Special Instructions Will Be Listed Below (If Applicable). Continue daily weights. Start Low Sodium, Diabetic Diet Call us with any SOB or swelling in legs. Look up information for Non-Ischemic Cardiomyopathy

## 2017-11-24 ENCOUNTER — Encounter: Payer: Self-pay | Admitting: Family Medicine

## 2017-11-24 ENCOUNTER — Ambulatory Visit: Payer: Self-pay | Attending: Family Medicine | Admitting: Family Medicine

## 2017-11-24 VITALS — BP 138/83 | HR 82 | Resp 18 | Ht 68.0 in | Wt 251.2 lb

## 2017-11-24 DIAGNOSIS — I428 Other cardiomyopathies: Secondary | ICD-10-CM | POA: Insufficient documentation

## 2017-11-24 DIAGNOSIS — I2510 Atherosclerotic heart disease of native coronary artery without angina pectoris: Secondary | ICD-10-CM | POA: Insufficient documentation

## 2017-11-24 DIAGNOSIS — R10A2 Flank pain, left side: Secondary | ICD-10-CM

## 2017-11-24 DIAGNOSIS — E1165 Type 2 diabetes mellitus with hyperglycemia: Secondary | ICD-10-CM

## 2017-11-24 DIAGNOSIS — Z2821 Immunization not carried out because of patient refusal: Secondary | ICD-10-CM | POA: Insufficient documentation

## 2017-11-24 DIAGNOSIS — R000 Tachycardia, unspecified: Secondary | ICD-10-CM | POA: Insufficient documentation

## 2017-11-24 DIAGNOSIS — R109 Unspecified abdominal pain: Secondary | ICD-10-CM

## 2017-11-24 DIAGNOSIS — Z6838 Body mass index (BMI) 38.0-38.9, adult: Secondary | ICD-10-CM

## 2017-11-24 DIAGNOSIS — K219 Gastro-esophageal reflux disease without esophagitis: Secondary | ICD-10-CM

## 2017-11-24 DIAGNOSIS — I5042 Chronic combined systolic (congestive) and diastolic (congestive) heart failure: Secondary | ICD-10-CM | POA: Insufficient documentation

## 2017-11-24 DIAGNOSIS — Z79899 Other long term (current) drug therapy: Secondary | ICD-10-CM

## 2017-11-24 DIAGNOSIS — I110 Hypertensive heart disease with heart failure: Secondary | ICD-10-CM | POA: Insufficient documentation

## 2017-11-24 DIAGNOSIS — R8290 Unspecified abnormal findings in urine: Secondary | ICD-10-CM

## 2017-11-24 LAB — POCT URINALYSIS DIP (CLINITEK)
Blood, UA: NEGATIVE
Glucose, UA: NEGATIVE mg/dL
Leukocytes, UA: NEGATIVE
Nitrite, UA: NEGATIVE
Spec Grav, UA: >=1.03 — AB
Urobilinogen, UA: 1 U/dL
pH, UA: 5.5

## 2017-11-24 NOTE — Progress Notes (Signed)
Subjective:    Patient ID: Bryan Greet., male    DOB: 03-04-1965, 52 y.o.   MRN: [id number]  HPI 52 year old male new to me as a patient who was initially seen by another provider to establish care on 10/23/2017 for chronic medical issues including hypertension and diabetes.  Patient's A1c at that time was 9.2.  Patient also with a history of nonischemic cardiomyopathy with ejection fraction 20%, chronic combined systolic and diastolic heart failure as well as a history of tachycardia for which patient has been followed by cardiology with most recent visit on 11/12/2017.en       Patient reports that he is feeling well at today's visit.  Patient states that he is no longer taking the metformin.  Patient had previously been on 1000 mg twice daily.  Patient reports that the medication was causing him to have increased acid reflux symptoms including backwash of fluid into his throat.  Patient states that at his last visit here he was placed on reflux medication and that he has not taken metformin since that time.  Patient states that overall his blood sugars are pretty good.  Patient reports fasting blood sugars in the 140s or less and patient has a picture of his glucometer showing one reading of 86.  Patient states that the blood sugar of 86 was taken at about 4 PM.  Patient does not otherwise have his glucometer or a blood sugar log with him.  Patient states that he is trying to get off of all medications for his diabetes as patient states that his blood sugars when he was initially diagnosed were in the 3-4 100s and now he has some down to the 100s to 200s.  Patient denies any current urinary frequency, no increased thirst and no blurred vision.  Patient has had 2 to 3 days onset of left lower flank discomfort.  Patient denies any abdominal pain- no nausea/vomiting/diarrhea or constipation, no blood in the stools and no dark stools.      Patient denies any headaches or dizziness related to his  blood pressure.  Patient has had some mild frontal headaches which have been dull.  Patient does have issues with nasal congestion.  According to patient's wife, patient does snore.  Wife is witnessed no apneic episodes.  Patient does have some daytime fatigue.  Patient states that he is trying to eat healthy and exercise.  Patient is adopted and does not know his family history.  Patient has no history of tobacco or alcohol use.  Patient is not currently employed.      Patient denies any chest pain, no shortness of breath and patient has had no peripheral edema.  Patient is taking medications as prescribed by cardiology.  Patient reports that dizziness/lightheadedness has resolved since his dose of metoprolol was decreased. Past Medical History:  Diagnosis Date  . Diabetes mellitus without complication ([place])   . Hypertension   . NICM (nonischemic cardiomyopathy) ([place]) 10/29/2017   EF 20% by echo, minimal CAD at cath   Past Surgical History:  Procedure Laterality Date  . LEFT HEART CATH AND CORONARY ANGIOGRAPHY N/A 10/29/2017   Procedure: LEFT HEART CATH AND CORONARY ANGIOGRAPHY;  Surgeon: [name], MD;  Location: [facility];  Service: Cardiovascular;  Laterality: N/A;   Family History  Adopted: Yes  Family history unknown: Yes   Social History   Tobacco Use  . Smoking status: Never Smoker  . Smokeless tobacco: Never Used  Substance Use  Topics  . Alcohol use: Not Currently  . Drug use: Never  No Known Allergies Review of Systems      Objective:   Physical Exam BP 138/83 (BP Location: Right Arm, Patient Position: Sitting, Cuff Size: Large)   Pulse 82   Resp 18   Ht 5\' 8"  (1.727 m)   Wt 251 lb 3.2 oz (113.9 kg)   BMI 38.19 kg/m Vital signs and nurse's notes reviewed General- well-nourished, well-developed overweight for height/obese male in no acute distress.  Patient is accompanied by his wife at today's visit. ENT- TMs dull, nares with edema/erythema of the nasal  turbinates and patient with left-sided yellow-green nasal discharge.  Patient does not have any tenderness to palpation of the frontal and maxillary sinuses.  Patient with edema and erythema of the posterior pharynx/tonsillar arch/uvula.  Patient with a slightly narrowed posterior airway due to body habitus and large tongue base. Neck-supple, no lymphadenopathy, no thyromegaly, no carotid bruit Cardiovascular-regular rate and rhythm Lungs-clear to auscultation bilaterally Abdomen-soft, truncal obesity, no reproducible abdominal pain Back- no CVA tenderness.  Patient with mild discomfort with palpation of the left lateral side/flank below the CVA area.  No reproducible back pain Extremities-no edema Diabetic foot exam- patient with normal monofilament exam in 10 out of 10 areas tested.  Patient has no active skin breakdown on the feet.  Patient with mild dry skin on the feet.  Patient does not have any pre-ulcerative calluses.  Patient does have some mild thickening/discoloration of some of the toenails.  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 Type 2 diabetes mellitus with hyperglycemia, without long-term current use of insulin (HCC) Patient was given pamphlet regarding hemoglobin A1c's and discussed with the patient that at 9.2, his average blood sugar was around the low 200s.  Discussed A1c goal of 7.0.  Patient was asked to restart metformin initially taking half tablet, 500 mg, once daily after the evening meal and if he tolerates this okay then increase in 2 weeks to 1000 mg after the evening meal.  Continue to monitor blood sugars.  Goal fasting blood sugar between 100-130 and would like to see patient's blood sugar postprandially 140 or less 2 hours after eating.  Patient requested to have  A1c done at today's visit but I discussed with the patient that this averages the blood sugar over 90 days therefore patient will return in 1 month to have lab visit with a nurse for POCT hemoglobin A1c.  Patient will have lipid panel and microalbumin as well as hepatic function panel at today's visit. - Microalbumin/Creatinine Ratio, Urine - Lipid panel - Hepatic Function Panel  2. Gastroesophageal reflux disease, esophagitis presence not specified Patient reports that metformin was causing increased acid reflux symptoms.  Patient was placed on Protonix at his last visit but patient is taking only 20 mg once daily.  Patient reports decreased to absent reflux symptoms.  3. Left flank pain Patient with complaint of recent onset left flank pain for the past 2 to 3 days.  Patient will have urinalysis done in follow-up though the pain is actually lower than the CVA area and I discussed with the patient that the pain is likely musculoskeletal in origin.  Patient can take OTC pain medication as needed - POCT  URINALYSIS DIP (CLINITEK)  4. Class 2 severe obesity with serious comorbidity and body mass index (BMI) of 38.0 to 38.9 in adult, unspecified obesity type [facility]) Patient with obesity and he was encouraged to continue efforts at weight loss through dietary changes and exercise but also to take the metformin to help reduce insulin resistance.  5. Encounter for long-term (current) use of medications Patient with abnormal urinalysis showing large bilirubin.  Patient will have hepatic function panel as patient also will have lipid panel at today's visit and statin medication is recommended due to his history of diabetes and patient also with CAD on recent heart cath though this was minimal. - Hepatic Function Panel  6. Abnormal urinalysis Patient with presence of large bilirubin on urinalysis and patient will have hepatic panel in follow-up patient did not have any nitrites, leukocytes or blood  suggestive of the need for treatment of UTI or culture of the urine.  *Influenza immunization was offered at today's visit but declined by the patient  An After Visit Summary was printed and given to the patient.  Return in about 4 months (around 03/25/2018) for ;1 month nurse visit-lab. - Hepatic Function Panel

## 2017-11-25 LAB — LIPID PANEL
Chol/HDL Ratio: 7.4 ratio — ABNORMAL HIGH (ref 0.0–5.0)
Cholesterol, Total: 214 mg/dL — ABNORMAL HIGH (ref 100–199)
HDL: 29 mg/dL — ABNORMAL LOW
LDL Calculated: 155 mg/dL — ABNORMAL HIGH (ref 0–99)
Triglycerides: 150 mg/dL — ABNORMAL HIGH (ref 0–149)
VLDL Cholesterol Cal: 30 mg/dL (ref 5–40)

## 2017-11-25 LAB — MICROALBUMIN / CREATININE URINE RATIO
Creatinine, Urine: 514.2 mg/dL
Microalb/Creat Ratio: 7.1 mg/g{creat} (ref 0.0–30.0)
Microalbumin, Urine: 36.3 ug/mL

## 2017-11-26 ENCOUNTER — Telehealth: Payer: Self-pay | Admitting: Family Medicine

## 2017-11-26 LAB — SPECIMEN STATUS REPORT

## 2017-11-26 LAB — HEPATIC FUNCTION PANEL
ALT: 26 IU/L (ref 0–44)
AST: 22 IU/L (ref 0–40)
Albumin: 4.4 g/dL (ref 3.5–5.5)
Alkaline Phosphatase: 85 IU/L (ref 39–117)
Bilirubin Total: 0.3 mg/dL (ref 0.0–1.2)
Bilirubin, Direct: 0.11 mg/dL (ref 0.00–0.40)
Total Protein: 7.5 g/dL (ref 6.0–8.5)

## 2017-11-26 NOTE — Telephone Encounter (Signed)
Patient called requesting to speak to his nurse to go over his lab results. Please f/u.

## 2017-11-27 NOTE — Telephone Encounter (Signed)
Patient verified DOB Patient is aware of results and will pickup cholesterol medication from [facility] pharmacy.

## 2017-11-28 ENCOUNTER — Telehealth: Payer: Self-pay | Admitting: *Deleted

## 2017-11-28 NOTE — Telephone Encounter (Signed)
Pt request ATB for sinus infection He also request that glucometer be sent to the pharmacy.

## 2017-11-28 NOTE — Telephone Encounter (Signed)
Pt came in requesting his new medications sent to [facility] pharmacy Please follow up

## 2017-12-01 NOTE — Telephone Encounter (Signed)
ok 

## 2017-12-02 ENCOUNTER — Other Ambulatory Visit: Payer: Self-pay | Admitting: Family Medicine

## 2017-12-02 DIAGNOSIS — J019 Acute sinusitis, unspecified: Secondary | ICD-10-CM

## 2017-12-02 DIAGNOSIS — E1165 Type 2 diabetes mellitus with hyperglycemia: Secondary | ICD-10-CM

## 2017-12-02 MED ORDER — TRUEPLUS LANCETS 28G MISC: 3 refills | Status: AC

## 2017-12-02 MED ORDER — GLUCOSE BLOOD VI STRP: ORAL_STRIP | 3 refills | Status: DC

## 2017-12-02 MED ORDER — TRUE METRIX AIR GLUCOSE METER W/DEVICE KIT: 1.0000 | PACK | Freq: Two times a day (BID) | 0 refills | Status: DC

## 2017-12-02 MED ORDER — AMOXICILLIN-POT CLAVULANATE 500-125 MG PO TABS: 1.0000 | ORAL_TABLET | Freq: Two times a day (BID) | ORAL | 0 refills | Status: DC

## 2017-12-02 NOTE — Progress Notes (Signed)
Patient ID: Bryan Greet., male   DOB: [birth year], 52 y.o.   MRN: [id number]   Patient had evidence of sinusitis at his last visit.  Patient at that time declined antibiotic therapy.  Patient called recently to request that antibiotic be sent to pharmacy for treatment of sinusitis.  Patient also would like prescription sent to this pharmacy for glucometer and testing supplies.  Prescription for Augmentin 500 mg twice daily x7 days will be sent to this pharmacy as well as prescription for glucometer strips and testing supplies for twice daily testing.

## 2017-12-02 NOTE — Telephone Encounter (Signed)
Please send ATB for sinus infection ( per request and states he discuss in last OV) and glucometer.

## 2017-12-02 NOTE — Telephone Encounter (Signed)
Done- Rx for an antibiotic and glucometer and testing supplies sent to this pharmacy

## 2017-12-04 ENCOUNTER — Other Ambulatory Visit: Payer: Self-pay | Admitting: Family Medicine

## 2017-12-04 ENCOUNTER — Telehealth: Payer: Self-pay | Admitting: Family Medicine

## 2017-12-04 DIAGNOSIS — Z79899 Other long term (current) drug therapy: Secondary | ICD-10-CM

## 2017-12-04 DIAGNOSIS — E785 Hyperlipidemia, unspecified: Secondary | ICD-10-CM

## 2017-12-04 MED ORDER — ATORVASTATIN CALCIUM 20 MG PO TABS: 20.0000 mg | ORAL_TABLET | Freq: Every day | ORAL | 3 refills | Status: DC

## 2017-12-04 NOTE — Telephone Encounter (Signed)
Please let patient know his cholesterol medicine has been sent to the pharmacy.  Patient should also make a follow-up lab visit in 4 to 6 weeks after starting the new cholesterol medication.  Future lab order has already been placed for recheck of liver enzymes

## 2017-12-04 NOTE — Telephone Encounter (Signed)
Patient went to pickup cholesterol medication from [facility] pharmacy the medication was not there please send the new medication over.

## 2017-12-04 NOTE — Progress Notes (Signed)
Patient ID: Bryan Greet., male   DOB: 01/29/1965, 52 y.o.   MRN: [id number]   Patient called to report that his cholesterol medication was not at the pharmacy.  Prescription sent to pharmacy for atorvastatin 20 mg once daily and patient had lab order placed to recheck hepatic panel in 4 to 6 weeks after he starts the cholesterol medication

## 2017-12-04 NOTE — Telephone Encounter (Signed)
I called the patient and informed him that he can pick his meds up and inform us when he starts taken it to schedule labs

## 2017-12-22 ENCOUNTER — Telehealth: Payer: Self-pay | Admitting: Cardiology

## 2017-12-22 MED ORDER — LISINOPRIL 10 MG PO TABS: 10.0000 mg | ORAL_TABLET | Freq: Every day | ORAL | 3 refills | Status: DC

## 2017-12-22 MED ORDER — METOPROLOL SUCCINATE ER 50 MG PO TB24: 50.0000 mg | ORAL_TABLET | Freq: Every day | ORAL | 3 refills | Status: DC

## 2017-12-22 NOTE — Telephone Encounter (Signed)
Refill sent to the pharmacy electronically.  

## 2017-12-22 NOTE — Telephone Encounter (Signed)
°*  STAT* If patient is at the pharmacy, call can be transferred to refill team.   1. Which medications need to be refilled? (please list name of each medication and dose if known)   metoprolol succinate (TOPROL-XL) 50 MG 24 hr tablet  lisinopril (PRINIVIL,ZESTRIL) 10 MG tablet    2. Which pharmacy/location (including street and city if local pharmacy) is medication to be sent to?  [place], [place]   3. Do they need a 30 day or 90 day supply? 90 Days  Patient states his is out of his Metoprolol succinate.

## 2018-01-01 ENCOUNTER — Other Ambulatory Visit (HOSPITAL_COMMUNITY): Payer: Self-pay | Admitting: Urgent Care

## 2018-01-05 ENCOUNTER — Other Ambulatory Visit: Payer: Self-pay | Admitting: Physician Assistant

## 2018-01-19 ENCOUNTER — Other Ambulatory Visit: Payer: Self-pay

## 2018-01-19 ENCOUNTER — Ambulatory Visit (HOSPITAL_COMMUNITY): Payer: BLUE CROSS/BLUE SHIELD | Attending: Cardiology

## 2018-01-19 DIAGNOSIS — I428 Other cardiomyopathies: Secondary | ICD-10-CM | POA: Insufficient documentation

## 2018-01-24 NOTE — Progress Notes (Signed)
Cardiology Office Note   Date:  01/26/2018   ID:  [name], DOB Jan 03, 1966, MRN [id number]  PCP:  [name], MD  Cardiologist:   [name], MD    Chief Complaint  Patient presents with  . Cardiomyopathy      History of Present Illness: Bryan English. is a 53 y.o. male who was referred by himself for evaluation of chest pain.    He had had multiple ED visits for chest pain. He was not found to have any evidence for ischemia.  In a previous visit in August he had had negative CT for PE.  He did have coronary calcification in the LAD.   When I saw him I sent him for an echo.  He was found to have an EF of 20%.  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   He has not been in the ED since late October.   His EF this month was slightly better with an EF of 25 - 30%.    Since I last saw him he has felt much better.  His heart rate has been lower.  He has been breathing better.  He is been walking for exercise.  He is lost about 10 pounds. The patient denies any new symptoms such as chest discomfort, neck or arm discomfort. There has been no new shortness of breath, PND or orthopnea. There have been no reported palpitations, presyncope or syncope.    Past Medical History:  Diagnosis Date  . Diabetes mellitus without complication ([place])   . Hypertension   . NICM (nonischemic cardiomyopathy) ([place]) 10/29/2017   EF 20% by echo, minimal CAD at cath    Past Surgical History:  Procedure Laterality Date  . LEFT HEART CATH AND CORONARY ANGIOGRAPHY N/A 10/29/2017   Procedure: LEFT HEART CATH AND CORONARY ANGIOGRAPHY;  Surgeon: [name], MD;  Location: [facility];  Service: Cardiovascular;  Laterality: N/A;     Current Outpatient Medications  Medication Sig Dispense Refill  . albuterol (PROVENTIL HFA;VENTOLIN HFA) 108 (90 Base) MCG/ACT inhaler Inhale 1-2 puffs into the lungs every 6 (six) hours as needed for wheezing or shortness of breath.  1 Inhaler 2  . atorvastatin (LIPITOR) 20 MG tablet Take 1 tablet (20 mg total) by mouth daily. 90 tablet 3  . Blood Glucose Monitoring Suppl (TRUE METRIX AIR GLUCOSE METER) w/Device KIT 1 kit by Does not apply route 2 (two) times daily. 1 kit 0  . glucose blood (TRUE METRIX BLOOD GLUCOSE TEST) test strip Use as instructed to check blood sugars twice daily and as needed 100 each 3  . lisinopril (PRINIVIL,ZESTRIL) 10 MG tablet Take 1 tablet (10 mg total) by mouth daily. 90 tablet 3  . metFORMIN (GLUCOPHAGE) 1000 MG tablet Take 1 tablet (1,000 mg total) by mouth every evening. 30 tablet 2  . metoprolol succinate (TOPROL-XL) 100 MG 24 hr tablet Take 1 tablet (100 mg total) by mouth daily. Take with or immediately following a meal. 90 tablet 3  . pantoprazole (PROTONIX) 20 MG tablet TAKE 2 TABLETS BY MOUTH DAILY. 60 tablet 1  . TRUEPLUS LANCETS 28G MISC Use twice daily to check blood sugars and as needed 100 each 3   No current facility-administered medications for this visit.     Allergies:   Patient has no known allergies.    ROS:  Please see the history of present illness.   Otherwise, review of systems are positive for  none.   All other systems are reviewed and negative.    PHYSICAL EXAM: VS:  BP (!) 142/78   Pulse 84   Ht '5\' 8"'$  (1.727 m)   Wt 252 lb 12.8 oz (114.7 kg)   BMI 38.44 kg/m  , BMI Body mass index is 38.44 kg/m. GENERAL:  Well appearing NECK:  No jugular venous distention, waveform within normal limits, carotid upstroke brisk and symmetric, no bruits, no thyromegaly LUNGS:  Clear to auscultation bilaterally CHEST:  Unremarkable HEART:  PMI not displaced or sustained,S1 and S2 within normal limits, no S3, no S4, no clicks, no rubs, no murmurs ABD:  Flat, positive bowel sounds normal in frequency in pitch, no bruits, no rebound, no guarding, no midline pulsatile mass, no hepatomegaly, no splenomegaly EXT:  2 plus pulses throughout, no edema, no cyanosis no clubbing   EKG:   EKG is not ordered today.    Recent Labs: 10/14/2017: TSH 1.600 11/09/2017: BUN 15; Creatinine, Ser 1.06; Hemoglobin 14.0; Magnesium 2.0; Platelets 245; Potassium 3.6; Sodium 135 11/24/2017: ALT 26    Lipid Panel    Component Value Date/Time   CHOL 214 (H) 11/24/2017 1144   TRIG 150 (H) 11/24/2017 1144   HDL 29 (L) 11/24/2017 1144   CHOLHDL 7.4 (H) 11/24/2017 1144   LDLCALC 155 (H) 11/24/2017 1144      Wt Readings from Last 3 Encounters:  01/26/18 252 lb 12.8 oz (114.7 kg)  11/24/17 251 lb 3.2 oz (113.9 kg)  11/12/17 261 lb (118.4 kg)      Other studies Reviewed: Additional studies/ records that were reviewed today include: Cath Review of the above records demonstrates:   ASSESSMENT AND PLAN:   CARDIOMYOPATHY:    EF is slightly improved.  I am getting continue to titrate his medicines.  Will increase his metoprolol.  We will go to 100 mg daily.  At the next appointment we should consider changing to [facility].   DM:    This was a new diagnosis.  This is followed by his [name], [name], MD  TACHYCARDIA:      This is being managed in the context of treating his CHF   Current medicines are reviewed at length with the patient today.  The patient does not have concerns regarding medicines.  The following changes have been made:  As above   Labs/ tests ordered today include:  None  No orders of the defined types were placed in this encounter.    Disposition:   FU with [name], [name] in one month.     Signed, [name], MD  01/26/2018 12:36 PM    [facility]

## 2018-01-26 ENCOUNTER — Encounter: Payer: Self-pay | Admitting: Cardiology

## 2018-01-26 ENCOUNTER — Ambulatory Visit (INDEPENDENT_AMBULATORY_CARE_PROVIDER_SITE_OTHER): Payer: BLUE CROSS/BLUE SHIELD | Admitting: Cardiology

## 2018-01-26 VITALS — BP 142/78 | HR 84 | Ht 68.0 in | Wt 252.8 lb

## 2018-01-26 DIAGNOSIS — I420 Dilated cardiomyopathy: Secondary | ICD-10-CM | POA: Diagnosis not present

## 2018-01-26 MED ORDER — METOPROLOL SUCCINATE ER 100 MG PO TB24: 100.0000 mg | ORAL_TABLET | Freq: Every day | ORAL | 3 refills | Status: DC

## 2018-01-26 NOTE — Patient Instructions (Signed)
Medication Instructions:  INCREASE- Metoprolol 100 mg daily  If you need a refill on your cardiac medications before your next appointment, please call your pharmacy.  Labwork: None Ordered   Take the provided lab slips with you to the lab for your blood draw.  When you have your labs (blood work) drawn today and your tests are completely normal, you will receive your results only by MyChart Message (if you have MyChart) -OR-  A paper copy in the mail.  If you have any lab test that is abnormal or we need to change your treatment, we will call you to review these results.  Testing/Procedures: None Ordered   Follow-Up: . Your physician recommends that you schedule a follow-up appointment in: 1 Month with [name]   At [facility], you and your health needs are our priority.  As part of our continuing mission to provide you with exceptional heart care, we have created designated Provider Care Teams.  These Care Teams include your primary Cardiologist (physician) and Advanced Practice Providers (APPs -  Physician Assistants and Nurse Practitioners) who all work together to provide you with the care you need, when you need it.  Thank you for choosing [facility] at [facility]!!

## 2018-02-04 ENCOUNTER — Other Ambulatory Visit (HOSPITAL_COMMUNITY): Payer: Self-pay | Admitting: Urgent Care

## 2018-02-13 ENCOUNTER — Encounter: Payer: Self-pay | Admitting: Cardiology

## 2018-02-13 ENCOUNTER — Telehealth: Payer: Self-pay | Admitting: Cardiology

## 2018-02-13 ENCOUNTER — Ambulatory Visit: Payer: BLUE CROSS/BLUE SHIELD | Admitting: Cardiology

## 2018-02-13 VITALS — BP 152/90 | HR 109 | Ht 68.0 in | Wt 255.2 lb

## 2018-02-13 DIAGNOSIS — I420 Dilated cardiomyopathy: Secondary | ICD-10-CM

## 2018-02-13 DIAGNOSIS — R000 Tachycardia, unspecified: Secondary | ICD-10-CM | POA: Diagnosis not present

## 2018-02-13 MED ORDER — METOPROLOL SUCCINATE ER 200 MG PO TB24: 200.0000 mg | ORAL_TABLET | Freq: Every day | ORAL | 3 refills | Status: DC

## 2018-02-13 NOTE — Patient Instructions (Signed)
Medication Instructions:  INCREASE- Metoprolol 200 mg daily  If you need a refill on your cardiac medications before your next appointment, please call your pharmacy.  Labwork: None Ordered   Take the provided lab slips with you to the lab for your blood draw.   When you have your labs (blood work) drawn today and your tests are completely normal, you will receive your results only by MyChart Message (if you have MyChart) -OR-  A paper copy in the mail.  If you have any lab test that is abnormal or we need to change your treatment, we will call you to review these results.  Testing/Procedures: None Ordered   Follow-Up: . Your physician recommends that you schedule a follow-up appointment in: 1 Months with [name]   At [facility], you and your health needs are our priority.  As part of our continuing mission to provide you with exceptional heart care, we have created designated Provider Care Teams.  These Care Teams include your primary Cardiologist (physician) and Advanced Practice Providers (APPs -  Physician Assistants and Nurse Practitioners) who all work together to provide you with the care you need, when you need it.  Thank you for choosing [facility] at [facility]!!

## 2018-02-13 NOTE — Telephone Encounter (Signed)
Spoke with pt, he woke up this morning with a heart rate of 122. He has taken his metoprolol at 10:30 this morning and his heart rate now is running 99-107 bpm. The only symptom he has is acid reflux and feels "gasy". He can not tell if the pulse is regular or not it is just fast. He wanted to be seen and I placed him on dr hochrein's schedule this afternoon but if his heart rate returns to normal he may call back and cancel. He retorts this is the first time this has ever happened.

## 2018-02-13 NOTE — Progress Notes (Signed)
Cardiology Office Note   Date:  02/13/2018   ID:  Bryan Greet., DOB [birth year], MRN [id number]  PCP:  [name], MD  Cardiologist:   [name], MD    Chief Complaint  Patient presents with  . Tachycardia      History of Present Illness: Bryan English. is a 53 y.o. male who was referred by himself for evaluation of chest pain.    He had had multiple ED visits for chest pain. He was not found to have any evidence for ischemia.  In a previous visit in August he had had negative CT for PE.  He did have coronary calcification in the LAD.   When I saw him I sent him for an echo.  He was found to have an EF of 20%.  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   He has not been in the ED since late October.   His EF this month was slightly better with an EF of 25 - 30%.   He called today with increased heart rate.    He has been feeling well but noticed that his heart rate was in the 120s.  He took his beta-blocker and it did come down.  He is been having typically heart rates in the 70s so the fact that it was 100 and higher even after taking his medications alarmed him.  He has had a little abdominal bloating.  He thought maybe he was slightly dehydrated and he drank a little extra water this morning.  Otherwise he has been feeling really well.  He denies any new shortness of breath, PND or orthopnea.  He has not had any palpitations, presyncope or syncope.  He has had no chest pressure, neck or arm discomfort.   Past Medical History:  Diagnosis Date  . Diabetes mellitus without complication ([place])   . Hypertension   . NICM (nonischemic cardiomyopathy) ([place]) 10/29/2017   EF 20% by echo, minimal CAD at cath    Past Surgical History:  Procedure Laterality Date  . LEFT HEART CATH AND CORONARY ANGIOGRAPHY N/A 10/29/2017   Procedure: LEFT HEART CATH AND CORONARY ANGIOGRAPHY;  Surgeon: [name], MD;  Location: [facility];  Service:  Cardiovascular;  Laterality: N/A;     Current Outpatient Medications  Medication Sig Dispense Refill  . albuterol (PROVENTIL HFA;VENTOLIN HFA) 108 (90 Base) MCG/ACT inhaler Inhale 1-2 puffs into the lungs every 6 (six) hours as needed for wheezing or shortness of breath. 1 Inhaler 2  . atorvastatin (LIPITOR) 20 MG tablet Take 1 tablet (20 mg total) by mouth daily. 90 tablet 3  . Blood Glucose Monitoring Suppl (TRUE METRIX AIR GLUCOSE METER) w/Device KIT 1 kit by Does not apply route 2 (two) times daily. 1 kit 0  . glucose blood (TRUE METRIX BLOOD GLUCOSE TEST) test strip Use as instructed to check blood sugars twice daily and as needed 100 each 3  . lisinopril (PRINIVIL,ZESTRIL) 10 MG tablet Take 1 tablet (10 mg total) by mouth daily. 90 tablet 3  . metFORMIN (GLUCOPHAGE) 1000 MG tablet Take 1 tablet (1,000 mg total) by mouth every evening. 30 tablet 2  . metoprolol succinate (TOPROL-XL) 200 MG 24 hr tablet Take 1 tablet (200 mg total) by mouth daily. Take with or immediately following a meal. 90 tablet 3  . pantoprazole (PROTONIX) 20 MG tablet TAKE 2 TABLETS BY MOUTH DAILY. 60 tablet 1  . TRUEPLUS LANCETS 28G MISC Use  twice daily to check blood sugars and as needed 100 each 3   No current facility-administered medications for this visit.     Allergies:   Patient has no known allergies.    ROS:  Please see the history of present illness.   Otherwise, review of systems are positive for none.   All other systems are reviewed and negative.    PHYSICAL EXAM: VS:  BP (!) 152/90   Pulse (!) 109   Ht _0  (1.727 m)   Wt 255 lb 3.2 oz (115.8 kg)   BMI 38.80 kg/m  , BMI Body mass index is 38.8 kg/m. GENERAL:  Well appearing NECK:  No jugular venous distention, waveform within normal limits, carotid upstroke brisk and symmetric, no bruits, no thyromegaly LUNGS:  Clear to auscultation bilaterally CHEST:  Unremarkable HEART:  PMI not displaced or sustained,S1 and S2 within normal limits, no  S3, no S4, no clicks, no rubs, no murmurs ABD:  Flat, positive bowel sounds normal in frequency in pitch, no bruits, no rebound, no guarding, no midline pulsatile mass, no hepatomegaly, no splenomegaly EXT:  2 plus pulses throughout, no edema, no cyanosis no clubbing   EKG:  EKG is  ordered today. Sinus tachycardia, rate 109, axis within normal limits, intervals within normal limits, no acute ST-T wave changes.   Recent Labs: 10/14/2017: TSH 1.600 11/09/2017: BUN 15; Creatinine, Ser 1.06; Hemoglobin 14.0; Magnesium 2.0; Platelets 245; Potassium 3.6; Sodium 135 11/24/2017: ALT 26    Lipid Panel    Component Value Date/Time   CHOL 214 (H) 11/24/2017 1144   TRIG 150 (H) 11/24/2017 1144   HDL 29 (L) 11/24/2017 1144   CHOLHDL 7.4 (H) 11/24/2017 1144   LDLCALC 155 (H) 11/24/2017 1144      Wt Readings from Last 3 Encounters:  02/13/18 255 lb 3.2 oz (115.8 kg)  01/26/18 252 lb 12.8 oz (114.7 kg)  11/24/17 251 lb 3.2 oz (113.9 kg)      Other studies Reviewed: Additional studies/ records that were reviewed today include: None Review of the above records demonstrates:   ASSESSMENT AND PLAN:   CARDIOMYOPATHY:    He is a little bit tachycardic.  I am going to increase his metoprolol 150 mg for a few days and then he will go up to 200 mg.  Otherwise no change in therapy.  He will let me know if his heart rate starts coming down.  TACHYCARDIA:     This is being managed in the context of treating his CHF   Current medicines are reviewed at length with the patient today.  The patient does not have concerns regarding medicines.  The following changes have been made:  As above  Labs/ tests ordered today include:   None   Orders Placed This Encounter  Procedures  . EKG 12-Lead     Disposition:   FU with [name], [name] in one month.     Signed, [name], MD  02/13/2018 4:46 PM    [facility]

## 2018-02-13 NOTE — Telephone Encounter (Signed)
New message   STAT if HR is under 50 or over 120 (normal HR is 60-100 beats per minute)  1) What is your heart rate? 122 hr when he woke up now is 105   2) Do you have a log of your heart rate readings (document readings)?no   3) Do you have any other symptoms? No

## 2018-02-24 ENCOUNTER — Ambulatory Visit: Payer: BLUE CROSS/BLUE SHIELD | Admitting: Physician Assistant

## 2018-03-06 ENCOUNTER — Other Ambulatory Visit: Payer: Self-pay | Admitting: Family Medicine

## 2018-03-12 ENCOUNTER — Other Ambulatory Visit: Payer: Self-pay | Admitting: Physician Assistant

## 2018-03-25 ENCOUNTER — Ambulatory Visit: Payer: Self-pay | Admitting: Family Medicine

## 2018-03-31 ENCOUNTER — Ambulatory Visit (INDEPENDENT_AMBULATORY_CARE_PROVIDER_SITE_OTHER): Payer: BLUE CROSS/BLUE SHIELD | Admitting: Physician Assistant

## 2018-03-31 ENCOUNTER — Ambulatory Visit: Payer: BLUE CROSS/BLUE SHIELD | Admitting: Physician Assistant

## 2018-03-31 ENCOUNTER — Encounter: Payer: Self-pay | Admitting: Physician Assistant

## 2018-03-31 ENCOUNTER — Other Ambulatory Visit: Payer: Self-pay

## 2018-03-31 VITALS — BP 156/104 | HR 73 | Ht 68.0 in | Wt 260.4 lb

## 2018-03-31 DIAGNOSIS — I10 Essential (primary) hypertension: Secondary | ICD-10-CM | POA: Diagnosis not present

## 2018-03-31 DIAGNOSIS — I5042 Chronic combined systolic (congestive) and diastolic (congestive) heart failure: Secondary | ICD-10-CM

## 2018-03-31 MED ORDER — SACUBITRIL-VALSARTAN 24-26 MG PO TABS: 1.0000 | ORAL_TABLET | Freq: Two times a day (BID) | ORAL | 1 refills | Status: DC

## 2018-03-31 NOTE — Patient Instructions (Signed)
Medication Instructions:  STOP Lisinopril   START Entresto 24-26 mg twice daily.  --If you cannot tolerate Entresto, you may go back to Lisinopril. DO NOT take Entresto and Lisinopril at the same time. Please call if you stop taking the Entresto. [phone]  If you need a refill on your cardiac medications before your next appointment, please call your pharmacy.   Follow-Up: At [facility], you and your health needs are our priority.  As part of our continuing mission to provide you with exceptional heart care, we have created designated Provider Care Teams.  These Care Teams include your primary Cardiologist (physician) and Advanced Practice Providers (APPs -  Physician Assistants and Nurse Practitioners) who all work together to provide you with the care you need, when you need it. You will need a follow up appointment 1st available with Dr. Percival Spanish or one of the following Advanced Practice Providers on your designated Care Team:   [name], PA-C . Jory Sims, DNP, ANP  Any Other Special Instructions Will Be Listed Below (If Applicable). None

## 2018-03-31 NOTE — Progress Notes (Signed)
Cardiology Office Note   Date:  03/31/2018   ID:  Bryan Greet., DOB 02/21/1965, MRN [id number]  PCP:  [name], MD Cardiologist:  [name], MD 02/13/2018 Bryan Ferries, PA-C 11/12/2017  No chief complaint on file.   History of Present Illness: Bryan Bethel. is a 53 y.o. male with a history of NICM w/ EF 20% by echo, CP, cath 10/29/2017 w/ minimal CAD, DM, HTN, probs w/ hypotension from meds  01/30 office visit for tachycardia, ST on ECG, metoprolol increased, wt [place]. presents for cardiology follow up.   The tachycardia improved.   His BP is high today, has not had rx yet. He checks it at home, BP is better at home.  He has been compliant with the increased dose of metoprolol and is tolerating it well.  His blood pressure is better since he is on the higher dose of beta-blocker.  He has been weighing daily. Has not been exercising, feels the weight gain is weight, not water. Family stress has been limiting him. They have had a lot going on.   Has been breathing well. Not waking w/ LE edema, no orthopnea or PND.  A1c has been good. Has been less than 6.    The family stress has improved and he feels he will be able to start increasing his activity, especially since the weather is getting a little warmer.  He remembers being told about Entresto, and Dr. Percival Spanish was going to put him on it at the next office visit.  He wonders if he needs a 90-day prescription for it.  He wants to know if he can go back to the gym.   Past Medical History:  Diagnosis Date  . Diabetes mellitus without complication ([place])   . Hypertension   . NICM (nonischemic cardiomyopathy) ([place]) 10/29/2017   EF 20% by echo, minimal CAD at cath    Past Surgical History:  Procedure Laterality Date  . LEFT HEART CATH AND CORONARY ANGIOGRAPHY N/A 10/29/2017   Procedure: LEFT HEART CATH AND CORONARY ANGIOGRAPHY;  Surgeon: [name],  MD;  Location: [facility];  Service: Cardiovascular;  Laterality: N/A;    Current Outpatient Medications  Medication Sig Dispense Refill  . albuterol (PROVENTIL HFA;VENTOLIN HFA) 108 (90 Base) MCG/ACT inhaler Inhale 1-2 puffs into the lungs every 6 (six) hours as needed for wheezing or shortness of breath. 1 Inhaler 2  . atorvastatin (LIPITOR) 20 MG tablet Take 1 tablet (20 mg total) by mouth daily. 90 tablet 3  . Blood Glucose Monitoring Suppl (TRUE METRIX AIR GLUCOSE METER) w/Device KIT 1 kit by Does not apply route 2 (two) times daily. 1 kit 0  . glucose blood (TRUE METRIX BLOOD GLUCOSE TEST) test strip Use as instructed to check blood sugars twice daily and as needed 100 each 3  . lisinopril (PRINIVIL,ZESTRIL) 10 MG tablet Take 1 tablet (10 mg total) by mouth daily. 90 tablet 3  . metFORMIN (GLUCOPHAGE) 1000 MG tablet TAKE 1 TABLET BY MOUTH EVERY EVENING. 30 tablet 2  . metoprolol succinate (TOPROL-XL) 200 MG 24 hr tablet Take 1 tablet (200 mg total) by mouth daily. Take with or immediately following a meal. 90 tablet 3  . pantoprazole (PROTONIX) 20 MG tablet TAKE 2 TABLETS BY MOUTH DAILY. 60 tablet 1  . TRUEPLUS LANCETS 28G MISC Use twice daily to check blood sugars and as needed 100 each 3   No current facility-administered medications  for this visit.     Allergies:   Patient has no known allergies.    Social History:  The patient  reports that he has never smoked. He has never used smokeless tobacco. He reports previous alcohol use. He reports that he does not use drugs.   Family History:  The patient's He was adopted. Family history is unknown by patient.  is adopted.     ROS:  Please see the history of present illness. All other systems are reviewed and negative.    PHYSICAL EXAM: VS:  BP (!) 156/104   Pulse 73   Ht '5\' 8"'$  (1.727 m)   Wt 260 lb 6.4 oz (118.1 kg)   BMI 39.59 kg/m  , BMI Body mass index is 39.59 kg/m. GEN: Well nourished, well developed, male in  no acute distress HEENT: normal for age  Neck: no JVD, no carotid bruit, no masses Cardiac: RRR; no murmur, no rubs, or gallops Respiratory:  clear to auscultation bilaterally, normal work of breathing GI: soft, nontender, nondistended, + BS MS: no deformity or atrophy; no edema; distal pulses are 2+ in all 4 extremities  Skin: warm and dry, no rash Neuro:  Strength and sensation are intact Psych: euthymic mood, full affect   EKG:  EKG is not ordered today.  ECHO: 01/2018 - Left ventricle: The cavity size was severely dilated. Systolic   function was severely reduced. The estimated ejection fraction   was in the range of 25% to 30%. Severe diffuse hypokinesis. The   study is not technically sufficient to allow evaluation of LV   diastolic function. - Mitral valve: There was trivial regurgitation. - Left atrium: The atrium was severely dilated. - Tricuspid valve: There was trivial regurgitation.  CATH: 10/29/2017  Prox RCA lesion is 30% stenosed.   No significant coronary obstructive disease with smooth 25 -30% proximal RCA narrowing and a normal left coronary circulation.  LVEDP 17 mmHg.  Previous echo Doppler documented EF of 20 to 25%.  RECOMMENDATION: Guideline directed medical therapy for  nonischemic cardiomyopathy.  The patient will follow-up with Dr. Percival Spanish for further evaluation and treatment. Recommend Aspirin '81mg'$  daily for moderate CAD.   Recent Labs: 10/14/2017: TSH 1.600 11/09/2017: BUN 15; Creatinine, Ser 1.06; Hemoglobin 14.0; Magnesium 2.0; Platelets 245; Potassium 3.6; Sodium 135 11/24/2017: ALT 26  CBC    Component Value Date/Time   WBC 9.1 11/09/2017 0502   RBC 5.71 11/09/2017 0502   HGB 14.0 11/09/2017 0502   HCT 44.3 11/09/2017 0502   PLT 245 11/09/2017 0502   MCV 77.6 (L) 11/09/2017 0502   MCH 24.5 (L) 11/09/2017 0502   MCHC 31.6 11/09/2017 0502   RDW 13.0 11/09/2017 0502   LYMPHSABS 2.6 11/09/2017 0502   MONOABS 0.7 11/09/2017 0502    EOSABS 0.2 11/09/2017 0502   BASOSABS 0.0 11/09/2017 0502   CMP Latest Ref Rng & Units 11/24/2017 11/09/2017 10/20/2017  Glucose 70 - 99 mg/dL - 163(H) 148(H)  BUN 6 - 20 mg/dL - 15 15  Creatinine 0.61 - 1.24 mg/dL - 1.06 0.99  Sodium 135 - 145 mmol/L - 135 138  Potassium 3.5 - 5.1 mmol/L - 3.6 3.5  Chloride 98 - 111 mmol/L - 101 102  CO2 22 - 32 mmol/L - 20(L) 24  Calcium 8.9 - 10.3 mg/dL - 9.2 9.3  Total Protein 6.0 - 8.5 g/dL 7.5 - -  Total Bilirubin 0.0 - 1.2 mg/dL 0.3 - -  Alkaline Phos 39 - 117 IU/L 85 - -  AST 0 - 40 IU/L 22 - -  ALT 0 - 44 IU/L 26 - -     Lipid Panel Lab Results  Component Value Date   CHOL 214 (H) 11/24/2017   HDL 29 (L) 11/24/2017   LDLCALC 155 (H) 11/24/2017   TRIG 150 (H) 11/24/2017   CHOLHDL 7.4 (H) 11/24/2017      Wt Readings from Last 3 Encounters:  03/31/18 260 lb 6.4 oz (118.1 kg)  02/13/18 255 lb 3.2 oz (115.8 kg)  01/26/18 252 lb 12.8 oz (114.7 kg)     Other studies Reviewed: Additional studies/ records that were reviewed today include: Office notes, hospital records and testing.  ASSESSMENT AND PLAN:  1.  Chronic combined systolic and diastolic CHF: -His EF was rechecked in January and is still very low at 25-30%. - Although he has gained some weight, no significant volume overload by exam and the patient also feels that he has gained weight and not fluid. - It is okay for him to go back to the gym, but I emphasized strongly that he needs to keep his activity level at a 5/10 or less.  He is not to overexert himself.  He understands. - I will go ahead and change him from lisinopril to [facility].  He understands that he has to stop the lisinopril for 2 days before he starts the [place]. -Start at the lowest dose and have him follow-up for titration and lab work.  Renal function and potassium have been normal on the ACE inhibitor so will not recheck them today  2.  Hypertension: -His blood pressure has generally been good on the  higher dose of beta-blocker. - He says he takes his blood pressure every morning when he first wakes and says it is never this high. -He has not had morning medications yet. - Encourage compliance with daily meds, since he is being started on Entresto, no other med changes right now    Current medicines are reviewed at length with the patient today.  The patient has concerns regarding medicines.  Concerns were addressed  The following changes have been made: Continue lisinopril, add Entresto  Labs/ tests ordered today include:  No orders of the defined types were placed in this encounter.    Disposition:   FU with [name], MD  Signed, Bryan Ferries, PA-C  03/31/2018 12:00 PM    [place] Phone: [phone]; Fax: [phone]

## 2018-04-08 ENCOUNTER — Ambulatory Visit: Payer: Self-pay | Admitting: Family Medicine

## 2018-04-13 ENCOUNTER — Telehealth (INDEPENDENT_AMBULATORY_CARE_PROVIDER_SITE_OTHER): Payer: BLUE CROSS/BLUE SHIELD | Admitting: Cardiology

## 2018-04-13 ENCOUNTER — Encounter: Payer: Self-pay | Admitting: Cardiology

## 2018-04-13 ENCOUNTER — Telehealth: Payer: Self-pay | Admitting: Cardiology

## 2018-04-13 VITALS — BP 167/93 | HR 79 | Ht 68.0 in | Wt 257.0 lb

## 2018-04-13 DIAGNOSIS — I5042 Chronic combined systolic (congestive) and diastolic (congestive) heart failure: Secondary | ICD-10-CM

## 2018-04-13 MED ORDER — SACUBITRIL-VALSARTAN 49-51 MG PO TABS: 1.0000 | ORAL_TABLET | Freq: Two times a day (BID) | ORAL | 11 refills | Status: DC

## 2018-04-13 NOTE — Addendum Note (Signed)
Addended by: Vennie Homans on: 04/13/2018 01:50 PM   Modules accepted: Orders

## 2018-04-13 NOTE — Patient Instructions (Signed)
Medication Instructions:  INCREASE- Entresto 49/51 mg twice a day  If you need a refill on your cardiac medications before your next appointment, please call your pharmacy.  Labwork: BMP in 2 week HERE IN OUR OFFICE AT [facility]    You will NOT need to fast   Take the provided lab slips with you to the lab for your blood draw.   When you have your labs (blood work) drawn today and your tests are completely normal, you will receive your results only by MyChart Message (if you have MyChart) -OR-  A paper copy in the mail.  If you have any lab test that is abnormal or we need to change your treatment, we will call you to review these results.  Testing/Procedures: None Ordered   Follow-Up: . Your physician recommends that you schedule a follow-up appointment in: 1 Month video visit   At [facility], you and your health needs are our priority.  As part of our continuing mission to provide you with exceptional heart care, we have created designated Provider Care Teams.  These Care Teams include your primary Cardiologist (physician) and Advanced Practice Providers (APPs -  Physician Assistants and Nurse Practitioners) who all work together to provide you with the care you need, when you need it.  Thank you for choosing [facility] at [facility]!!

## 2018-04-13 NOTE — Telephone Encounter (Signed)
New Message   Pt c/o medication issue:  1. Name of Medication: sacubitril-valsartan (ENTRESTO) 49-51 MG    2. How are you currently taking this medication (dosage and times per day)?   3. Are you having a reaction (difficulty breathing--STAT)?  4. What is your medication issue? [facility] is calling to get clari

## 2018-04-13 NOTE — Telephone Encounter (Signed)
Disregard

## 2018-04-13 NOTE — Progress Notes (Signed)
Virtual Visit via Video Note    Evaluation Performed:  Follow-up visit  This visit type was conducted due to national recommendations for restrictions regarding the COVID-19 Pandemic (e.g. social distancing).  This format is felt to be most appropriate for this patient at this time.  All issues noted in this document were discussed and addressed.  No physical exam was performed (except for noted visual exam findings with Video Visits).  Please refer to the patient's chart (MyChart message for video visits and phone note for telephone visits) for the patient's consent to telehealth for [facility].  Date:  04/13/2018   ID:  [name], DOB August 07, 1965, MRN [id number]  Patient Location:  Home address  Provider location:   [place]  PCP:  [name], MD  Cardiologist:  [name], MD  Electrophysiologist:  None   Chief Complaint:  SOB  History of Present Illness:    Shalin Vonbargen. is a 53 y.o. male who presents via audio/video conferencing for a telehealth visit today.    The patient denies any new symptoms such as chest discomfort, neck or arm discomfort. There has been no new shortness of breath, PND or orthopnea. There have been no reported palpitations, presyncope or syncope.  He was started on Entresto at a recent visit and he did well with this.  BP is as below.     The patient does not symptoms concerning for COVID-19 infection (fever, chills, cough, or new shortness of breath).    Prior CV studies:   The following studies were reviewed today:  None  Past Medical History:  Diagnosis Date  . Diabetes mellitus without complication ([place])   . Hypertension   . NICM (nonischemic cardiomyopathy) ([place]) 10/29/2017   EF 20% by echo, minimal CAD at cath   Past Surgical History:  Procedure Laterality Date  . LEFT HEART CATH AND CORONARY ANGIOGRAPHY N/A 10/29/2017   Procedure: LEFT HEART CATH AND CORONARY ANGIOGRAPHY;  Surgeon: [name], MD;  Location: [facility];  Service: Cardiovascular;  Laterality: N/A;     Current Meds  Medication Sig  . albuterol (PROVENTIL HFA;VENTOLIN HFA) 108 (90 Base) MCG/ACT inhaler Inhale 1-2 puffs into the lungs every 6 (six) hours as needed for wheezing or shortness of breath.  . Blood Glucose Monitoring Suppl (TRUE METRIX AIR GLUCOSE METER) w/Device KIT 1 kit by Does not apply route 2 (two) times daily.  Marland Kitchen glucose blood (TRUE METRIX BLOOD GLUCOSE TEST) test strip Use as instructed to check blood sugars twice daily and as needed  . metFORMIN (GLUCOPHAGE) 1000 MG tablet TAKE 1 TABLET BY MOUTH EVERY EVENING.  . metoprolol succinate (TOPROL-XL) 200 MG 24 hr tablet Take 1 tablet (200 mg total) by mouth daily. Take with or immediately following a meal.  . pantoprazole (PROTONIX) 20 MG tablet TAKE 2 TABLETS BY MOUTH DAILY.  . sacubitril-valsartan (ENTRESTO) 24-26 MG Take 1 tablet by mouth 2 (two) times daily.  . TRUEPLUS LANCETS 28G MISC Use twice daily to check blood sugars and as needed     Allergies:   Patient has no known allergies.   Social History   Tobacco Use  . Smoking status: Never Smoker  . Smokeless tobacco: Never Used  Substance Use Topics  . Alcohol use: Not Currently  . Drug use: Never     Family Hx: The patient's He was adopted. Family history is unknown by patient.  ROS:   Please see the history of present illness.  As stated in the HPI and negative for all other systems.    Labs/Other Tests and Data Reviewed:    Recent Labs: 10/14/2017: TSH 1.600 11/09/2017: BUN 15; Creatinine, Ser 1.06; Hemoglobin 14.0; Magnesium 2.0; Platelets 245; Potassium 3.6; Sodium 135 11/24/2017: ALT 26   Recent Lipid Panel Lab Results  Component Value Date/Time   CHOL 214 (H) 11/24/2017 11:44 AM   TRIG 150 (H) 11/24/2017 11:44 AM   HDL 29 (L) 11/24/2017 11:44 AM   CHOLHDL 7.4 (H) 11/24/2017 11:44 AM   LDLCALC 155 (H) 11/24/2017 11:44 AM    Wt Readings from Last  3 Encounters:  04/13/18 257 lb (116.6 kg)  03/31/18 260 lb 6.4 oz (118.1 kg)  02/13/18 255 lb 3.2 oz (115.8 kg)     Exam:    Vital Signs:  BP (!) 167/93   Pulse 79   Ht '5\' 8"'$  (1.727 m)   Wt 257 lb (116.6 kg)   BMI 39.08 kg/m    Well nourished, well developed male in no distress and no leg edema evident on video  ASSESSMENT & PLAN:    CHRONIC COMBINED SYSTOLIC AND DIASTOLIC HF:      HTN:  This is being managed in the context of treating his CHF .  He will double his current Entresto.  We will call in a new prescription for the 49/54 mg bid dose.  I would like to have a follow up appt via video in one month.  He will need to have labs drawn in 2 weeks.   COVID-19 Education: The signs and symptoms of COVID-19 were discussed with the patient and how to seek care for testing (follow up with PCP or arrange E-visit).  The importance of social distancing was discussed today.  Patient Risk:   After full review of this patients clinical status, I feel that they are at least moderate risk at this time.  Time:   Today, I have spent 25 minutes with the patient with telehealth technology discussing meds and other issues as above. .     Medication Adjustments/Labs and Tests Ordered: Current medicines are reviewed at length with the patient today.  Concerns regarding medicines are outlined above.  Tests Ordered: No orders of the defined types were placed in this encounter.  Medication Changes: No orders of the defined types were placed in this encounter.   Disposition:  Follow up in 1 month(s)  Signed, [name], MD  04/13/2018 11:50 AM    [place]

## 2018-04-17 ENCOUNTER — Ambulatory Visit: Payer: BLUE CROSS/BLUE SHIELD | Admitting: Cardiology

## 2018-04-29 ENCOUNTER — Telehealth: Payer: Self-pay | Admitting: Cardiology

## 2018-04-29 NOTE — Telephone Encounter (Signed)
Pt called to report that Dr. Percival Spanish had talked with him about increasing his Delene Loll after he has labs drawn that he is coming in to have done 04/30/18.... he will be out of his current dose after this Friday..I advised him to come early for his labs so we can hopefully get them back before the end of the day and review with Dr. Percival Spanish since he will be here in the office... pt agreed.

## 2018-04-29 NOTE — Telephone Encounter (Signed)
New Message   Pt c/o medication issue:  1. Name of Medication: Sacubitril-Valsartan 49-51mg   2. How are you currently taking this medication (dosage and times per day)? 49-51mg  1 tablet twice a day   3. Are you having a reaction (difficulty breathing--STAT)? NO  4. What is your medication issue? Patient needs a refill but states Dr. Lolly Mustache was going to increase the med and wants a nurse to call him back.  He will be coming in tomorrow for labs as well.

## 2018-04-30 NOTE — Telephone Encounter (Signed)
Leave message for pt to call back 

## 2018-04-30 NOTE — Telephone Encounter (Signed)
Spoke with pt to let him know Dr Percival Spanish will result on blood work and let him know if any changes need to be made with his medications

## 2018-05-01 ENCOUNTER — Telehealth: Payer: Self-pay | Admitting: Cardiology

## 2018-05-01 LAB — BASIC METABOLIC PANEL
BUN/Creatinine Ratio: 15 (ref 9–20)
BUN: 16 mg/dL (ref 6–24)
CO2: 22 mmol/L (ref 20–29)
Calcium: 9.7 mg/dL (ref 8.7–10.2)
Chloride: 103 mmol/L (ref 96–106)
Creatinine, Ser: 1.08 mg/dL (ref 0.76–1.27)
GFR calc Af Amer: 90 mL/min/{1.73_m2} (ref >59)
GFR calc non Af Amer: 78 mL/min/{1.73_m2} (ref >59)
Glucose: 124 mg/dL — ABNORMAL HIGH (ref 65–99)
Potassium: 4.8 mmol/L (ref 3.5–5.2)
Sodium: 143 mmol/L (ref 134–144)

## 2018-05-01 MED ORDER — SACUBITRIL-VALSARTAN 49-51 MG PO TABS: 1.0000 | ORAL_TABLET | Freq: Two times a day (BID) | ORAL | 6 refills | Status: DC

## 2018-05-01 NOTE — Telephone Encounter (Signed)
Spoke to patient [facility] prescription sent to [facility].

## 2018-05-01 NOTE — Telephone Encounter (Signed)
Follow up    Pt is returning call to follow up  He would like the written prescription before closing

## 2018-05-01 NOTE — Telephone Encounter (Signed)
Malachy Mood can you please print this since you are onsite?  Thank you!

## 2018-05-01 NOTE — Telephone Encounter (Signed)
New Message:    Pt wants a written prescription for Entresto. He would like to pick this up today  If possible please.

## 2018-05-01 NOTE — Telephone Encounter (Signed)
Follow Up:    Pt wants to know if he can head tat way to pick up his prescription please?

## 2018-05-01 NOTE — Telephone Encounter (Signed)
Spoke to patient he stated he is going to call a couple of pharmacies to get the best price for enrtresto.Advised I will call him back.

## 2018-05-01 NOTE — Telephone Encounter (Signed)
New Message    *STAT* If patient is at the pharmacy, call can be transferred to refill team.   1. Which medications need to be refilled? (please list name of each medication and dose if known) Sacubitril-Valsartan 49-51mg    2. Which pharmacy/location (including street and city if local pharmacy) is [facility] [address] [phone]  3. Do they need a 30 day or 90 day supply?  30 day supply  Patient out of meds needs refill as soon as possible, also please give him a call.

## 2018-05-11 ENCOUNTER — Other Ambulatory Visit: Payer: Self-pay | Admitting: Family Medicine

## 2018-06-12 ENCOUNTER — Other Ambulatory Visit: Payer: Self-pay

## 2018-06-12 ENCOUNTER — Ambulatory Visit: Payer: Self-pay | Admitting: Family Medicine

## 2018-06-12 ENCOUNTER — Ambulatory Visit: Payer: BLUE CROSS/BLUE SHIELD | Attending: Family Medicine | Admitting: Primary Care

## 2018-06-12 ENCOUNTER — Ambulatory Visit: Payer: BLUE CROSS/BLUE SHIELD

## 2018-06-12 ENCOUNTER — Telehealth: Payer: Self-pay | Admitting: Cardiology

## 2018-06-12 ENCOUNTER — Encounter: Payer: Self-pay | Admitting: Primary Care

## 2018-06-12 DIAGNOSIS — I10 Essential (primary) hypertension: Secondary | ICD-10-CM

## 2018-06-12 DIAGNOSIS — K5900 Constipation, unspecified: Secondary | ICD-10-CM

## 2018-06-12 DIAGNOSIS — E1165 Type 2 diabetes mellitus with hyperglycemia: Secondary | ICD-10-CM

## 2018-06-12 DIAGNOSIS — Z6838 Body mass index (BMI) 38.0-38.9, adult: Secondary | ICD-10-CM

## 2018-06-12 NOTE — Telephone Encounter (Signed)
Patient called, was returned Nya's call from 04/16- I advised that the only thing was lab work, and reviewed lab work from [employer] as well. Patient had no questions or concerns at this time.

## 2018-06-12 NOTE — Progress Notes (Signed)
Heart Failure and DM and want to see his progress. Per pt he's needing to know his A1c for his DM and per pt he just had labs from the Cardiologist.   Per pt his blood sugar this morning before eating was 96.

## 2018-06-12 NOTE — Telephone Encounter (Signed)
New Message           Patient is returning "Winnie"s call, would like a call back

## 2018-06-12 NOTE — Progress Notes (Signed)
Virtual Visit via Telephone Note  I connected with Bryan English. on 06/12/18 at 10:07 AM EDT by telephone and verified that I am speaking with the correct person using two identifiers.   I discussed the limitations, risks, security and privacy concerns of performing an evaluation and management service by telephone and the availability of in person appointments. I also discussed with the patient that there may be a patient responsible charge related to this service. The patient expressed understanding and agreed to proceed.   History of Present Illness: [name] is being seen today inquiring on his A1C and labs. Placed orders in chart . He had no other conerns.   Observations/Objective: Review of Systems  Constitutional: Negative.   HENT: Negative.   Respiratory: Positive for shortness of breath.        With exertion   Cardiovascular: Negative.   Gastrointestinal: Positive for constipation.  Genitourinary: Negative.   Musculoskeletal: Negative.   Skin: Negative.   Neurological: Positive for headaches.  Endo/Heme/Allergies: Negative.   Psychiatric/Behavioral: The patient has insomnia.        5-6 hours sleep at night . Sometimes has trouble going to sleep but not the norm    Assessment and Plan: Montrelle was seen today for diabetes.  Diagnoses and all orders for this visit:  Essential hypertension Unable to provide me with a blood pressure reading.  Followed by Dr. Percival Spanish cardiology for combined systolic and diastolic heart failure currently taking Entresto Bp remained elevated and double curent  Dose 04/13/2018   Type 2 diabetes mellitus with hyperglycemia, without long-term current use of insulin (HCC) Currently his diabetes medication includes metformin daily thousand milligrams.  Place order for A1c.  Class 2 severe obesity with serious comorbidity and body mass index (BMI) of 38.0 to 38.9 in adult, unspecified obesity type ([place]) Discussed diet and exercise  will also improve diabetes and hypertension.  Due to the pandemic gyms are closed he is walking for exercise.  Constipation, unspecified constipation type He complains of unable to have bowel movements advised to increase fiber in diet and may take OTC sennaS for a laxative and stool softener.  Also admits to not drinking a lot of water.  Encourage 64 ounces daily    Follow Up Instructions:    I discussed the assessment and treatment plan with the patient. The patient was provided an opportunity to ask questions and all were answered. The patient agreed with the plan and demonstrated an understanding of the instructions.   The patient was advised to call back or seek an in-person evaluation if the symptoms worsen or if the condition fails to improve as anticipated.  I provided 16 minutes of non-face-to-face time during this encounter.   Kerin Perna, NP

## 2018-06-13 LAB — LIPID PANEL
Chol/HDL Ratio: 7 ratio — ABNORMAL HIGH (ref 0.0–5.0)
Cholesterol, Total: 218 mg/dL — ABNORMAL HIGH (ref 100–199)
HDL: 31 mg/dL — ABNORMAL LOW (ref >39)
LDL Calculated: 153 mg/dL — ABNORMAL HIGH (ref 0–99)
Triglycerides: 171 mg/dL — ABNORMAL HIGH (ref 0–149)
VLDL Cholesterol Cal: 34 mg/dL (ref 5–40)

## 2018-06-13 LAB — CBC WITH DIFFERENTIAL/PLATELET
Basophils Absolute: 0 10*3/uL (ref 0.0–0.2)
Basos: 0 %
EOS (ABSOLUTE): 0.2 10*3/uL (ref 0.0–0.4)
Eos: 2 %
Hematocrit: 43.3 % (ref 37.5–51.0)
Hemoglobin: 14 g/dL (ref 13.0–17.7)
Immature Grans (Abs): 0 10*3/uL (ref 0.0–0.1)
Immature Granulocytes: 0 %
Lymphocytes Absolute: 3.3 10*3/uL — ABNORMAL HIGH (ref 0.7–3.1)
Lymphs: 34 %
MCH: 25.7 pg — ABNORMAL LOW (ref 26.6–33.0)
MCHC: 32.3 g/dL (ref 31.5–35.7)
MCV: 80 fL (ref 79–97)
Monocytes Absolute: 0.7 10*3/uL (ref 0.1–0.9)
Monocytes: 7 %
Neutrophils Absolute: 5.6 10*3/uL (ref 1.4–7.0)
Neutrophils: 57 %
Platelets: 280 10*3/uL (ref 150–450)
RBC: 5.44 x10E6/uL (ref 4.14–5.80)
RDW: 14.1 % (ref 11.6–15.4)
WBC: 10 10*3/uL (ref 3.4–10.8)

## 2018-06-13 LAB — HEMOGLOBIN A1C
Est. average glucose Bld gHb Est-mCnc: 148 mg/dL
Hgb A1c MFr Bld: 6.8 % — ABNORMAL HIGH (ref 4.8–5.6)

## 2018-06-14 ENCOUNTER — Other Ambulatory Visit: Payer: Self-pay | Admitting: Family Medicine

## 2018-06-14 DIAGNOSIS — E785 Hyperlipidemia, unspecified: Secondary | ICD-10-CM

## 2018-06-14 DIAGNOSIS — Z79899 Other long term (current) drug therapy: Secondary | ICD-10-CM

## 2018-06-14 DIAGNOSIS — E1169 Type 2 diabetes mellitus with other specified complication: Secondary | ICD-10-CM

## 2018-06-14 MED ORDER — ROSUVASTATIN CALCIUM 10 MG PO TABS: 10.0000 mg | ORAL_TABLET | Freq: Every day | ORAL | 11 refills | Status: DC

## 2018-06-14 NOTE — Progress Notes (Signed)
Patient ID: Bryan Greet., male   DOB: 1965/11/08, 52 y.o.   MRN: [id number]   Patient with recent lipid panel showing continued dyslipidemia with elevated LDL and triglycerides and low HDL.  Prescription will be sent to patient's pharmacy for generic Crestor 10 mg to take once daily and patient will also be notified to have lab visit for LFTs in about 6 weeks after starting cholesterol medication.

## 2018-06-30 ENCOUNTER — Telehealth: Payer: Self-pay | Admitting: Cardiology

## 2018-06-30 NOTE — Telephone Encounter (Signed)
Patient's wife called stating Bryan English takes [employer] and insurance company advised them that we have to get prior-auth for it.  He insurance recently changed he now has [employer] [id number] [place] # [phone] BIN# [id number] [phone] phone number for providers.

## 2018-06-30 NOTE — Telephone Encounter (Signed)
New message   Pt c/o medication issue:  1. Name of Medication: sacubitril-valsartan (ENTRESTO) 49-51 MG  2. How are you currently taking this medication (dosage and times per day)? Take 1 tablet by mouth 2 (two) times daily.  3. Are you having a reaction (difficulty breathing--STAT)?no   4. What is your medication issue? Per Estill Bamberg at [employer] my Meds need prior authorization.

## 2018-06-30 NOTE — Telephone Encounter (Signed)
PA for entresto started through cover my meds

## 2018-06-30 NOTE — Telephone Encounter (Signed)
Spoke to  [employer] my med - representative - she states the key  [id number]  WAS  Cancelled due to patient not with blue cross and blue shield.  Representative states another Prior auth needs to be started.     ANOTHER PRIOR STARTED WITH PATIENT CURRENT INFORMATION  KEY [id number] EF 25-30% ON ECHO 01-19-18

## 2018-07-01 ENCOUNTER — Telehealth: Payer: Self-pay | Admitting: Cardiology

## 2018-07-01 ENCOUNTER — Other Ambulatory Visit: Payer: Self-pay

## 2018-07-01 ENCOUNTER — Telehealth: Payer: Self-pay

## 2018-07-01 NOTE — Telephone Encounter (Signed)
New Message     Pts wife says the medication ENTRESTO prior authorization should have been sent to our office    Please call

## 2018-07-01 NOTE — Telephone Encounter (Signed)
Opened in error

## 2018-07-01 NOTE — Telephone Encounter (Signed)
Called and spoke with wife- advised that PA was sent yesterday, looked on Covermymeds and seen it was still in determination. Advised that we could give samples- notified samples were here and could come pick up since he has been completely out.  Patient wife thankful for call.

## 2018-07-02 ENCOUNTER — Other Ambulatory Visit: Payer: Self-pay

## 2018-07-02 ENCOUNTER — Telehealth: Payer: Self-pay

## 2018-07-02 MED ORDER — ENTRESTO 49-51 MG PO TABS: 1.0000 | ORAL_TABLET | Freq: Two times a day (BID) | ORAL | 6 refills | Status: DC

## 2018-07-02 NOTE — Telephone Encounter (Signed)
Prior Auth was approved for Entresto (Sacubitril- Valsartan tab) 49-51mg  tab.

## 2018-07-08 NOTE — Telephone Encounter (Signed)
ENTRESTO  was approved   PATIENT AWAREWILL PCK UP MEDICATION

## 2018-07-22 ENCOUNTER — Telehealth: Payer: Self-pay | Admitting: Cardiology

## 2018-07-22 NOTE — Telephone Encounter (Signed)
It does not sound like this HR is significantly lower.  He should keep a BP diary for two weeks twice daily and submit these readings along with the HR.  No change in meds at this point.

## 2018-07-22 NOTE — Addendum Note (Signed)
Addended by: Alvina Filbert B on: 07/22/2018 10:02 AM   Modules accepted: Level of Service, SmartSet

## 2018-07-22 NOTE — Telephone Encounter (Signed)
Patient states for the past week he has noticied that his HR is running lower than normal for him. He is normally in the 60 or 70's but this past week he has been running 55-59 at rest. He states his BP has been norm 127/81, but he just checked it this morning with no medications and he had to check a few times to get a reading it was 170/91 HR was 62. He has no other symptoms other than feel a little lightheaded when he goes from resting to standing, denies palpitations, chest pain, sob, and swelling. Patient was advised at rest the HR can drop lower- normal is 60. Patient still requested to have message sent back for recommendations.   Please advise. Thank you!

## 2018-07-22 NOTE — Telephone Encounter (Signed)
Duplicate

## 2018-07-22 NOTE — Telephone Encounter (Signed)
New message:     Patient calling he is having allow HR running around 56 to about 60 just this week. Please call patient back.

## 2018-07-22 NOTE — Telephone Encounter (Signed)
This encounter was created in error - please disregard.

## 2018-07-22 NOTE — Telephone Encounter (Signed)
New message:    Patient calling concerning his HR running low this week and would like for some one to call concering this matter.

## 2018-07-23 NOTE — Telephone Encounter (Signed)
Left message to call back  

## 2018-07-23 NOTE — Telephone Encounter (Signed)
Returned call to patient. Explained [place] is off today.   Patient called yesterday about his HR He reports his resting HR has been in the 50s just this week (55-59 while lying in bed) Normal HR in 60s-70s Reports his HR will go up when he gets up and moves around He is dieting and exercising - cutting calories - lost about 10lbs in 1 month He reports lightheaded, but not like he will pass out He routinely checks VS BP varies from 001U-429I systolic Patient is on toprol 200mg  & entresto   Patient has an appt with MD on 7/13. Advised to monitor VS and record to provide to Dr. Percival Spanish during visit Will route to MD for review

## 2018-07-23 NOTE — Telephone Encounter (Signed)
No other suggestions

## 2018-07-23 NOTE — Telephone Encounter (Signed)
F/U Message           Patient is returning Melinda's call, would like a call back.

## 2018-07-23 NOTE — Telephone Encounter (Signed)
Patient called. Will monitor VS and report to MD during visit 07/27/18

## 2018-07-24 ENCOUNTER — Other Ambulatory Visit: Payer: Self-pay | Admitting: Family Medicine

## 2018-07-24 MED ORDER — PANTOPRAZOLE SODIUM 40 MG PO TBEC: 40.0000 mg | DELAYED_RELEASE_TABLET | Freq: Every day | ORAL | 2 refills | Status: DC

## 2018-07-26 NOTE — Progress Notes (Signed)
Cardiology Office Note   Date:  07/27/2018   ID:  Bryan English., DOB [birth year], MRN [id number]  PCP:  Bryan Blackbird, MD  Cardiologist:   [name], MD    Chief Complaint  Patient presents with  . Cardiomyopathy      History of Present Illness: Bryan English. is a 53 y.o. male who was referred by himself for evaluation of chest pain.    He had had multiple ED visits for chest pain. He was not found to have any evidence for ischemia.  In a previous visit in August he had had negative CT for PE.  He did have coronary calcification in the LAD.   When I saw him I sent him for an echo.  He was found to have an EF of 20%.  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   He has not been in the ED since late October.   His EF this month was slightly better with an EF of 25 - 30%.   He called this week and was concerned about bradycardia.    He feels well despite the low heart rate.  Has been breathing fine.  He has been exercising and running a little bit.  He denies any chest pressure, neck or arm discomfort.  He is not having any palpitations, presyncope or syncope.  He denies any PND or orthopnea.  The last appointment increase his Delene Loll was tolerated this.   Past Medical History:  Diagnosis Date  . Diabetes mellitus without complication ([place])   . Hypertension   . NICM (nonischemic cardiomyopathy) ([place]) 10/29/2017   EF 20% by echo, minimal CAD at cath    Past Surgical History:  Procedure Laterality Date  . LEFT HEART CATH AND CORONARY ANGIOGRAPHY N/A 10/29/2017   Procedure: LEFT HEART CATH AND CORONARY ANGIOGRAPHY;  Surgeon: Bryan Sine, MD;  Location: [facility];  Service: Cardiovascular;  Laterality: N/A;     Current Outpatient Medications  Medication Sig Dispense Refill  . albuterol (PROVENTIL HFA;VENTOLIN HFA) 108 (90 Base) MCG/ACT inhaler Inhale 1-2 puffs into the lungs every 6 (six) hours as needed for wheezing or shortness of  breath. 1 Inhaler 2  . Blood Glucose Monitoring Suppl (TRUE METRIX AIR GLUCOSE METER) w/Device KIT 1 kit by Does not apply route 2 (two) times daily. 1 kit 0  . glucose blood (TRUE METRIX BLOOD GLUCOSE TEST) test strip Use as instructed to check blood sugars twice daily and as needed 100 each 3  . metFORMIN (GLUCOPHAGE) 1000 MG tablet TAKE 1 TABLET BY MOUTH EVERY EVENING. 30 tablet 2  . metoprolol succinate (TOPROL-XL) 200 MG 24 hr tablet Take 1 tablet (200 mg total) by mouth daily. Take with or immediately following a meal. 90 tablet 3  . pantoprazole (PROTONIX) 40 MG tablet Take 1 tablet (40 mg total) by mouth daily. 30 tablet 2  . TRUEPLUS LANCETS 28G MISC Use twice daily to check blood sugars and as needed 100 each 3  . rosuvastatin (CRESTOR) 10 MG tablet Take 1 tablet (10 mg total) by mouth daily. To lower cholesterol (Patient not taking: Reported on 07/27/2018) 30 tablet 11  . sacubitril-valsartan (ENTRESTO) 97-103 MG Take 1 tablet by mouth 2 (two) times daily. 60 tablet 5   No current facility-administered medications for this visit.     Allergies:   Patient has no known allergies.    ROS:  Please see the history of present illness.  Otherwise, review of systems are positive for none.   All other systems are reviewed and negative.    PHYSICAL EXAM: VS:  BP (!) 168/118   Pulse 71   Ht '5\' 8"'$  (1.727 m)   Wt 253 lb 12.8 oz (115.1 kg)   BMI 38.59 kg/m  , BMI Body mass index is 38.59 kg/m. GENERAL:  Well appearing NECK:  No jugular venous distention, waveform within normal limits, carotid upstroke brisk and symmetric, no bruits, no thyromegaly LUNGS:  Clear to auscultation bilaterally CHEST:  Unremarkable HEART:  PMI not displaced or sustained,S1 and S2 within normal limits, no S3, no S4, no clicks, no rubs, 2 out of 6 apical systolic murmur, no diastolic murmurs ABD:  Flat, positive bowel sounds normal in frequency in pitch, no bruits, no rebound, no guarding, no midline pulsatile  mass, no hepatomegaly, no splenomegaly EXT:  2 plus pulses throughout, no edema, no cyanosis no clubbing    EKG:  EKG is  ordered today. Sinus tachycardia, rate 71, axis within normal limits, intervals within normal limits, no acute ST-T wave changes.   Recent Labs: 10/14/2017: TSH 1.600 11/09/2017: Magnesium 2.0 11/24/2017: ALT 26 04/30/2018: BUN 16; Creatinine, Ser 1.08; Potassium 4.8; Sodium 143 06/12/2018: Hemoglobin 14.0; Platelets 280    Lipid Panel    Component Value Date/Time   CHOL 218 (H) 06/12/2018 1456   TRIG 171 (H) 06/12/2018 1456   HDL 31 (L) 06/12/2018 1456   CHOLHDL 7.0 (H) 06/12/2018 1456   LDLCALC 153 (H) 06/12/2018 1456      Wt Readings from Last 3 Encounters:  07/27/18 253 lb 12.8 oz (115.1 kg)  04/13/18 257 lb (116.6 kg)  03/31/18 260 lb 6.4 oz (118.1 kg)      Other studies Reviewed: Additional studies/ records that were reviewed today include: None Review of the above records demonstrates:   ASSESSMENT AND PLAN:   CARDIOMYOPATHY:    Today I am going to increase his Entresto.  He tolerated the lower heart rates that he is describing he was just concerned.  I will follow him up in about 2 months and consider another echo in about 3 months.  HTN:  This is being managed in the context of treating his CHF.  His blood pressure is slightly elevated but he says this is quite unusual and at home it is typically 120/70.  He will bring his blood pressure cuff to check with ours.  BRADYCARDIA:   Previously he had tachycardia.    He is tolerating a lower heart rhythm.  No change in therapy.  Current medicines are reviewed at length with the patient today.  The patient does not have concerns regarding medicines.  The following changes have been made:  As above  Labs/ tests ordered today include:     Orders Placed This Encounter  Procedures  . EKG 12-Lead     Disposition:   FU with me in 2 months in the office.     Signed, [name], MD   07/27/2018 10:54 AM    [facility]

## 2018-07-27 ENCOUNTER — Encounter: Payer: Self-pay | Admitting: Cardiology

## 2018-07-27 ENCOUNTER — Ambulatory Visit: Payer: PRIVATE HEALTH INSURANCE | Admitting: Cardiology

## 2018-07-27 ENCOUNTER — Other Ambulatory Visit: Payer: Self-pay

## 2018-07-27 VITALS — BP 168/118 | HR 71 | Ht 68.0 in | Wt 253.8 lb

## 2018-07-27 DIAGNOSIS — I10 Essential (primary) hypertension: Secondary | ICD-10-CM | POA: Diagnosis not present

## 2018-07-27 DIAGNOSIS — I5042 Chronic combined systolic (congestive) and diastolic (congestive) heart failure: Secondary | ICD-10-CM

## 2018-07-27 DIAGNOSIS — R001 Bradycardia, unspecified: Secondary | ICD-10-CM | POA: Diagnosis not present

## 2018-07-27 MED ORDER — ENTRESTO 97-103 MG PO TABS: 1.0000 | ORAL_TABLET | Freq: Two times a day (BID) | ORAL | 5 refills | Status: DC

## 2018-07-27 NOTE — Patient Instructions (Signed)
Medication Instructions:  INCREASE YOUR ENTRESTO TO 97/103 MG TWICE A DAY    If you need a refill on your cardiac medications before your next appointment, please call your pharmacy.   Lab work: NONE If you have labs (blood work) drawn today and your tests are completely normal, you will receive your results only by: Marland Kitchen MyChart Message (if you have MyChart) OR . A paper copy in the mail If you have any lab test that is abnormal or we need to change your treatment, we will call you to review the results.  Testing/Procedures: NONE  Follow-Up: At [facility], you and your health needs are our priority.  As part of our continuing mission to provide you with exceptional heart care, we have created designated Provider Care Teams.  These Care Teams include your primary Cardiologist (physician) and Advanced Practice Providers (APPs -  Physician Assistants and Nurse Practitioners) who all work together to provide you with the care you need, when you need it. You will need a follow up appointment in 2 months.  You may see [name], MD or one of the following Advanced Practice Providers on your designated Care Team:   [name], PA-C . Jory Sims, DNP, ANP

## 2018-07-28 ENCOUNTER — Telehealth: Payer: Self-pay

## 2018-07-28 NOTE — Telephone Encounter (Signed)
Patient seen in office 7/13 

## 2018-07-28 NOTE — Telephone Encounter (Signed)
Prior Auth was started for [employer] 97-103mg 

## 2018-08-03 ENCOUNTER — Telehealth: Payer: Self-pay

## 2018-08-03 NOTE — Telephone Encounter (Signed)
Prior Auth for [employer] ( Sacubitril-Valsartan 97-103mg  ) was approved

## 2018-08-12 ENCOUNTER — Telehealth: Payer: Self-pay | Admitting: Cardiology

## 2018-08-12 NOTE — Telephone Encounter (Signed)
New Message     Pts wife is calling about a prior authorization for Entresto  [facility] [address] [facility] [place]  Please call pt back

## 2018-08-12 NOTE — Telephone Encounter (Signed)
Follow up   Pts wife is calling back and says he is out of Delene Loll and is wondering if he can get samples until the prior auhtoriziation goes through     Please call

## 2018-08-12 NOTE — Telephone Encounter (Signed)
Entresto 49-51 mg samples available. Phoned patient, informed that samples are 1/2 of usual dose so he will need to take 2 tabs twice daily and that samples will be waiting for him at the front desk during office hours.

## 2018-10-08 NOTE — Progress Notes (Signed)
Cardiology Office Note   Date:  10/09/2018   ID:  Bryan English., DOB 03-17-1965, MRN [id number]  PCP:  [name], MD  Cardiologist:   [name], MD    Chief Complaint  Patient presents with   Cardiomyopathy      History of Present Illness: Bryan English. is a 53 y.o. male who was referred by himself for evaluation of chest pain.    He had had multiple ED visits for chest pain. He was not found to have any evidence for ischemia.  In a previous visit in August he had had negative CT for PE.  He did have coronary calcification in the LAD.   When I saw him I sent him for an echo.  He was found to have an EF of 20%.  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  We have titrated his meds.    His EF in Jan was slightly better with at 25 - 30%.  He has had bradycardia.  At the last visit I increased his Entresto.   Since I last saw him he has done well.  The patient denies any new symptoms such as chest discomfort, neck or arm discomfort. There has been no new shortness of breath, PND or orthopnea. There have been no reported palpitations, presyncope or syncope.   Past Medical History:  Diagnosis Date   Diabetes mellitus without complication ([place])    Hypertension    NICM (nonischemic cardiomyopathy) ([place]) 10/29/2017   EF 20% by echo, minimal CAD at cath    Past Surgical History:  Procedure Laterality Date   LEFT HEART CATH AND CORONARY ANGIOGRAPHY N/A 10/29/2017   Procedure: LEFT HEART CATH AND CORONARY ANGIOGRAPHY;  Surgeon: [name], MD;  Location: [facility];  Service: Cardiovascular;  Laterality: N/A;     Current Outpatient Medications  Medication Sig Dispense Refill   albuterol (PROVENTIL HFA;VENTOLIN HFA) 108 (90 Base) MCG/ACT inhaler Inhale 1-2 puffs into the lungs every 6 (six) hours as needed for wheezing or shortness of breath. 1 Inhaler 2   Blood Glucose Monitoring Suppl (TRUE METRIX AIR GLUCOSE METER) w/Device KIT 1  kit by Does not apply route 2 (two) times daily. 1 kit 0   glucose blood (TRUE METRIX BLOOD GLUCOSE TEST) test strip Use as instructed to check blood sugars twice daily and as needed 100 each 3   metFORMIN (GLUCOPHAGE) 1000 MG tablet TAKE 1 TABLET BY MOUTH EVERY EVENING. 30 tablet 2   metoprolol succinate (TOPROL-XL) 200 MG 24 hr tablet Take 1 tablet (200 mg total) by mouth daily. Take with or immediately following a meal. 90 tablet 3   pantoprazole (PROTONIX) 40 MG tablet Take 1 tablet (40 mg total) by mouth daily. 30 tablet 2   rosuvastatin (CRESTOR) 10 MG tablet Take 1 tablet (10 mg total) by mouth daily. To lower cholesterol 30 tablet 11   sacubitril-valsartan (ENTRESTO) 97-103 MG Take 1 tablet by mouth 2 (two) times daily. 60 tablet 5   TRUEPLUS LANCETS 28G MISC Use twice daily to check blood sugars and as needed 100 each 3   spironolactone (ALDACTONE) 25 MG tablet Take 1 tablet (25 mg total) by mouth daily. 90 tablet 3   No current facility-administered medications for this visit.     Allergies:   Patient has no known allergies.    ROS:  Please see the history of present illness.   Otherwise, review of systems are positive for none.  All other systems are reviewed and negative.    PHYSICAL EXAM: VS:  BP (!) 167/91    Pulse 79    Temp 98.1 F (36.7 C) (Temporal)    Ht 5' 8.5" (1.74 m)    Wt 253 lb 6.4 oz (114.9 kg)    SpO2 98%    BMI 37.97 kg/m  , BMI Body mass index is 37.97 kg/m. GENERAL:  Well appearing NECK:  No jugular venous distention, waveform within normal limits, carotid upstroke brisk and symmetric, no bruits, no thyromegaly LUNGS:  Clear to auscultation bilaterally CHEST:  Unremarkable HEART:  PMI not displaced or sustained,S1 and S2 within normal limits, no S3, no S4, no clicks, no rubs, no murmurs ABD:  Flat, positive bowel sounds normal in frequency in pitch, no bruits, no rebound, no guarding, no midline pulsatile mass, no hepatomegaly, no splenomegaly EXT:   2 plus pulses throughout, no edema, no cyanosis no clubbing   EKG:  EKG is not ordered today.   Recent Labs: 10/14/2017: TSH 1.600 11/09/2017: Magnesium 2.0 11/24/2017: ALT 26 04/30/2018: BUN 16; Creatinine, Ser 1.08; Potassium 4.8; Sodium 143 06/12/2018: Hemoglobin 14.0; Platelets 280    Lipid Panel    Component Value Date/Time   CHOL 218 (H) 06/12/2018 1456   TRIG 171 (H) 06/12/2018 1456   HDL 31 (L) 06/12/2018 1456   CHOLHDL 7.0 (H) 06/12/2018 1456   LDLCALC 153 (H) 06/12/2018 1456      Wt Readings from Last 3 Encounters:  10/09/18 253 lb 6.4 oz (114.9 kg)  07/27/18 253 lb 12.8 oz (115.1 kg)  04/13/18 257 lb (116.6 kg)      Other studies Reviewed: Additional studies/ records that were reviewed today include: Labs Review of the above records demonstrates: See below  ASSESSMENT AND PLAN:   CARDIOMYOPATHY:     Today we will start spironolactone.  I will follow guidelines for spironolactone follow up in heart failure.  (Check potassium levels and  renal function 3--4 days and 1 week, then at least monthly for first 3 months and every 3 months thereafter after initiation of spironolactone.)  I will order an echocardiogram in January and will see him after that.  HTN:   This is being treated in the context of managing his heart failure. k with ours.  BRADYCARDIA:   He tolerates this rhythm.  No change in therapy other than above.    Current medicines are reviewed at length with the patient today.  The patient does not have concerns regarding medicines.  The following changes have been made:  As above  Labs/ tests ordered today include:     Orders Placed This Encounter  Procedures   Basic metabolic panel   ECHOCARDIOGRAM COMPLETE     Disposition:   FU with me in January.   Signed, [name], MD  10/09/2018 4:40 PM    [facility]

## 2018-10-09 ENCOUNTER — Encounter: Payer: Self-pay | Admitting: Cardiology

## 2018-10-09 ENCOUNTER — Other Ambulatory Visit: Payer: Self-pay

## 2018-10-09 ENCOUNTER — Ambulatory Visit (INDEPENDENT_AMBULATORY_CARE_PROVIDER_SITE_OTHER): Payer: PRIVATE HEALTH INSURANCE | Admitting: Cardiology

## 2018-10-09 VITALS — BP 167/91 | HR 79 | Temp 98.1°F | Ht 68.5 in | Wt 253.4 lb

## 2018-10-09 DIAGNOSIS — I10 Essential (primary) hypertension: Secondary | ICD-10-CM | POA: Diagnosis not present

## 2018-10-09 DIAGNOSIS — R001 Bradycardia, unspecified: Secondary | ICD-10-CM | POA: Diagnosis not present

## 2018-10-09 DIAGNOSIS — I428 Other cardiomyopathies: Secondary | ICD-10-CM

## 2018-10-09 MED ORDER — SPIRONOLACTONE 25 MG PO TABS: 25.0000 mg | ORAL_TABLET | Freq: Every day | ORAL | 3 refills | Status: DC

## 2018-10-09 NOTE — Patient Instructions (Addendum)
Medication Instructions:  Start taking 25mg  Spironolactone daily.   If you need a refill on your cardiac medications before your next appointment, please call your pharmacy.   Lab work:  Check your labs on Monday or Tuesday and Friday of next week. Then at least monthly for first 3 months.  After 3 months, once every 3 months. If you have labs (blood work) drawn today and your tests are completely normal, you will receive your results only by: [place] (if you have MyChart) OR A paper copy in the mail If you have any lab test that is abnormal or we need to change your treatment, we will call you to review the results.  Testing/Procedures: Your physician has requested that you have an echocardiogram in January 2021. Echocardiography is a painless test that uses sound waves to create images of your heart. It provides your doctor with information about the size and shape of your heart and how well your heart's chambers and valves are working. This procedure takes approximately one hour. There are no restrictions for this procedure. [place] [address]   Follow-Up: At [facility], you and your health needs are our priority.  As part of our continuing mission to provide you with exceptional heart care, we have created designated Provider Care Teams.  These Care Teams include your primary Cardiologist (physician) and Advanced Practice Providers (APPs -  Physician Assistants and Nurse Practitioners) who all work together to provide you with the care you need, when you need it. You will need a follow up appointment in 4 months after echo.  Please call our office 2 months in advance to schedule this appointment.  You may see [name], MD or one of the following Advanced Practice Providers on your designated Care Team:   [name], PA-C Jory Sims, DNP, ANP  Any Other Special Instructions Will Be Listed Below (If Applicable).  Your Echo will be scheduled for  January 2021.

## 2018-10-15 ENCOUNTER — Other Ambulatory Visit: Payer: Self-pay

## 2018-10-15 ENCOUNTER — Ambulatory Visit: Payer: PRIVATE HEALTH INSURANCE | Attending: Family Medicine | Admitting: Family Medicine

## 2018-10-15 VITALS — BP 157/94 | HR 74 | Temp 98.0°F | Ht 68.5 in | Wt 255.4 lb

## 2018-10-15 DIAGNOSIS — K219 Gastro-esophageal reflux disease without esophagitis: Secondary | ICD-10-CM

## 2018-10-15 DIAGNOSIS — E1169 Type 2 diabetes mellitus with other specified complication: Secondary | ICD-10-CM

## 2018-10-15 DIAGNOSIS — Z79899 Other long term (current) drug therapy: Secondary | ICD-10-CM

## 2018-10-15 DIAGNOSIS — Z114 Encounter for screening for human immunodeficiency virus [HIV]: Secondary | ICD-10-CM

## 2018-10-15 DIAGNOSIS — Z1211 Encounter for screening for malignant neoplasm of colon: Secondary | ICD-10-CM

## 2018-10-15 DIAGNOSIS — I10 Essential (primary) hypertension: Secondary | ICD-10-CM

## 2018-10-15 DIAGNOSIS — I428 Other cardiomyopathies: Secondary | ICD-10-CM

## 2018-10-15 DIAGNOSIS — E785 Hyperlipidemia, unspecified: Secondary | ICD-10-CM

## 2018-10-15 DIAGNOSIS — L409 Psoriasis, unspecified: Secondary | ICD-10-CM

## 2018-10-15 DIAGNOSIS — E1165 Type 2 diabetes mellitus with hyperglycemia: Secondary | ICD-10-CM

## 2018-10-15 DIAGNOSIS — M25511 Pain in right shoulder: Secondary | ICD-10-CM

## 2018-10-15 LAB — POCT GLYCOSYLATED HEMOGLOBIN (HGB A1C): HbA1c, POC (controlled diabetic range): 6.2 % (ref 0.0–7.0)

## 2018-10-15 LAB — GLUCOSE, POCT (MANUAL RESULT ENTRY): POC Glucose: 130 mg/dL — AB (ref 70–99)

## 2018-10-15 MED ORDER — CLOBETASOL PROPIONATE 0.05 % EX CREA: 1.0000 "application " | TOPICAL_CREAM | Freq: Two times a day (BID) | CUTANEOUS | 99 refills | Status: DC

## 2018-10-15 MED ORDER — PANTOPRAZOLE SODIUM 40 MG PO TBEC: 40.0000 mg | DELAYED_RELEASE_TABLET | Freq: Every day | ORAL | 4 refills | Status: DC

## 2018-10-15 MED ORDER — METFORMIN HCL 1000 MG PO TABS: 1000.0000 mg | ORAL_TABLET | Freq: Every evening | ORAL | 1 refills | Status: DC

## 2018-10-15 NOTE — Progress Notes (Signed)
Established Patient Office Visit  Subjective:  Patient ID: Bryan Hoh., male    DOB: [birth year]  Age: 53 y.o. MRN: [id number]  CC:  Chief Complaint  Patient presents with  . Diabetes    HPI Bryan Greet., 53 year old male, presents for follow-up of chronic medical issues including type 2 diabetes, hyperlipidemia, cardiomyopathy, GERD and patient with complaint of acute on chronic right shoulder pain.  He feels that his diabetes has been doing well.  Patient reports home blood sugars are generally 110s to 130s at the highest fasting.  He denies any current issues with increased thirst, blurred vision or urinary frequency.  He continues to take Metformin.  Patient has not currently been taking his cholesterol medication, rosuvastatin.  He did not have any problems with the medicine but was just unsure if he needed it.  Patient reports that his blood pressure has been controlled and he has had no issues with peripheral edema and no chest pain.  He does follow-up with cardiology and he is also currently taking Entresto.  He has been prescribed spironolactone but has not yet started this medication.  He reports that his acid reflux symptoms are controlled with use of pantoprazole.  He also has psoriasis and request a cream to help with the areas of dry skin.  He is not sure of the exact cause but has had recent increase in right shoulder pain which is now around 8 on a 0-to-10 scale and is worse if he tries to reach overhead or reach behind with his right hand.  He also has increased pain/difficulty if he tries to sleep on his right side.  He has had past issues with right shoulder pain but pain has increased recently. Past Medical History:  Diagnosis Date  . Diabetes mellitus without complication ([place])   . Hypertension   . NICM (nonischemic cardiomyopathy) ([place]) 10/29/2017   EF 20% by echo, minimal CAD at cath    Past Surgical History:  Procedure Laterality Date  .  LEFT HEART CATH AND CORONARY ANGIOGRAPHY N/A 10/29/2017   Procedure: LEFT HEART CATH AND CORONARY ANGIOGRAPHY;  Surgeon: [name], MD;  Location: [facility];  Service: Cardiovascular;  Laterality: N/A;    Family History  Adopted: Yes  Family history unknown: Yes    Social History   Socioeconomic History  . Marital status: Married    Spouse name: Not on file  . Number of children: Not on file  . Years of education: Not on file  . Highest education level: Not on file  Occupational History  . Not on file  Social Needs  . Financial resource strain: Not on file  . Food insecurity    Worry: Not on file    Inability: Not on file  . Transportation needs    Medical: Not on file    Non-medical: Not on file  Tobacco Use  . Smoking status: Never Smoker  . Smokeless tobacco: Never Used  Substance and Sexual Activity  . Alcohol use: Not Currently  . Drug use: Never  . Sexual activity: Not on file  Lifestyle  . Physical activity    Days per week: Not on file    Minutes per session: Not on file  . Stress: Not on file  Relationships  . Social Herbalist on phone: Not on file    Gets together: Not on file    Attends religious service: Not on file  Active member of club or organization: Not on file    Attends meetings of clubs or organizations: Not on file    Relationship status: Not on file  . Intimate partner violence    Fear of current or ex partner: Not on file    Emotionally abused: Not on file    Physically abused: Not on file    Forced sexual activity: Not on file  Other Topics Concern  . Not on file  Social History Narrative   Married.  Lives with wife.  Two children natural.       Outpatient Medications Prior to Visit  Medication Sig Dispense Refill  . albuterol (PROVENTIL HFA;VENTOLIN HFA) 108 (90 Base) MCG/ACT inhaler Inhale 1-2 puffs into the lungs every 6 (six) hours as needed for wheezing or shortness of breath. 1 Inhaler 2  . Blood  Glucose Monitoring Suppl (TRUE METRIX AIR GLUCOSE METER) w/Device KIT 1 kit by Does not apply route 2 (two) times daily. 1 kit 0  . glucose blood (TRUE METRIX BLOOD GLUCOSE TEST) test strip Use as instructed to check blood sugars twice daily and as needed 100 each 3  . metFORMIN (GLUCOPHAGE) 1000 MG tablet TAKE 1 TABLET BY MOUTH EVERY EVENING. 30 tablet 2  . metoprolol succinate (TOPROL-XL) 200 MG 24 hr tablet Take 1 tablet (200 mg total) by mouth daily. Take with or immediately following a meal. 90 tablet 3  . pantoprazole (PROTONIX) 40 MG tablet Take 1 tablet (40 mg total) by mouth daily. 30 tablet 2  . sacubitril-valsartan (ENTRESTO) 97-103 MG Take 1 tablet by mouth 2 (two) times daily. 60 tablet 5  . spironolactone (ALDACTONE) 25 MG tablet Take 1 tablet (25 mg total) by mouth daily. 90 tablet 3  . TRUEPLUS LANCETS 28G MISC Use twice daily to check blood sugars and as needed 100 each 3  . rosuvastatin (CRESTOR) 10 MG tablet Take 1 tablet (10 mg total) by mouth daily. To lower cholesterol (Patient not taking: Reported on 10/15/2018) 30 tablet 11   No facility-administered medications prior to visit.     No Known Allergies  ROS Review of Systems  Constitutional: Positive for fatigue (mild). Negative for chills and fever.  HENT: Negative for sore throat.   Eyes: Negative for photophobia and visual disturbance.  Respiratory: Negative for cough and choking.   Cardiovascular: Negative for chest pain and leg swelling.  Gastrointestinal: Negative for abdominal pain, blood in stool, constipation, diarrhea, nausea and rectal pain.  Endocrine: Negative for cold intolerance, heat intolerance, polydipsia, polyphagia and polyuria.  Genitourinary: Negative for dysuria and frequency.  Musculoskeletal: Positive for arthralgias. Negative for back pain.  Skin: Positive for rash (areas of dry flaky skin, psoriarsis). Negative for wound.  Neurological: Negative for dizziness and headaches.  Hematological:  Negative for adenopathy. Does not bruise/bleed easily.      Objective:    Physical Exam  Constitutional: He is oriented to person, place, and time. He appears well-developed and well-nourished.  Overweight for height/obese  Neck: Normal range of motion. Neck supple. No JVD present. No thyromegaly present.  Cardiovascular: Normal rate and regular rhythm.  No carotid bruit  Pulmonary/Chest: Effort normal and breath sounds normal.  Abdominal: Soft. There is no abdominal tenderness. There is no rebound and no guarding.  Truncal obesity  Musculoskeletal:        General: No tenderness or edema.     Comments: No CVA tenderness  Lymphadenopathy:    He has no cervical adenopathy.  Neurological: He is  alert and oriented to person, place, and time.  Skin: Skin is warm and dry. Rash (psoriatric plaques on extensor surfaces below the elbows with flaky dry skin) noted.  Psychiatric: He has a normal mood and affect. His behavior is normal.  Nursing note and vitals reviewed. DM foot exam: 1 plus posterior tibial and dorsalis pedis pulses, normal toe nails, no foot deformities. Mild callus formation on heels which have shaved. Normal right foot monofilament on 10/10 areas tested and patient with absent sensation with monofilament testing on left heel  BP (!) 157/94   Pulse 74   Temp 98 F (36.7 C) (Oral)   Ht 5' 8.5" (1.74 m)   Wt 255 lb 6.4 oz (115.8 kg)   SpO2 98%   BMI 38.27 kg/m  Wt Readings from Last 3 Encounters:  10/15/18 255 lb 6.4 oz (115.8 kg)  10/09/18 253 lb 6.4 oz (114.9 kg)  07/27/18 253 lb 12.8 oz (115.1 kg)     Health Maintenance Due  Topic Date Due  . PNEUMOCOCCAL POLYSACCHARIDE VACCINE AGE 96-64 HIGH RISK  05/02/1966  . FOOT EXAM  05/02/1974  . OPHTHALMOLOGY EXAM  05/02/1974  . HIV Screening  05/02/1979  . TETANUS/TDAP  05/02/1983  . COLONOSCOPY  05/02/2014  . URINE MICROALBUMIN  11/25/2018    Lab Results  Component Value Date   TSH 1.600 10/14/2017   Lab  Results  Component Value Date   WBC 10.0 06/12/2018   HGB 14.0 06/12/2018   HCT 43.3 06/12/2018   MCV 80 06/12/2018   PLT 280 06/12/2018   Lab Results  Component Value Date   NA 143 04/30/2018   K 4.8 04/30/2018   CO2 22 04/30/2018   GLUCOSE 124 (H) 04/30/2018   BUN 16 04/30/2018   CREATININE 1.08 04/30/2018   BILITOT 0.3 11/24/2017   ALKPHOS 85 11/24/2017   AST 22 11/24/2017   ALT 26 11/24/2017   PROT 7.5 11/24/2017   ALBUMIN 4.4 11/24/2017   CALCIUM 9.7 04/30/2018   ANIONGAP 14 11/09/2017   Lab Results  Component Value Date   CHOL 218 (H) 06/12/2018   Lab Results  Component Value Date   HDL 31 (L) 06/12/2018   Lab Results  Component Value Date   LDLCALC 153 (H) 06/12/2018   Lab Results  Component Value Date   TRIG 171 (H) 06/12/2018   Lab Results  Component Value Date   CHOLHDL 7.0 (H) 06/12/2018   Lab Results  Component Value Date   HGBA1C 6.8 (H) 06/12/2018      Assessment & Plan:  1. Type 2 diabetes mellitus with hyperglycemia, without long-term current use of insulin (HCC) Patient's blood sugar remains well controlled.  Patient had glucose of 130 at today's visit and hemoglobin A1c of 6.2.  Continue metformin as well as a healthy diet.  Patient will have comprehensive metabolic panel at today's visit as well as microalbumin/creatinine ratio.  Diabetic foot care discussed.  Ophthalmology referral placed for yearly diabetic eye exam. - POCT glucose (manual entry) - POCT glycosylated hemoglobin (Hb A1C) - Comprehensive metabolic panel; Future - metFORMIN (GLUCOPHAGE) 1000 MG tablet; Take 1 tablet (1,000 mg total) by mouth every evening.  Dispense: 90 tablet; Refill: 1 - Ambulatory referral to Ophthalmology - Microalbumin/Creatinine Ratio, Urine; Future  2.  Nonischemic cardiomyopathy Patient's nonischemic cardiomyopathy appears to be stable.  He was recently prescribed spironolactone to start in addition to his metoprolol and Entresto however patient  has not yet started this medication.  Patient is having  recheck electrolytes done as part of comprehensive metabolic panel. - Comprehensive metabolic panel; Future  3. Essential hypertension Blood pressure is elevated at today's visit and consideration was given to adding another medication for control of blood pressure however patient on review of cardiology note was recently prescribed spironolactone which she has not yet started.  Patient states that after starting the medication he will have lab follow-up with cardiology and he can also have his blood pressure checked at that time.  In the interim he will continue low-sodium diet, remain compliant with metoprolol and Entresto.  4. Dyslipidemia associated with type 2 diabetes mellitus ([place]) Discussed importance of compliance with cholesterol medication and lowering of lipids to help reduce risk of CAD associated with being diabetic.  Patient had lipid panel done Jun 12, 2018.  He has been asked to restart Crestor and make lab visit to recheck LFT's in 6 weeks after starting Crestor.   5. Gastroesophageal reflux disease without esophagitis Continue the use of pantoprazole and avoid known trigger foods as well as avoidance of late night eating.  - pantoprazole (PROTONIX) 40 MG tablet; Take 1 tablet (40 mg total) by mouth daily.  Dispense: 90 tablet; Refill: 4  6. Encounter for long-term current use of medication CMET in follow-up of long term use of medications for treatment of DM, Dyslipidemia, HTN and cardiomyopathy.  - Comprehensive metabolic panel; Future  7. Screening for colon cancer Patient will be referred for screening colonoscopy - Ambulatory referral to Gastroenterology  8. Screening for HIV without presence of risk factors He was offered and agreed to have screening for HIV - HIV antibody (with reflex); Future  9. Acute pain of right shoulder He has complaint of a recent increase in right shoulder pain-Orthopedics referral  placed - AMB referral to orthopedics  10. Psoriasis Rx for temovate cream for psoriasis treatment - clobetasol cream (TEMOVATE) 0.05 %; Apply 1 application topically 2 (two) times daily.  Dispense: 60 g; Refill: prn  An After Visit Summary was printed and given to the patient.  Follow-up: Return in about 5 months (around 03/15/2019) for DM/Medication-6 week lab visit.   [name], MD

## 2018-10-15 NOTE — Patient Instructions (Signed)
Diabetes Mellitus and Standards of Medical Care Managing diabetes (diabetes mellitus) can be complicated. Your diabetes treatment may be managed by a team of health care providers, including:  A physician who specializes in diabetes (endocrinologist).  A nurse practitioner or physician assistant.  Nurses.  A diet and nutrition specialist (registered dietitian).  A certified diabetes educator (CDE).  An exercise specialist.  A pharmacist.  An eye doctor.  A foot specialist (podiatrist).  A dentist.  A primary care provider.  A mental health provider. Your health care providers follow guidelines to help you get the best quality of care. The following schedule is a general guideline for your diabetes management plan. Your health care providers may give you more specific instructions. Physical exams Upon being diagnosed with diabetes mellitus, and each year after that, your health care provider will ask about your medical and family history. He or she will also do a physical exam. Your exam may include:  Measuring your height, weight, and body mass index (BMI).  Checking your blood pressure. This will be done at every routine medical visit. Your target blood pressure may vary depending on your medical conditions, your age, and other factors.  Thyroid gland exam.  Skin exam.  Screening for damage to your nerves (peripheral neuropathy). This may include checking the pulse in your legs and feet and checking the level of sensation in your hands and feet.  A complete foot exam to inspect the structure and skin of your feet, including checking for cuts, bruises, redness, blisters, sores, or other problems.  Screening for blood vessel (vascular) problems, which may include checking the pulse in your legs and feet and checking your temperature. Blood tests Depending on your treatment plan and your personal needs, you may have the following tests done:  HbA1c (hemoglobin A1c). This  test provides information about blood sugar (glucose) control over the previous 2-3 months. It is used to adjust your treatment plan, if needed. This test will be done: ? At least 2 times a year, if you are meeting your treatment goals. ? 4 times a year, if you are not meeting your treatment goals or if treatment goals have changed.  Lipid testing, including total, LDL, and HDL cholesterol and triglyceride levels. ? The goal for LDL is less than 100 mg/dL (5.5 mmol/L). If you are at high risk for complications, the goal is less than 70 mg/dL (3.9 mmol/L). ? The goal for HDL is 40 mg/dL (2.2 mmol/L) or higher for men and 50 mg/dL (2.8 mmol/L) or higher for women. An HDL cholesterol of 60 mg/dL (3.3 mmol/L) or higher gives some protection against heart disease. ? The goal for triglycerides is less than 150 mg/dL (8.3 mmol/L).  Liver function tests.  Kidney function tests.  Thyroid function tests. Dental and eye exams  Visit your dentist two times a year.  If you have type 1 diabetes, your health care provider may recommend an eye exam 3-5 years after you are diagnosed, and then once a year after your first exam. ? For children with type 1 diabetes, a health care provider may recommend an eye exam when your child is age 10 or older and has had diabetes for 3-5 years. After the first exam, your child should get an eye exam once a year.  If you have type 2 diabetes, your health care provider may recommend an eye exam as soon as you are diagnosed, and then once a year after your first exam. Immunizations   The  yearly flu (influenza) vaccine is recommended for everyone 6 months or older who has diabetes.  The pneumonia (pneumococcal) vaccine is recommended for everyone 2 years or older who has diabetes. If you are 65 or older, you may get the pneumonia vaccine as a series of two separate shots.  The hepatitis B vaccine is recommended for adults shortly after being diagnosed with diabetes.   Adults and children with diabetes should receive all other vaccines according to age-specific recommendations from the Centers for Disease Control and Prevention (CDC). Mental and emotional health Screening for symptoms of eating disorders, anxiety, and depression is recommended at the time of diagnosis and afterward as needed. If your screening shows that you have symptoms (positive screening result), you may need more evaluation and you may work with a mental health care provider. Treatment plan Your treatment plan will be reviewed at every medical visit. You and your health care provider will discuss:  How you are taking your medicines, including insulin.  Any side effects you are experiencing.  Your blood glucose target goals.  The frequency of your blood glucose monitoring.  Lifestyle habits, such as activity level as well as tobacco, alcohol, and substance use. Diabetes self-management education Your health care provider will assess how well you are monitoring your blood glucose levels and whether you are taking your insulin correctly. He or she may refer you to:  A certified diabetes educator to manage your diabetes throughout your life, starting at diagnosis.  A registered dietitian who can create or review your personal nutrition plan.  An exercise specialist who can discuss your activity level and exercise plan. Summary  Managing diabetes (diabetes mellitus) can be complicated. Your diabetes treatment may be managed by a team of health care providers.  Your health care providers follow guidelines in order to help you get the best quality of care.  Standards of care including having regular physical exams, blood tests, blood pressure monitoring, immunizations, screening tests, and education about how to manage your diabetes.  Your health care providers may also give you more specific instructions based on your individual health. This information is not intended to replace  advice given to you by your health care provider. Make sure you discuss any questions you have with your health care provider. Document Released: 10/28/2008 Document Revised: 09/19/2017 Document Reviewed: 09/29/2015 Elsevier Patient Education  2020 Elsevier Inc.  

## 2018-10-19 ENCOUNTER — Ambulatory Visit (INDEPENDENT_AMBULATORY_CARE_PROVIDER_SITE_OTHER): Payer: PRIVATE HEALTH INSURANCE | Admitting: Family Medicine

## 2018-10-19 ENCOUNTER — Encounter: Payer: Self-pay | Admitting: Family Medicine

## 2018-10-19 DIAGNOSIS — M25511 Pain in right shoulder: Secondary | ICD-10-CM

## 2018-10-19 DIAGNOSIS — M25512 Pain in left shoulder: Secondary | ICD-10-CM

## 2018-10-19 DIAGNOSIS — M7541 Impingement syndrome of right shoulder: Secondary | ICD-10-CM | POA: Diagnosis not present

## 2018-10-19 NOTE — Progress Notes (Signed)
Bryan English Donnetta Hail. - 53 y.o. male MRN [id number]  Date of birth: 02/20/1965  Office Visit Note: Visit Date: 10/19/2018 PCP: [name], MD Referred by: [name], MD  Subjective: Chief Complaint  Patient presents with  . Pain bil shldrs/back btw shldr blades x 2 wks   HPI: Bryan English. is a 53 y.o. male who comes in today with bilateral shoulder pain, right worse than left, for the past two weeks. He reports that pain began two weeks ago without any inciting event or injury. Denies new exercise recently. Reports that pain is worse with overhead activities. Unable to sleep on right side at night. 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 Also used to do heavy weightlifting. This is the first time his shoulders have really been painful. Left shoulder is mild. Denies back pain, neck pain, numbness, tingling, weakness. Has taken a few tylenol for pain but overall avoids medication.   T2DM, well controlled - most recent HgbA1c was 6.2  ROS Otherwise per HPI.  Assessment & Plan: Visit Diagnoses:  1. Acute pain of both shoulders   2. Impingement syndrome of right shoulder     Plan:  - subacromial corticosteroid injection in right shoulder - will call back at end of week with no improvement; will refer to PT at that time     Follow-up: PRN  Procedures: Procedure performed: subacromial corticosteroid injection; palpation guided  Consent obtained and verified. Time-out conducted. Noted no overlying erythema, induration, or other signs of local infection. The right posterior subacromial space was palpated and marked. The overlying skin was prepped in a sterile fashion. Topical analgesic spray: Ethyl chloride. Joint: right subacromial Needle: 1.5 inch, 25 gauge Completed without difficulty. Meds: methylpred 40 mg, lidocaine 5 cc    Clinical History: No specialty comments available.   He reports that he has never smoked.  He has never used smokeless tobacco.  Recent Labs    10/23/17 1127 06/12/18 1456 10/15/18 1103  HGBA1C 9.2*  9.2*  9.2  9.2* 6.8* 6.2    Objective:  VS:  HT:    WT:   BMI:     BP:   HR: bpm  TEMP: ( )  RESP:  Physical Exam  PHYSICAL EXAM: Gen: NAD, alert, cooperative with exam, well-appearing HEENT: clear conjunctiva,  CV:  no edema, capillary refill brisk, normal rate Resp: non-labored Skin: plaques noted on extensor surfaces of elbows and on right lateral torso  Neuro: no gross deficits.  Psych:  alert and oriented  Ortho Exam  Right Shoulder: Inspection reveals no obvious deformity, atrophy, or asymmetry. No bruising. No swelling Palpation TTP over bicipital groove. Full ROM in internal/external rotation. Flexion limited to 170 degrees.  NV intact distally Normal scapular function observed. Special Tests:  - Impingement: Positive Hawkins, neers - Supraspinatous: Negative empty can.  5/5 strength with resisted flexion at 20 degrees - Infraspinatous/Teres Minor: 5/5 strength with ER - Subscapularis: 5/5 strength with IR - Biceps tendon: Slight pain with Speeds, negative Yerrgason's  - Labrum: good stability - AC Joint: Negative cross arm - No painful arc   Left Shoulder: Inspection reveals no obvious deformity, atrophy, or asymmetry. No bruising. No swelling Palpation is normal with no TTP over [facility] joint or bicipital groove. Full ROM in flexion, abduction, internal/external rotation NV intact distally Normal scapular function observed. Special Tests:  - Impingement: Neg Hawkins, neers, empty can sign. - Supraspinatous: Negative empty can.  5/5 strength with  resisted flexion at 20 degrees - Infraspinatous/Teres Minor: 5/5 strength with ER - Subscapularis: 5/5 strength with IR - Biceps tendon: Negative Speeds, Yerrgason's  - Labrum: good stability - AC Joint: Negative cross arm   Imaging: No results found.  Past Medical/Family/Surgical/Social History:  Medications & Allergies reviewed per EMR, new medications updated. Patient Active Problem List   Diagnosis Date Noted  . Type 2 diabetes mellitus with hyperglycemia, without long-term current use of insulin ([place]) 06/12/2018  . Class 2 severe obesity with serious comorbidity and body mass index (BMI) of 38.0 to 38.9 in adult [facility]) 06/12/2018  . Cardiomyopathy ([place]) 10/27/2017  . 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 Hypertension 10/14/2017  . Medication management 10/14/2017  . Tachycardia 10/14/2017   Past Medical History:  Diagnosis Date  . Diabetes mellitus without complication ([place])   . Hypertension   . NICM (nonischemic cardiomyopathy) ([place]) 10/29/2017   EF 20% by echo, minimal CAD at cath   Family History  Adopted: Yes  Family history unknown: Yes   Past Surgical History:  Procedure Laterality Date  . LEFT HEART CATH AND CORONARY ANGIOGRAPHY N/A 10/29/2017   Procedure: LEFT HEART CATH AND CORONARY ANGIOGRAPHY;  Surgeon: [name], MD;  Location: [place] [facility];  Service: Cardiovascular;  Laterality: N/A;   Social History   Occupational History  . Not on file  Tobacco Use  . Smoking status: Never Smoker  . Smokeless tobacco: Never Used  Substance and Sexual Activity  . Alcohol use: Not Currently  . Drug use: Never  . Sexual activity: Not on file

## 2018-10-19 NOTE — Progress Notes (Signed)
I saw and examined the patient with Dr. Mayer Masker and agree with assessment and plan as outlined.    Exam consistent with right shoulder impingement.  Diabetes under adequate control.  Will inject with cortisone today.  PT if still not improving.

## 2018-10-22 ENCOUNTER — Encounter: Payer: Self-pay | Admitting: *Deleted

## 2018-10-27 ENCOUNTER — Encounter: Payer: Self-pay | Admitting: Gastroenterology

## 2018-11-03 ENCOUNTER — Encounter: Payer: Self-pay | Admitting: Family Medicine

## 2018-11-06 ENCOUNTER — Telehealth: Payer: Self-pay | Admitting: *Deleted

## 2018-11-06 NOTE — Telephone Encounter (Signed)
Attempted pt again - LMTRC  

## 2018-11-06 NOTE — Telephone Encounter (Signed)
Explained to pt with EF < 35 will need OV- Canceled colon and PV  As scheduled- [facility] OV with Nevin Bloodgood 10-26 at 9 am - pt aware to arrive at 845 am 3rd floor- Marie pv

## 2018-11-06 NOTE — Telephone Encounter (Signed)
This pt has an EF of 35-30% per echo 01-19-2018  Cardiac Cath  2020 showed EF of 20 %- this pt needs an OV with Dr Silverio Decamp or APP- I called pt and LM to return my call to schedule the OV- We need to cancel his 10-26 Monday PV and his [place] colon 11-24-2018- he will have to  Be a hospital case after the [place]

## 2018-11-09 ENCOUNTER — Ambulatory Visit: Payer: PRIVATE HEALTH INSURANCE | Admitting: Nurse Practitioner

## 2018-11-24 ENCOUNTER — Encounter: Payer: PRIVATE HEALTH INSURANCE | Admitting: Gastroenterology

## 2018-11-26 ENCOUNTER — Other Ambulatory Visit: Payer: PRIVATE HEALTH INSURANCE

## 2019-01-25 ENCOUNTER — Other Ambulatory Visit: Payer: Self-pay

## 2019-01-25 ENCOUNTER — Ambulatory Visit (HOSPITAL_COMMUNITY): Payer: Self-pay | Attending: Cardiovascular Disease

## 2019-01-25 DIAGNOSIS — R001 Bradycardia, unspecified: Secondary | ICD-10-CM | POA: Insufficient documentation

## 2019-01-25 DIAGNOSIS — I10 Essential (primary) hypertension: Secondary | ICD-10-CM | POA: Insufficient documentation

## 2019-01-25 DIAGNOSIS — I428 Other cardiomyopathies: Secondary | ICD-10-CM | POA: Insufficient documentation

## 2019-02-04 ENCOUNTER — Telehealth: Payer: Self-pay | Admitting: *Deleted

## 2019-02-04 NOTE — Telephone Encounter (Signed)
Unable to leave a message mailbox is full. 

## 2019-02-09 ENCOUNTER — Other Ambulatory Visit: Payer: Self-pay | Admitting: Cardiology

## 2019-02-09 DIAGNOSIS — I5042 Chronic combined systolic (congestive) and diastolic (congestive) heart failure: Secondary | ICD-10-CM

## 2019-02-09 DIAGNOSIS — I10 Essential (primary) hypertension: Secondary | ICD-10-CM

## 2019-02-24 ENCOUNTER — Other Ambulatory Visit: Payer: Self-pay | Admitting: Cardiology

## 2019-03-21 DIAGNOSIS — R001 Bradycardia, unspecified: Secondary | ICD-10-CM | POA: Insufficient documentation

## 2019-03-21 DIAGNOSIS — Z7189 Other specified counseling: Secondary | ICD-10-CM | POA: Insufficient documentation

## 2019-03-21 NOTE — Progress Notes (Signed)
Cardiology Office Note   Date:  03/23/2019   ID:  Bryan Greet., DOB [birth year], MRN [id number]  PCP:  [name], MD  Cardiologist:   [name], MD   No chief complaint on file.     History of Present Illness: Bryan English. is a 54 y.o. male who was referred by himself for evaluation of chest pain.    He had had multiple ED visits for chest pain. He was not found to have any evidence for ischemia.  In a previous visit in August he had had negative CT for PE.  He did have coronary calcification in the LAD.   When I saw him I sent him for an echo.  He was found to have an EF of 20%.  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  We have titrated his meds.    His EF in Jan was slightly better with at 25 - 30%.  He has had bradycardia.   With med titration his EF has improved and was 50% earlier this year on echo.    He has been up in [place] taking care of his father.  He has not yet started spironolactone.  He has been feeling well.  He has not been doing aerobic exercise as much so he is gained a little weight. The patient denies any new symptoms such as chest discomfort, neck or arm discomfort. There has been no new shortness of breath, PND or orthopnea. There have been no reported palpitations, presyncope or syncope.  At the last visit I had started spironolactone but he had not yet started this.  Past Medical History:  Diagnosis Date  . Diabetes mellitus without complication ([place])   . Hypertension   . NICM (nonischemic cardiomyopathy) ([place]) 10/29/2017   EF 20% by echo, minimal CAD at cath    Past Surgical History:  Procedure Laterality Date  . LEFT HEART CATH AND CORONARY ANGIOGRAPHY N/A 10/29/2017   Procedure: LEFT HEART CATH AND CORONARY ANGIOGRAPHY;  Surgeon: [name], MD;  Location: [place] [facility];  Service: Cardiovascular;  Laterality: N/A;     Current Outpatient Medications  Medication Sig Dispense Refill  . albuterol (PROVENTIL  HFA;VENTOLIN HFA) 108 (90 Base) MCG/ACT inhaler Inhale 1-2 puffs into the lungs every 6 (six) hours as needed for wheezing or shortness of breath. 1 Inhaler 2  . Blood Glucose Monitoring Suppl (TRUE METRIX AIR GLUCOSE METER) w/Device KIT 1 kit by Does not apply route 2 (two) times daily. 1 kit 0  . clobetasol cream (TEMOVATE) 2.72 % Apply 1 application topically 2 (two) times daily. 60 g prn  . ENTRESTO 97-103 MG TAKE 1 TABLET BY MOUTH TWICE A DAY 60 tablet 5  . glucose blood (TRUE METRIX BLOOD GLUCOSE TEST) test strip Use as instructed to check blood sugars twice daily and as needed 100 each 3  . metFORMIN (GLUCOPHAGE) 1000 MG tablet Take 1 tablet (1,000 mg total) by mouth every evening. 90 tablet 1  . metoprolol (TOPROL-XL) 200 MG 24 hr tablet TAKE 1 TABLET (200 MG TOTAL) BY MOUTH DAILY. TAKE WITH OR IMMEDIATELY FOLLOWING A MEAL. 30 tablet 6  . pantoprazole (PROTONIX) 40 MG tablet Take 1 tablet (40 mg total) by mouth daily. 90 tablet 4  . rosuvastatin (CRESTOR) 10 MG tablet Take 1 tablet (10 mg total) by mouth daily. To lower cholesterol 30 tablet 11  . spironolactone (ALDACTONE) 25 MG tablet Take 1 tablet (25 mg total) by  mouth daily. 90 tablet 3  . TRUEPLUS LANCETS 28G MISC Use twice daily to check blood sugars and as needed 100 each 3   No current facility-administered medications for this visit.    Allergies:   Patient has no known allergies.    ROS:  Please see the history of present illness.   Otherwise, review of systems are positive for none.   All other systems are reviewed and negative.    PHYSICAL EXAM: VS:  BP (!) 173/96   Pulse 85   Temp 97.7 F (36.5 C)   Resp 16   Ht 5' 8.5" (1.74 m)   Wt 268 lb 9.6 oz (121.8 kg)   SpO2 97%   BMI 40.25 kg/m  , BMI Body mass index is 40.25 kg/m. GENERAL:  Well appearing NECK:  No jugular venous distention, waveform within normal limits, carotid upstroke brisk and symmetric, no bruits, no thyromegaly LUNGS:  Clear to auscultation  bilaterally CHEST:  Unremarkable HEART:  PMI not displaced or sustained,S1 and S2 within normal limits, no S3, no S4, no clicks, no rubs, no murmurs ABD:  Flat, positive bowel sounds normal in frequency in pitch, no bruits, no rebound, no guarding, no midline pulsatile mass, no hepatomegaly, no splenomegaly EXT:  2 plus pulses throughout, no edema, no cyanosis no clubbing   EKG:  EKG is  ordered today. Sinus rhythm, rate 87, axis within normal limits, intervals within normal limits, no acute ST-T wave changes.  Recent Labs: 04/30/2018: BUN 16; Creatinine, Ser 1.08; Potassium 4.8; Sodium 143 06/12/2018: Hemoglobin 14.0; Platelets 280    Lipid Panel    Component Value Date/Time   CHOL 218 (H) 06/12/2018 1456   TRIG 171 (H) 06/12/2018 1456   HDL 31 (L) 06/12/2018 1456   CHOLHDL 7.0 (H) 06/12/2018 1456   LDLCALC 153 (H) 06/12/2018 1456      Wt Readings from Last 3 Encounters:  03/23/19 268 lb 9.6 oz (121.8 kg)  10/15/18 255 lb 6.4 oz (115.8 kg)  10/09/18 253 lb 6.4 oz (114.9 kg)      Other studies Reviewed: Additional studies/ records that were reviewed today include: None Review of the above records demonstrates: See below  ASSESSMENT AND PLAN:   CARDIOMYOPATHY:    The patient's ejection fraction improved to 50%.  He has not yet started the spironolactone but he will start this now.  I will follow guidelines for spironolactone follow up in heart failure.  (Check potassium levels and  renal function 3--4 days and 1 week, then at least monthly for first 3 months and every 3 months thereafter after initiation of spironolactone.)  I will order an echocardiogram in January and will see him after that.  HTN:   This is treated in the context of managing his congestive heart failure.   COVID EDUCATION: We talked at length about the vaccine.  Current medicines are reviewed at length with the patient today.  The patient does not have concerns regarding medicines.  The following  changes have been made:  As above  Labs/ tests ordered today include:     No orders of the defined types were placed in this encounter.    Disposition:   FU with me in four months. Ronnell Guadalajara, MD  03/23/2019 5:00 PM    [place]

## 2019-03-23 ENCOUNTER — Other Ambulatory Visit: Payer: Self-pay

## 2019-03-23 ENCOUNTER — Encounter: Payer: Self-pay | Admitting: Cardiology

## 2019-03-23 ENCOUNTER — Ambulatory Visit (INDEPENDENT_AMBULATORY_CARE_PROVIDER_SITE_OTHER): Payer: PRIVATE HEALTH INSURANCE | Admitting: Cardiology

## 2019-03-23 VITALS — BP 173/96 | HR 85 | Temp 97.7°F | Resp 16 | Ht 68.5 in | Wt 268.6 lb

## 2019-03-23 DIAGNOSIS — Z7189 Other specified counseling: Secondary | ICD-10-CM | POA: Diagnosis not present

## 2019-03-23 DIAGNOSIS — I10 Essential (primary) hypertension: Secondary | ICD-10-CM | POA: Diagnosis not present

## 2019-03-23 DIAGNOSIS — I420 Dilated cardiomyopathy: Secondary | ICD-10-CM

## 2019-03-23 DIAGNOSIS — R001 Bradycardia, unspecified: Secondary | ICD-10-CM

## 2019-03-23 NOTE — Patient Instructions (Signed)
Medication Instructions:  BEGIN taking your Spironolactone 25mg  daily (1 tablet daily)  *If you need a refill on your cardiac medications before your next appointment, please call your pharmacy*   Lab Work: In one week- 03/30/19 Have BMET drawn  In one month have BMET drawn  If you have labs (blood work) drawn today and your tests are completely normal, you will receive your results only by: Marland Kitchen MyChart Message (if you have MyChart) OR . A paper copy in the mail If you have any lab test that is abnormal or we need to change your treatment, we will call you to review the results.   Follow-Up: At [facility], you and your health needs are our priority.  As part of our continuing mission to provide you with exceptional heart care, we have created designated Provider Care Teams.  These Care Teams include your primary Cardiologist (physician) and Advanced Practice Providers (APPs -  Physician Assistants and Nurse Practitioners) who all work together to provide you with the care you need, when you need it.  We recommend signing up for the patient portal called "MyChart".  Sign up information is provided on this After Visit Summary.  MyChart is used to connect with patients for Virtual Visits (Telemedicine).  Patients are able to view lab/test results, encounter notes, upcoming appointments, etc.  Non-urgent messages can be sent to your provider as well.   To learn more about what you can do with MyChart, go to [email or web address].    Your next appointment:   4 month(s)  The format for your next appointment:   In Person  Provider:   [name], MD

## 2019-03-25 ENCOUNTER — Other Ambulatory Visit: Payer: Self-pay

## 2019-03-25 DIAGNOSIS — I420 Dilated cardiomyopathy: Secondary | ICD-10-CM

## 2019-03-25 DIAGNOSIS — I10 Essential (primary) hypertension: Secondary | ICD-10-CM

## 2019-03-25 DIAGNOSIS — Z79899 Other long term (current) drug therapy: Secondary | ICD-10-CM

## 2019-03-25 DIAGNOSIS — R001 Bradycardia, unspecified: Secondary | ICD-10-CM

## 2019-03-25 DIAGNOSIS — Z7189 Other specified counseling: Secondary | ICD-10-CM

## 2019-08-15 NOTE — Progress Notes (Deleted)
Cardiology Office Note   Date:  08/15/2019   ID:  Bryan Greet., DOB 11-Oct-1965, MRN [id number]  PCP:  [name], MD  Cardiologist:   [name], MD   No chief complaint on file.     History of Present Illness: Bryan English. is a 54 y.o. male who was referred by himself for evaluation of chest pain.    He had had multiple ED visits for chest pain. He was not found to have any evidence for ischemia.  In a previous visit in August he had had negative CT for PE.  He did have coronary calcification in the LAD.   When I saw him I sent him for an echo.  He was found to have an EF of 20%.  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  We have titrated his meds.    His EF in Jan was slightly better with at 25 - 30%.  He has had bradycardia.   With med titration his EF has improved and was 50% earlier this year on echo.    At the last visit he started spironolactone.  ***  *** He has been up in [place] taking care of his father.  He has not yet started spironolactone.  He has been feeling well.  He has not been doing aerobic exercise as much so he is gained a little weight. The patient denies any new symptoms such as chest discomfort, neck or arm discomfort. There has been no new shortness of breath, PND or orthopnea. There have been no reported palpitations, presyncope or syncope.  At the last visit I had started spironolactone but he had not yet started this.  Past Medical History:  Diagnosis Date  . Diabetes mellitus without complication ([place])   . Hypertension   . NICM (nonischemic cardiomyopathy) ([place]) 10/29/2017   EF 20% by echo, minimal CAD at cath    Past Surgical History:  Procedure Laterality Date  . LEFT HEART CATH AND CORONARY ANGIOGRAPHY N/A 10/29/2017   Procedure: LEFT HEART CATH AND CORONARY ANGIOGRAPHY;  Surgeon: [name], MD;  Location: [facility];  Service: Cardiovascular;  Laterality: N/A;     Current Outpatient Medications    Medication Sig Dispense Refill  . albuterol (PROVENTIL HFA;VENTOLIN HFA) 108 (90 Base) MCG/ACT inhaler Inhale 1-2 puffs into the lungs every 6 (six) hours as needed for wheezing or shortness of breath. 1 Inhaler 2  . Blood Glucose Monitoring Suppl (TRUE METRIX AIR GLUCOSE METER) w/Device KIT 1 kit by Does not apply route 2 (two) times daily. 1 kit 0  . clobetasol cream (TEMOVATE) 4.70 % Apply 1 application topically 2 (two) times daily. 60 g prn  . ENTRESTO 97-103 MG TAKE 1 TABLET BY MOUTH TWICE A DAY 60 tablet 5  . glucose blood (TRUE METRIX BLOOD GLUCOSE TEST) test strip Use as instructed to check blood sugars twice daily and as needed 100 each 3  . metFORMIN (GLUCOPHAGE) 1000 MG tablet Take 1 tablet (1,000 mg total) by mouth every evening. 90 tablet 1  . metoprolol (TOPROL-XL) 200 MG 24 hr tablet TAKE 1 TABLET (200 MG TOTAL) BY MOUTH DAILY. TAKE WITH OR IMMEDIATELY FOLLOWING A MEAL. 30 tablet 6  . pantoprazole (PROTONIX) 40 MG tablet Take 1 tablet (40 mg total) by mouth daily. 90 tablet 4  . rosuvastatin (CRESTOR) 10 MG tablet Take 1 tablet (10 mg total) by mouth daily. To lower cholesterol 30 tablet 11  .  spironolactone (ALDACTONE) 25 MG tablet Take 1 tablet (25 mg total) by mouth daily. 90 tablet 3  . TRUEPLUS LANCETS 28G MISC Use twice daily to check blood sugars and as needed 100 each 3   No current facility-administered medications for this visit.    Allergies:   Patient has no known allergies.    ROS:  Please see the history of present illness.   Otherwise, review of systems are positive for ***.   All other systems are reviewed and negative.    PHYSICAL EXAM: VS:  There were no vitals taken for this visit. , BMI There is no height or weight on file to calculate BMI. GENERAL:  Well appearing NECK:  No jugular venous distention, waveform within normal limits, carotid upstroke brisk and symmetric, no bruits, no thyromegaly LUNGS:  Clear to auscultation bilaterally CHEST:   Unremarkable HEART:  PMI not displaced or sustained,S1 and S2 within normal limits, no S3, no S4, no clicks, no rubs, *** murmurs ABD:  Flat, positive bowel sounds normal in frequency in pitch, no bruits, no rebound, no guarding, no midline pulsatile mass, no hepatomegaly, no splenomegaly EXT:  2 plus pulses throughout, no edema, no cyanosis no clubbing    ***GENERAL:  Well appearing NECK:  No jugular venous distention, waveform within normal limits, carotid upstroke brisk and symmetric, no bruits, no thyromegaly LUNGS:  Clear to auscultation bilaterally CHEST:  Unremarkable HEART:  PMI not displaced or sustained,S1 and S2 within normal limits, no S3, no S4, no clicks, no rubs, no murmurs ABD:  Flat, positive bowel sounds normal in frequency in pitch, no bruits, no rebound, no guarding, no midline pulsatile mass, no hepatomegaly, no splenomegaly EXT:  2 plus pulses throughout, no edema, no cyanosis no clubbing   EKG:  EKG is *** ordered today. Sinus rhythm, rate ***, axis within normal limits, intervals within normal limits, no acute ST-T wave changes.  Recent Labs: No results found for requested labs within last 8760 hours.    Lipid Panel    Component Value Date/Time   CHOL 218 (H) 06/12/2018 1456   TRIG 171 (H) 06/12/2018 1456   HDL 31 (L) 06/12/2018 1456   CHOLHDL 7.0 (H) 06/12/2018 1456   LDLCALC 153 (H) 06/12/2018 1456      Wt Readings from Last 3 Encounters:  03/23/19 268 lb 9.6 oz (121.8 kg)  10/15/18 255 lb 6.4 oz (115.8 kg)  10/09/18 253 lb 6.4 oz (114.9 kg)      Other studies Reviewed: Additional studies/ records that were reviewed today include: *** Review of the above records demonstrates: ***  ASSESSMENT AND PLAN:   CARDIOMYOPATHY:    ***  The patient's ejection fraction improved to 50%.  He has not yet started the spironolactone but he will start this now.  I will follow guidelines for spironolactone follow up in heart failure.  (Check potassium levels  and  renal function 3--4 days and 1 week, then at least monthly for first 3 months and every 3 months thereafter after initiation of spironolactone.)  I will order an echocardiogram in January and will see him after that.  HTN:   This is ***treated in the context of managing his congestive heart failure.   COVID EDUCATION:   ***  We talked at length about the vaccine.  Current medicines are reviewed at length with the patient today.  The patient does not have concerns regarding medicines.  The following changes have been made:  ***  Labs/ tests ordered today  include:   ***  No orders of the defined types were placed in this encounter.    Disposition:   FU with me in *** months. Ronnell Guadalajara, MD  08/15/2019 7:03 PM    [place]

## 2019-08-16 ENCOUNTER — Telehealth: Payer: Self-pay

## 2019-08-16 ENCOUNTER — Ambulatory Visit: Payer: PRIVATE HEALTH INSURANCE | Admitting: Cardiology

## 2019-08-16 NOTE — Telephone Encounter (Signed)
Left a detailed message for the patient reminding him of his appointment with Dr. Percival Spanish and to give our office a call.

## 2019-08-18 ENCOUNTER — Other Ambulatory Visit: Payer: Self-pay | Admitting: Cardiology

## 2019-08-18 ENCOUNTER — Telehealth: Payer: Self-pay | Admitting: Cardiology

## 2019-08-18 DIAGNOSIS — I10 Essential (primary) hypertension: Secondary | ICD-10-CM

## 2019-08-18 DIAGNOSIS — I5042 Chronic combined systolic (congestive) and diastolic (congestive) heart failure: Secondary | ICD-10-CM

## 2019-08-18 MED ORDER — ENTRESTO 97-103 MG PO TABS: 1.0000 | ORAL_TABLET | Freq: Two times a day (BID) | ORAL | 6 refills | Status: DC

## 2019-08-18 NOTE — Telephone Encounter (Signed)
Sent refill for entresto to pharmacy.

## 2019-08-18 NOTE — Telephone Encounter (Signed)
Spoke to patient he stated he needs a refill for entresto.Refill sent to pharmacy.Advised to keep appointment already scheduled with Jory Sims DNP 9/10 at 3:45 pm.

## 2019-08-18 NOTE — Telephone Encounter (Signed)
Patient states he is returning a call from [facility] regarding Entresto medication.

## 2019-08-18 NOTE — Telephone Encounter (Signed)
°*  STAT* If patient is at the pharmacy, call can be transferred to refill team.   1. Which medications need to be refilled? (please list name of each medication and dose if known) ENTRESTO 97-103 MG  2. Which pharmacy/location (including street and city if local pharmacy) is medication to be sent to? [id number] [place] [place] [facility] [address]  3. Do they need a 30 day or 90 day supply? 30 day   Patient is out of medication

## 2019-08-19 ENCOUNTER — Telehealth: Payer: Self-pay | Admitting: Cardiology

## 2019-08-19 MED ORDER — ENTRESTO 97-103 MG PO TABS: 1.0000 | ORAL_TABLET | Freq: Two times a day (BID) | ORAL | 0 refills | Status: DC

## 2019-08-19 NOTE — Telephone Encounter (Signed)
Spoke with pt, a new script for 16 tablets sent to the preferred pharmacy.

## 2019-08-19 NOTE — Telephone Encounter (Signed)
[name] Key: [id number] Status Sent to Plan today Drug Entresto 97-103MG  tablets Form [employer] Electronic Prior Authorization Form ([employer]) 2017 NCPDP  Called and spoke with pt. Notified that PA was sent to plan today and that we do not have samples of 97-103mg  . Pt verbalized understanding he states that this is the 2nd time this has happened with his pharmacy.  Notified I would send this message to Dr.Hochrein and our pharmacists to review and advise. No other questions at this time.

## 2019-08-19 NOTE — Telephone Encounter (Signed)
It looks like the issue is that the PA is still pending.  The Key Judson Roch provided brings up the message "A pending PA request for the patient/medication combination exists."

## 2019-08-19 NOTE — Telephone Encounter (Signed)
Patient calling the office for samples of medication:   1.  What medication and dosage are you requesting samples for? sacubitril-valsartan (ENTRESTO) 97-103 MG  2.  Are you currently out of this medication? Almost out of medication, states his pharmacy hasn't received auth from [place] yet.

## 2019-08-19 NOTE — Telephone Encounter (Signed)
Hi,  I am not sure what the question or problem is.  Is he having problems getting this filled or is it a cost issue.

## 2019-08-19 NOTE — Telephone Encounter (Signed)
Follow up  Pt is calling to follow up. He said he will be out of town tomorrow. He is asking if he can get at least 8 day supply sample

## 2019-08-23 ENCOUNTER — Other Ambulatory Visit: Payer: Self-pay

## 2019-09-13 ENCOUNTER — Other Ambulatory Visit: Payer: Self-pay

## 2019-09-23 ENCOUNTER — Other Ambulatory Visit: Payer: Self-pay | Admitting: Cardiology

## 2019-09-23 DIAGNOSIS — I5042 Chronic combined systolic (congestive) and diastolic (congestive) heart failure: Secondary | ICD-10-CM

## 2019-09-23 DIAGNOSIS — I10 Essential (primary) hypertension: Secondary | ICD-10-CM

## 2019-09-23 MED ORDER — ENTRESTO 97-103 MG PO TABS: 1.0000 | ORAL_TABLET | Freq: Two times a day (BID) | ORAL | 6 refills | Status: DC

## 2019-09-23 NOTE — Progress Notes (Deleted)
Cardiology Clinic Note   Patient Name: Bryan English. Date of Encounter: 09/23/2019  Primary Care Provider:  [name], MD Primary Cardiologist:  [name], MD  Patient Profile    ***  Past Medical History    Past Medical History:  Diagnosis Date  . Diabetes mellitus without complication ([place])   . Hypertension   . NICM (nonischemic cardiomyopathy) ([place]) 10/29/2017   EF 20% by echo, minimal CAD at cath   Past Surgical History:  Procedure Laterality Date  . LEFT HEART CATH AND CORONARY ANGIOGRAPHY N/A 10/29/2017   Procedure: LEFT HEART CATH AND CORONARY ANGIOGRAPHY;  Surgeon: [name], MD;  Location: [facility];  Service: Cardiovascular;  Laterality: N/A;    Allergies  No Known Allergies  History of Present Illness    ***  Home Medications    Prior to Admission medications   Medication Sig Start Date End Date Taking? Authorizing Provider  albuterol (PROVENTIL HFA;VENTOLIN HFA) 108 (90 Base) MCG/ACT inhaler Inhale 1-2 puffs into the lungs every 6 (six) hours as needed for wheezing or shortness of breath. 10/23/17   Argentina Donovan, PA-C  Blood Glucose Monitoring Suppl (TRUE METRIX AIR GLUCOSE METER) w/Device KIT 1 kit by Does not apply route 2 (two) times daily. 12/02/17   [name], [name], MD  clobetasol cream (TEMOVATE) 4.09 % Apply 1 application topically 2 (two) times daily. 10/15/18   [name], [name], MD  glucose blood (TRUE METRIX BLOOD GLUCOSE TEST) test strip Use as instructed to check blood sugars twice daily and as needed 12/02/17   [name], [name], MD  metFORMIN (GLUCOPHAGE) 1000 MG tablet Take 1 tablet (1,000 mg total) by mouth every evening. 10/15/18   [name], [name], MD  metoprolol (TOPROL-XL) 200 MG 24 hr tablet TAKE 1 TABLET (200 MG TOTAL) BY MOUTH DAILY. TAKE WITH OR IMMEDIATELY FOLLOWING A MEAL. 02/24/19   [name], MD  pantoprazole (PROTONIX) 40 MG tablet Take 1 tablet (40 mg total) by mouth daily. 10/15/18   [name], [name], MD   rosuvastatin (CRESTOR) 10 MG tablet Take 1 tablet (10 mg total) by mouth daily. To lower cholesterol 06/14/18   [name], [name], MD  sacubitril-valsartan (ENTRESTO) 97-103 MG Take 1 tablet by mouth 2 (two) times daily. 08/18/19   [name], MD  sacubitril-valsartan (ENTRESTO) 97-103 MG Take 1 tablet by mouth 2 (two) times daily. 08/19/19   [name], MD  spironolactone (ALDACTONE) 25 MG tablet Take 1 tablet (25 mg total) by mouth daily. 10/09/18 03/23/19  [name], MD  TRUEPLUS LANCETS 28G MISC Use twice daily to check blood sugars and as needed 12/02/17   [name], MD    Family History    Family History  Adopted: Yes  Family history unknown: Yes   is adopted.   Social History    Social History   Socioeconomic History  . Marital status: Married    Spouse name: Not on file  . Number of children: Not on file  . Years of education: Not on file  . Highest education level: Not on file  Occupational History  . Not on file  Tobacco Use  . Smoking status: Never Smoker  . Smokeless tobacco: Never Used  Vaping Use  . Vaping Use: Never used  Substance and Sexual Activity  . Alcohol use: Not Currently  . Drug use: Never  . Sexual activity: Not on file  Other Topics Concern  . Not on file  Social History Narrative   Married.  Lives  with wife.  Two children natural.      Social Determinants of Health   Financial Resource Strain:   . Difficulty of Paying Living Expenses: Not on file  Food Insecurity:   . Worried About Charity fundraiser in the Last Year: Not on file  . Ran Out of Food in the Last Year: Not on file  Transportation Needs:   . Lack of Transportation (Medical): Not on file  . Lack of Transportation (Non-Medical): Not on file  Physical Activity:   . Days of Exercise per Week: Not on file  . Minutes of Exercise per Session: Not on file  Stress:   . Feeling of Stress : Not on file  Social Connections:   . Frequency of Communication with Friends and  Family: Not on file  . Frequency of Social Gatherings with Friends and Family: Not on file  . Attends Religious Services: Not on file  . Active Member of Clubs or Organizations: Not on file  . Attends Archivist Meetings: Not on file  . Marital Status: Not on file  Intimate Partner Violence:   . Fear of Current or Ex-Partner: Not on file  . Emotionally Abused: Not on file  . Physically Abused: Not on file  . Sexually Abused: Not on file     Review of Systems    General:  No chills, fever, night sweats or weight changes.  Cardiovascular:  No chest pain, dyspnea on exertion, edema, orthopnea, palpitations, paroxysmal nocturnal dyspnea. Dermatological: No rash, lesions/masses Respiratory: No cough, dyspnea Urologic: No hematuria, dysuria Abdominal:   No nausea, vomiting, diarrhea, bright red blood per rectum, melena, or hematemesis Neurologic:  No visual changes, wkns, changes in mental status. All other systems reviewed and are otherwise negative except as noted above.  Physical Exam    VS:  There were no vitals taken for this visit. , BMI There is no height or weight on file to calculate BMI. GEN: Well nourished, well developed, in no acute distress. HEENT: normal. Neck: Supple, no JVD, carotid bruits, or masses. Cardiac: RRR, no murmurs, rubs, or gallops. No clubbing, cyanosis, edema.  Radials/DP/PT 2+ and equal bilaterally.  Respiratory:  Respirations regular and unlabored, clear to auscultation bilaterally. GI: Soft, nontender, nondistended, BS + x 4. MS: no deformity or atrophy. Skin: warm and dry, no rash. Neuro:  Strength and sensation are intact. Psych: Normal affect.  Accessory Clinical Findings    Recent Labs: No results found for requested labs within last 8760 hours.   Recent Lipid Panel    Component Value Date/Time   CHOL 218 (H) 06/12/2018 1456   TRIG 171 (H) 06/12/2018 1456   HDL 31 (L) 06/12/2018 1456   CHOLHDL 7.0 (H) 06/12/2018 1456    LDLCALC 153 (H) 06/12/2018 1456    ECG personally reviewed by me today- *** - No acute changes  Assessment & Plan   1.  ***   Jossie Ng. Aalina Brege NP-C    09/23/2019, 7:13 AM [address] Office [phone] Fax [phone]  Notice: This dictation was prepared with Dragon dictation along with smaller phrase technology. Any transcriptional errors that result from this process are unintentional and may not be corrected upon review.

## 2019-09-23 NOTE — Telephone Encounter (Signed)
*  STAT* If patient is at the pharmacy, call can be transferred to refill team.   1. Which medications need to be refilled? (please list name of each medication and dose if known) sacubitril-valsartan (ENTRESTO) 97-103 MG  2. Which pharmacy/location (including street and city if local pharmacy) is medication to be sent to? [id number] [place] [place] [facility] [address] [place]  3. Do they need a 30 day or 90 day supply? 5 day supply

## 2019-09-24 ENCOUNTER — Telehealth: Payer: Self-pay

## 2019-09-24 ENCOUNTER — Other Ambulatory Visit: Payer: Self-pay | Admitting: Pharmacist Clinician (PhC)/ Clinical Pharmacy Specialist

## 2019-09-24 ENCOUNTER — Ambulatory Visit: Payer: PRIVATE HEALTH INSURANCE | Admitting: General Practice

## 2019-09-24 DIAGNOSIS — I10 Essential (primary) hypertension: Secondary | ICD-10-CM

## 2019-09-24 DIAGNOSIS — I5042 Chronic combined systolic (congestive) and diastolic (congestive) heart failure: Secondary | ICD-10-CM

## 2019-09-24 MED ORDER — ENTRESTO 97-103 MG PO TABS: 1.0000 | ORAL_TABLET | Freq: Two times a day (BID) | ORAL | 6 refills | Status: DC

## 2019-09-24 NOTE — Telephone Encounter (Signed)
Our office [facility]) received a fax stating that this pt needs PA for [facility]. After reviewing the chart and seeing that one had already been submitted on Covermymeds, I called [employer] to check the status. They stated that they requested additional information but never received it so the case was closed. I started a new case on Covermymeds, all questions were answered and it was sent to [place] for determination.   KEY: [place]: [id number]

## 2019-09-27 ENCOUNTER — Other Ambulatory Visit: Payer: Self-pay

## 2019-09-27 ENCOUNTER — Telehealth: Payer: Self-pay

## 2019-09-27 NOTE — Telephone Encounter (Signed)
Advised patient

## 2019-09-27 NOTE — Telephone Encounter (Signed)
June, Sarah O, RN at 09/27/2019 2:17 PM  Status: Signed    [name] Key: [id number] - PA Case ID: [id number] Outcome Approved today Approved. Approved for brand ENTRESTO Tablet, quantity up to 180 per 90 days, under the pharmacy benefit. The drug has been approved from 09/27/2019 to 03/26/2020. Drug Entresto 97-103MG  tablets Form P[employer] consultant Prior Authorization Form ([employer]) 2017 NCPDP

## 2019-09-27 NOTE — Telephone Encounter (Signed)
[name] Key: [id number] - PA Case ID: [id number] Outcome Approved today Approved. Approved for brand ENTRESTO Tablet, quantity up to 180 per 90 days, under the pharmacy benefit. The drug has been approved from 09/27/2019 to 03/26/2020. Drug Entresto 97-103MG  tablets Form Pensions consultant Prior Authorization Form ([employer]) 2017 NCPDP

## 2019-09-29 ENCOUNTER — Other Ambulatory Visit: Payer: Self-pay | Admitting: Cardiology

## 2019-09-29 NOTE — Telephone Encounter (Signed)
°*  STAT* If patient is at the pharmacy, call can be transferred to refill team.   1. Which medications need to be refilled? (please list name of each medication and dose if known) metoprolol (TOPROL-XL) 200 MG 24 hr tablet  2. Which pharmacy/location (including street and city if local pharmacy) is medication to be sent to? [facility] [place]. Phone number: [phone]   3. Do they need a 30 day or 90 day supply? 5 day supply

## 2019-09-30 ENCOUNTER — Other Ambulatory Visit: Payer: Self-pay | Admitting: Cardiology

## 2019-09-30 NOTE — Telephone Encounter (Signed)
Patient's wife is following up regarding refill request.

## 2019-10-01 ENCOUNTER — Other Ambulatory Visit: Payer: Self-pay

## 2019-10-01 MED ORDER — METOPROLOL SUCCINATE ER 200 MG PO TB24: 200.0000 mg | ORAL_TABLET | Freq: Every day | ORAL | 6 refills | Status: DC

## 2019-10-01 NOTE — Addendum Note (Signed)
Addended by: Hinton Dyer on: 10/01/2019 09:31 AM   Modules accepted: Orders

## 2019-10-01 NOTE — Telephone Encounter (Signed)
Follow up    Wife is calling to follow up on refill request.  Patient is out of medication.  Please call when metoprolol is refilled.  Patient is out of town.

## 2019-10-01 NOTE — Telephone Encounter (Signed)
Medication sent to pharmacy  

## 2019-10-13 IMAGING — CT CT ANGIO CHEST
2 of 8 series · 18 of 36 positions shown · IV contrast (iopamidol)
Comparison: Chest x-ray earlier today

CLINICAL DATA: Positive D-dimer.  Wheezing.

EXAM:
CT ANGIOGRAPHY CHEST WITH CONTRAST
TECHNIQUE: Multidetector CT imaging of the chest was performed using the
standard protocol during bolus administration of intravenous
contrast. Multiplanar CT image reconstructions and MIPs were
obtained to evaluate the vascular anatomy.
CONTRAST:  100mL GPQY2U-EAO IOPAMIDOL (GPQY2U-EAO) INJECTION 76%

[Series 6: pe thins · axial · 0.94mm/px · z∈[+921,+1242]mm · 17 of 476 slices shown]
[im 24/476  lung]
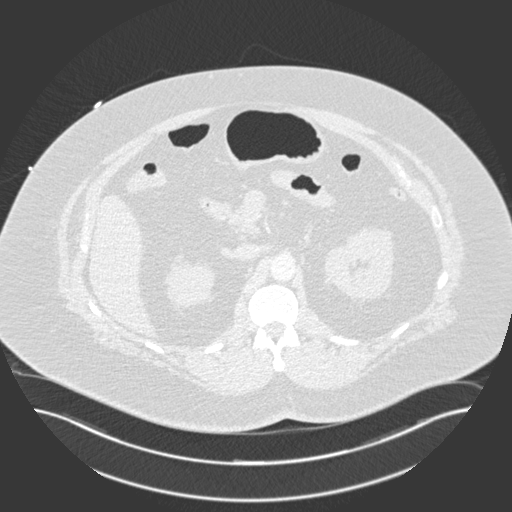
[im 48/476  mediastinal]
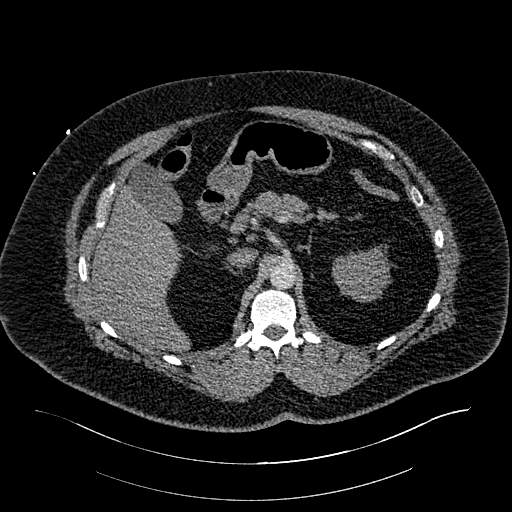
[im 72/476  lung]
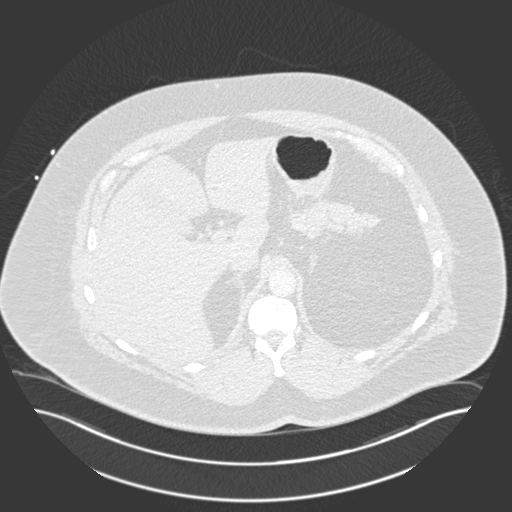
[im 96/476  mediastinal]
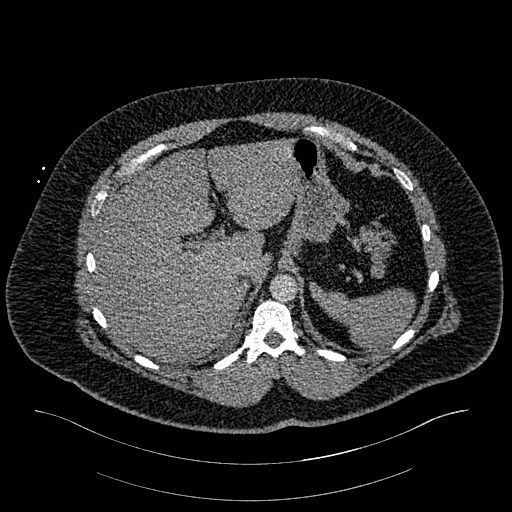
[im 143/476  lung]
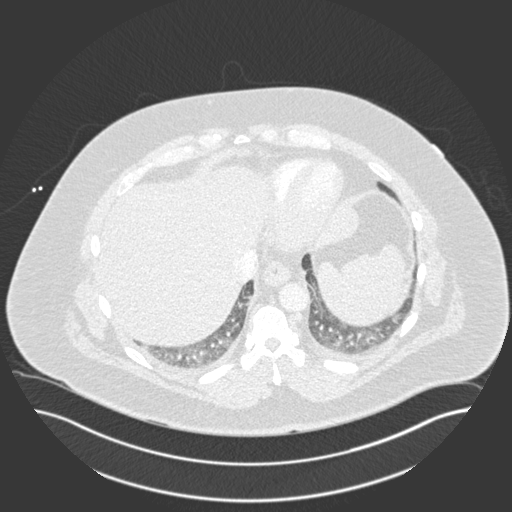
[im 167/476  mediastinal]
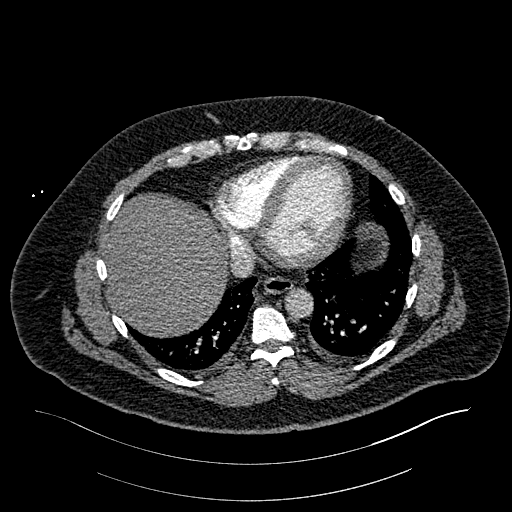
[im 191/476  lung]
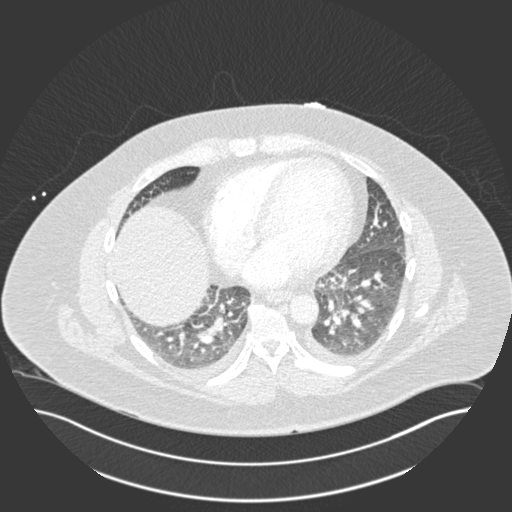
[im 214/476  mediastinal]
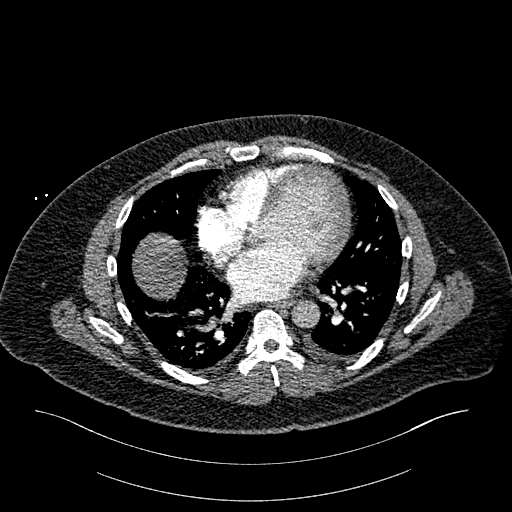
[im 238/476  lung]
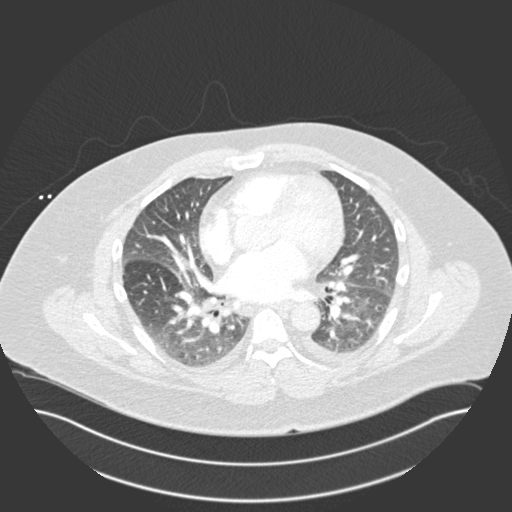
[im 262/476  mediastinal]
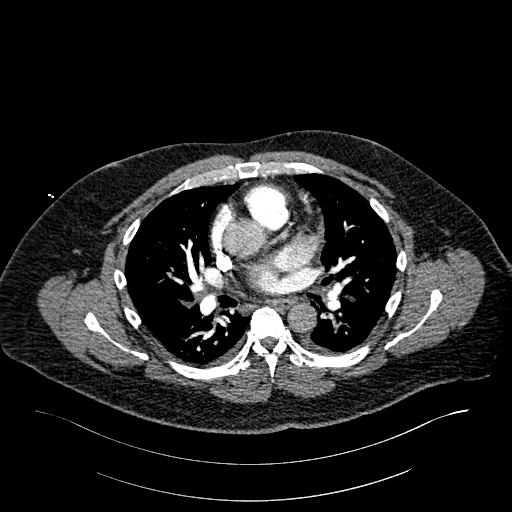
[im 286/476  lung]
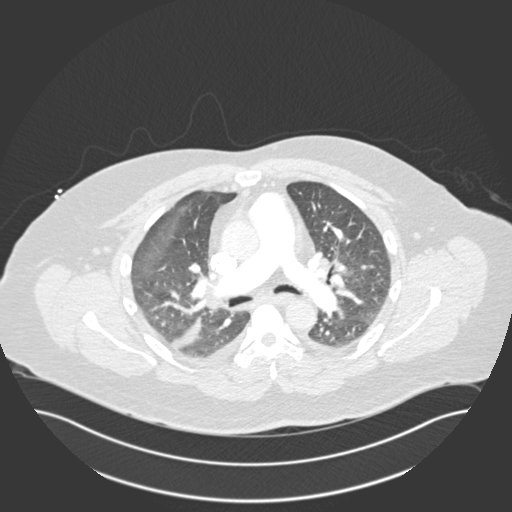
[im 309/476  mediastinal]
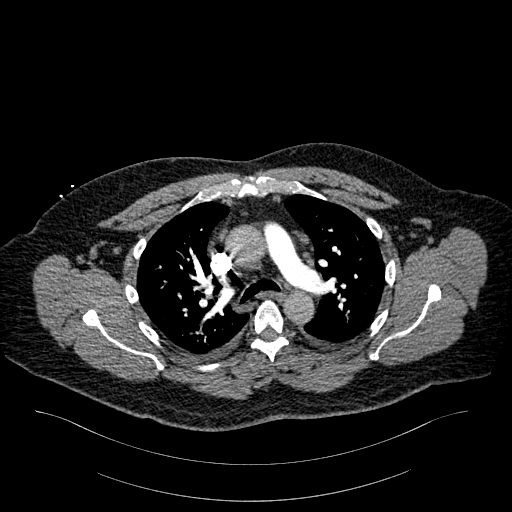
[im 333/476  lung]
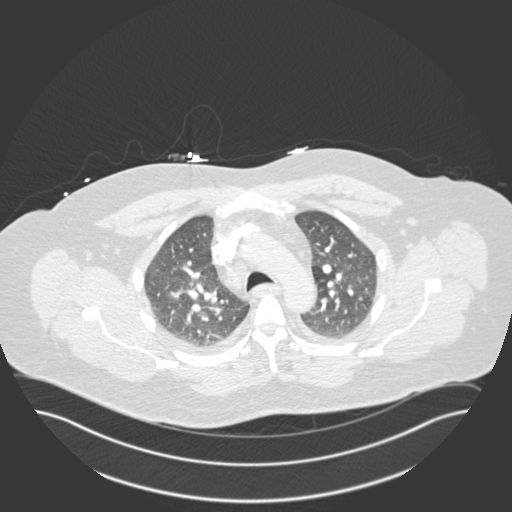
[im 381/476  mediastinal]
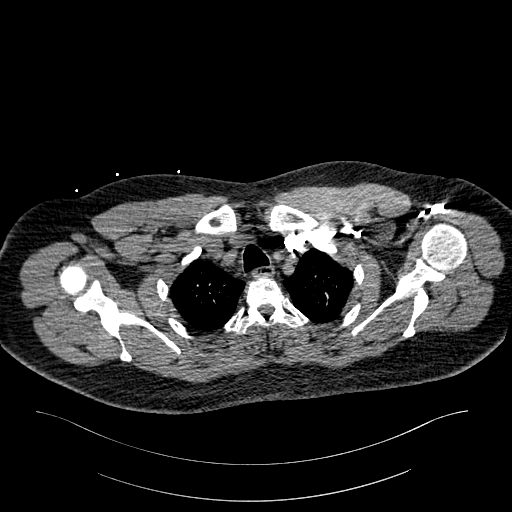
[im 404/476  lung]
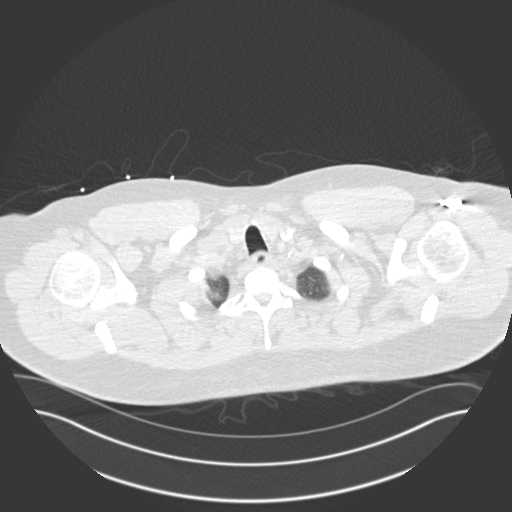
[im 428/476  mediastinal]
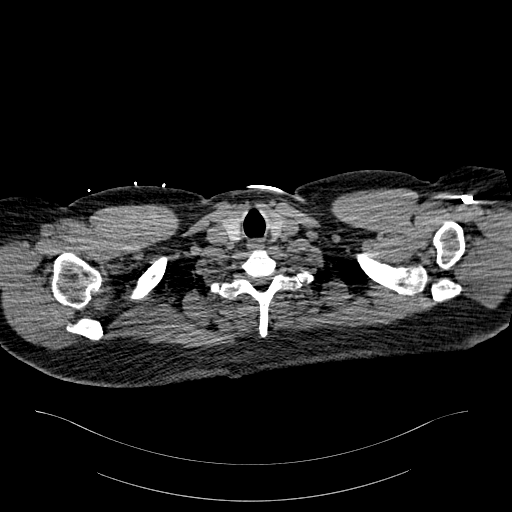
[im 452/476  lung]
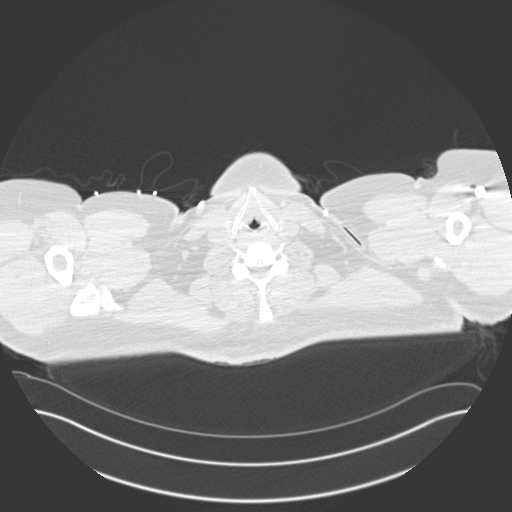

[Series 7: pe coronal mpr · coronal · 0.72mm/px · 1 of 119 slices shown]
[im 60/119  mediastinal]
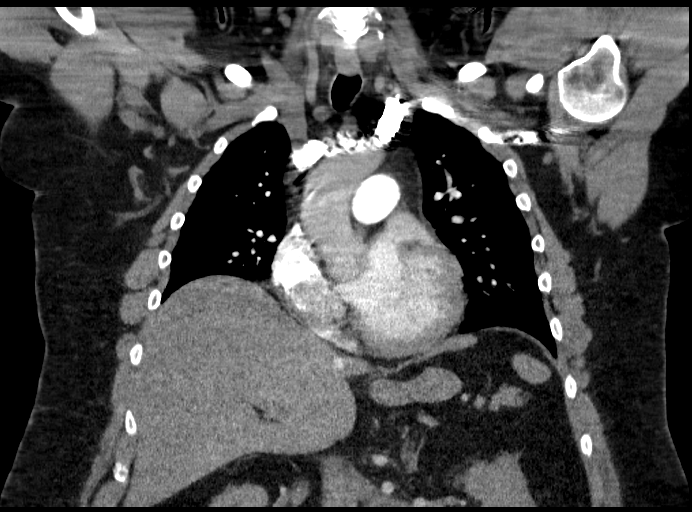

[18 of 36 positions shown; findings below may reference images not displayed]

FINDINGS: Cardiovascular: No filling defects in the pulmonary arteries to
suggest pulmonary emboli. Insert Heart scattered calcifications in
the left anterior descending coronary artery.

Mediastinum/Nodes: No mediastinal, hilar, or axillary adenopathy.
Scattered reactive mediastinal lymph nodes.

Lungs/Pleura: Trace bilateral pleural effusions. Mild interstitial
prominence and peribronchial thickening may reflect bronchitis.
Ground-glass nodular opacity in the superior segment of the left
lower lobe measures 15 mm. Favor infectious/inflammatory. This
warrants follow-up.

Upper Abdomen: Mild diffuse fatty infiltration of the liver. No
acute findings.

Musculoskeletal: Chest wall soft tissues are unremarkable. No acute
bony abnormality.

Review of the MIP images confirms the above findings.
IMPRESSION: No evidence of pulmonary embolus.

Interstitial prominence and peribronchial thickening throughout the
lungs which could reflect bronchitis or asthma.

15 mm ground-glass nodular opacity in the superior segment of the
left lower lobe. Initial follow-up with CT at 6-12 months is
recommended to confirm persistence. If persistent, repeat CT is
recommended every 2 years until 5 years of stability has been
established. This recommendation follows the consensus statement:
Guidelines for Management of Incidental Pulmonary Nodules Detected
[DATE].

Trace bilateral pleural effusions.

## 2019-10-19 NOTE — Progress Notes (Signed)
Cardiology Office Note   Date:  10/20/2019   ID:  Bryan Greet., DOB 12-15-1965, MRN [id number]  PCP:  Bryan Blackbird, MD Cardiologist:  Bryan Breeding, MD 03/23/2019 Electrphysiologist: None Bryan Ferries, PA-C   History of Present Illness: Bryan Gargis. is a 54 y.o. male with a history of CP w/ mild CAD at cath, -PE on CT, EF 20% >> 25-30%>> 50%, bradycardia  03/09 office visit, pt to start spiro, repeat echo in Jan 2022, wt 268  Bryan Greet. presents for cardiology follow up.  He has gained about 20 lbs over the last 6 months. He was up [place] taking care of his father in [place]. Also has problems w/ excess intake at social events.   He is back home now, is determined to get his weight back down. He had lost down to 250 lbs before he went up [place]. He is modifying his eating and exercising regularly. He is walking and going to the gym.   He has not had any problems breathing.   Since he is back to walking a mile a day and going to the gym, he has not had CP or SOB.   No orthopnea or PND, no LE edema.   He never started the spiro  No CP  No palpitations, no presyncope or syncope.   His BP is never that high at home. He does not know why it is so high here. SBP at home is usually in the 110s.  PCP follows his labs.  He is retired from [employer], his sons took it over. COVID status: un-vaccinated, did not have COVID, he was told to ask his cardiologist if he could have the vaccine. Yes! Past Medical History:  Diagnosis Date  . Diabetes mellitus without complication ([place])   . Hypertension   . NICM (nonischemic cardiomyopathy) ([place]) 10/29/2017   EF 20% by echo, minimal CAD at cath    Past Surgical History:  Procedure Laterality Date  . LEFT HEART CATH AND CORONARY ANGIOGRAPHY N/A 10/29/2017   Procedure: LEFT HEART CATH AND CORONARY ANGIOGRAPHY;  Surgeon: [name], MD;  Location: [facility];  Service:  Cardiovascular;  Laterality: N/A;    Current Outpatient Medications  Medication Sig Dispense Refill  . Blood Glucose Monitoring Suppl (TRUE METRIX AIR GLUCOSE METER) w/Device KIT 1 kit by Does not apply route 2 (two) times daily. 1 kit 0  . clobetasol cream (TEMOVATE) 8.18 % Apply 1 application topically 2 (two) times daily. 60 g prn  . glucose blood (TRUE METRIX BLOOD GLUCOSE TEST) test strip Use as instructed to check blood sugars twice daily and as needed 100 each 3  . metFORMIN (GLUCOPHAGE) 1000 MG tablet Take 1 tablet (1,000 mg total) by mouth every evening. 90 tablet 1  . metoprolol (TOPROL-XL) 200 MG 24 hr tablet Take 1 tablet (200 mg total) by mouth daily. Take with or immediately following a meal. 30 tablet 6  . pantoprazole (PROTONIX) 40 MG tablet Take 1 tablet (40 mg total) by mouth daily. 90 tablet 4  . sacubitril-valsartan (ENTRESTO) 97-103 MG Take 1 tablet by mouth 2 (two) times daily. 60 tablet 6  . TRUEPLUS LANCETS 28G MISC Use twice daily to check blood sugars and as needed 100 each 3  . spironolactone (ALDACTONE) 25 MG tablet Take 1 tablet (25 mg total) by mouth daily. 90 tablet 3   No current facility-administered medications for this visit.    Allergies:  Patient has no known allergies.    Social History:  The patient  reports that he has never smoked. He has never used smokeless tobacco. He reports previous alcohol use. He reports that he does not use drugs.   Family History:  The patient's He was adopted. Family history is unknown by patient.  is adopted.   ROS:  Please see the history of present illness. All other systems are reviewed and negative.   PHYSICAL EXAM:  134/80 on recheck VS:  BP (!) 158/92   Pulse 75   Ht _0  (1.727 m)   Wt 273 lb 6.4 oz (124 kg)   SpO2 98%   BMI 41.57 kg/m  , BMI Body mass index is 41.57 kg/m. GEN: Well nourished, well developed, male in no acute distress HEENT: normal for age  Neck: no JVD, no carotid bruit, no  masses Cardiac: RRR; no murmur, no rubs, or gallops Respiratory:  clear to auscultation bilaterally, normal work of breathing GI: soft, nontender, nondistended, + BS MS: no deformity or atrophy; no edema; distal pulses are 2+ in all 4 extremities  Skin: warm and dry, no rash Neuro:  Strength and sensation are intact Psych: euthymic mood, full affect   EKG:  EKG is not ordered today.   ECHO: 01/25/2019 1. Left ventricular ejection fraction, by visual estimation, is 50 to  55%. The left ventricle has low normal function. There is mildly increased  left ventricular hypertrophy.  2. Left ventricular ejection fraction by 3D volume is is 53 %.  3. The average left ventricular global longitudinal strain is -15.9 %.  4. The left ventricle demonstrates global hypokinesis.  5. Global right ventricle has normal systolic function.The right  ventricular size is normal. No increase in right ventricular wall  thickness.  6. Left atrial size was normal.  7. The mitral valve is grossly normal. No evidence of mitral valve  regurgitation.  8. The tricuspid valve is grossly normal.  9. The aortic valve is tricuspid. Aortic valve regurgitation is not  visualized. No evidence of aortic valve sclerosis or stenosis.  10. TR signal is inadequate for assessing pulmonary artery systolic  pressure.  11. The inferior vena cava is normal in size with greater than 50%  respiratory variability, suggesting right atrial pressure of 3 mmHg.  12. Left ventricular diastolic parameters are consistent with Grade I  diastolic dysfunction (impaired relaxation).   In comparison to the previous echocardiogram(s): EF has improved to 50-55%  compared with 25-30% (01/19/2018).   CATH: 10/29/2017  Prox RCA lesion is 30% stenosed.   No significant coronary obstructive disease with smooth 25 -30% proximal RCA narrowing and a normal left coronary circulation.  LVEDP 17 mmHg.  Previous echo Doppler documented EF  of 20 to 25%.  RECOMMENDATION: Guideline directed medical therapy for  nonischemic cardiomyopathy.  The patient will follow-up with Dr. Percival Spanish for further evaluation and treatment. Recommend Aspirin 54m daily for moderate CAD.    Recent Labs: No results found for requested labs within last 8760 hours.  CBC    Component Value Date/Time   WBC 10.0 06/12/2018 1456   WBC 9.1 11/09/2017 0502   RBC 5.44 06/12/2018 1456   RBC 5.71 11/09/2017 0502   HGB 14.0 06/12/2018 1456   HCT 43.3 06/12/2018 1456   PLT 280 06/12/2018 1456   MCV 80 06/12/2018 1456   MCH 25.7 (L) 06/12/2018 1456   MCH 24.5 (L) 11/09/2017 0502   MCHC 32.3 06/12/2018 1456   MCHC 31.6  11/09/2017 0502   RDW 14.1 06/12/2018 1456   LYMPHSABS 3.3 (H) 06/12/2018 1456   MONOABS 0.7 11/09/2017 0502   EOSABS 0.2 06/12/2018 1456   BASOSABS 0.0 06/12/2018 1456   CMP Latest Ref Rng & Units 04/30/2018 11/24/2017 11/09/2017  Glucose 65 - 99 mg/dL 124(H) - 163(H)  BUN 6 - 24 mg/dL 16 - 15  Creatinine 0.76 - 1.27 mg/dL 1.08 - 1.06  Sodium 134 - 144 mmol/L 143 - 135  Potassium 3.5 - 5.2 mmol/L 4.8 - 3.6  Chloride 96 - 106 mmol/L 103 - 101  CO2 20 - 29 mmol/L 22 - 20(L)  Calcium 8.7 - 10.2 mg/dL 9.7 - 9.2  Total Protein 6.0 - 8.5 g/dL - 7.5 -  Total Bilirubin 0.0 - 1.2 mg/dL - 0.3 -  Alkaline Phos 39 - 117 IU/L - 85 -  AST 0 - 40 IU/L - 22 -  ALT 0 - 44 IU/L - 26 -     Lipid Panel Lab Results  Component Value Date   CHOL 218 (H) 06/12/2018   HDL 31 (L) 06/12/2018   LDLCALC 153 (H) 06/12/2018   TRIG 171 (H) 06/12/2018   CHOLHDL 7.0 (H) 06/12/2018      Wt Readings from Last 3 Encounters:  10/20/19 273 lb 6.4 oz (124 kg)  03/23/19 268 lb 9.6 oz (121.8 kg)  10/15/18 255 lb 6.4 oz (115.8 kg)     Other studies Reviewed: Additional studies/ records that were reviewed today include: Office notes, hospital records and testing.  ASSESSMENT AND PLAN:  1.  NICM: -His EF had improved from 25% up to 50% at last  check. -He should continue Toprol-XL 200 mg daily and Entresto 97-103 mg twice daily. -He is not currently on a diuretic and does not require 1. -According to him, his blood pressure is borderline low at times at home.  At this time, he does not wish to start the spironolactone.  As he has no significant volume overload on exam and he is working hard to get healthier, it is okay to hold off.  -Recheck echo prior to next office visit  2.  Hyperlipidemia, goal LDL less than 70: -Although he had nonobstructive disease at his cath, he is starting to build plaque.  Therefore, his goal LDL is less than 70. -He was initially prescribed Crestor 10 mg in 2020. -CP was following his labs, but they have not been checked in over a year -Check lipids at his next office visit if his PCP has not done so  3.  Hypertension: -According to [name], his systolic blood pressure runs about 110 at home although sometimes higher. -He is encouraged to keep a blood pressure diary and report if he is above target. -She is on max dose of beta-blocker and Entresto -If he needs anything else, that would be the spironolactone   Current medicines are reviewed at length with the patient today.  The patient does not have concerns regarding medicines.  The following changes have been made:  no change for now  Labs/ tests ordered today include:  No orders of the defined types were placed in this encounter.    Disposition:   FU with Bryan Breeding, MD  Signed, Bryan Ferries, PA-C  10/20/2019 2:05 PM    [facility] Phone: [phone]; Fax: [phone]

## 2019-10-20 ENCOUNTER — Other Ambulatory Visit: Payer: Self-pay

## 2019-10-20 ENCOUNTER — Encounter: Payer: Self-pay | Admitting: Physician Assistant

## 2019-10-20 ENCOUNTER — Ambulatory Visit (INDEPENDENT_AMBULATORY_CARE_PROVIDER_SITE_OTHER): Payer: PRIVATE HEALTH INSURANCE | Admitting: Physician Assistant

## 2019-10-20 VITALS — BP 158/92 | HR 75 | Ht 68.0 in | Wt 273.4 lb

## 2019-10-20 DIAGNOSIS — I10 Essential (primary) hypertension: Secondary | ICD-10-CM

## 2019-10-20 DIAGNOSIS — I428 Other cardiomyopathies: Secondary | ICD-10-CM | POA: Diagnosis not present

## 2019-10-20 DIAGNOSIS — E785 Hyperlipidemia, unspecified: Secondary | ICD-10-CM

## 2019-10-20 NOTE — Patient Instructions (Addendum)
Medication Instructions:  Your physician recommends that you continue on your current medications as directed. Please refer to the Current Medication list given to you today.  *If you need a refill on your cardiac medications before your next appointment, please call your pharmacy*  Lab Work: NONE ordered at this time of appointment   If you have labs (blood work) drawn today and your tests are completely normal, you will receive your results only by:  [facility] (if you have MyChart) OR  A paper copy in the mail If you have any lab test that is abnormal or we need to change your treatment, we will call you to review the results.  Testing/Procedures: Your physician has requested that you have an echocardiogram. Echocardiography is a painless test that uses sound waves to create images of your heart. It provides your doctor with information about the size and shape of your heart and how well your hearts chambers and valves are working. This procedure takes approximately one hour. There are no restrictions for this procedure.   PLEASE SCHEDULE FOR MARCH 2022   Follow-Up: At [facility], you and your health needs are our priority.  As part of our continuing mission to provide you with exceptional heart care, we have created designated Provider Care Teams.  These Care Teams include your primary Cardiologist (physician) and Advanced Practice Providers (APPs -  Physician Assistants and Nurse Practitioners) who all work together to provide you with the care you need, when you need it.  Your next appointment:   6 month(s)  The format for your next appointment:   In Person  Provider:   [name], MD  Other Instructions

## 2019-11-08 ENCOUNTER — Other Ambulatory Visit: Payer: Self-pay | Admitting: Family Medicine

## 2019-11-08 DIAGNOSIS — K219 Gastro-esophageal reflux disease without esophagitis: Secondary | ICD-10-CM

## 2019-11-08 NOTE — Telephone Encounter (Signed)
Requested medication (s) are due for refill today: yes  Requested medication (s) are on the active medication list: yes  Last refill:  10/15/18  Future visit scheduled: no  Notes to clinic:  needs appt- called pt and LM on VM to call office to schedule appt   Requested Prescriptions  Pending Prescriptions Disp Refills   pantoprazole (PROTONIX) 40 MG tablet [Pharmacy Med Name: PANTOPRAZOLE SOD DR 40 MG T 40 Tablet] 30 tablet 0    Sig: TAKE 1 TABLET (40 MG TOTAL) BY MOUTH DAILY.      Gastroenterology: Proton Pump Inhibitors Failed - 11/08/2019  1:29 PM      Failed - Valid encounter within last 12 months    Recent Outpatient Visits           1 year ago Type 2 diabetes mellitus with hyperglycemia, without long-term current use of insulin ([place] [facility])   [name], [place], MD   1 year ago Essential hypertension   [place], [place] C[place], NP   1 year ago Type 2 diabetes mellitus with hyperglycemia, without long-term current use of insulin ([place])   [place], MD   2 years ago Type 2 diabetes mellitus with hyperglycemia, without long-term current use of insulin [facility])   [place], [place], [place]

## 2020-03-15 ENCOUNTER — Ambulatory Visit (HOSPITAL_COMMUNITY): Payer: 59 | Attending: Cardiology

## 2020-03-15 ENCOUNTER — Other Ambulatory Visit: Payer: Self-pay

## 2020-03-15 DIAGNOSIS — I428 Other cardiomyopathies: Secondary | ICD-10-CM

## 2020-03-15 LAB — ECHOCARDIOGRAM COMPLETE
Area-P 1/2: 4.6 cm2
S' Lateral: 4 cm

## 2020-03-27 ENCOUNTER — Ambulatory Visit (INDEPENDENT_AMBULATORY_CARE_PROVIDER_SITE_OTHER): Payer: 59 | Admitting: Family Medicine

## 2020-03-27 ENCOUNTER — Encounter: Payer: Self-pay | Admitting: Family Medicine

## 2020-03-27 ENCOUNTER — Other Ambulatory Visit: Payer: Self-pay

## 2020-03-27 VITALS — BP 152/86 | HR 82 | Temp 97.8°F | Ht 68.75 in | Wt 268.6 lb

## 2020-03-27 DIAGNOSIS — K219 Gastro-esophageal reflux disease without esophagitis: Secondary | ICD-10-CM

## 2020-03-27 DIAGNOSIS — M5416 Radiculopathy, lumbar region: Secondary | ICD-10-CM

## 2020-03-27 DIAGNOSIS — M25511 Pain in right shoulder: Secondary | ICD-10-CM

## 2020-03-27 DIAGNOSIS — E1165 Type 2 diabetes mellitus with hyperglycemia: Secondary | ICD-10-CM

## 2020-03-27 DIAGNOSIS — I428 Other cardiomyopathies: Secondary | ICD-10-CM | POA: Diagnosis not present

## 2020-03-27 DIAGNOSIS — I10 Essential (primary) hypertension: Secondary | ICD-10-CM

## 2020-03-27 DIAGNOSIS — Z6838 Body mass index (BMI) 38.0-38.9, adult: Secondary | ICD-10-CM

## 2020-03-27 DIAGNOSIS — E785 Hyperlipidemia, unspecified: Secondary | ICD-10-CM | POA: Insufficient documentation

## 2020-03-27 DIAGNOSIS — E1169 Type 2 diabetes mellitus with other specified complication: Secondary | ICD-10-CM | POA: Diagnosis not present

## 2020-03-27 DIAGNOSIS — L409 Psoriasis, unspecified: Secondary | ICD-10-CM

## 2020-03-27 MED ORDER — PANTOPRAZOLE SODIUM 40 MG PO TBEC: 40.0000 mg | DELAYED_RELEASE_TABLET | Freq: Every day | ORAL | 4 refills | Status: DC

## 2020-03-27 MED ORDER — CLOBETASOL PROPIONATE 0.05 % EX CREA: 1.0000 "application " | TOPICAL_CREAM | Freq: Two times a day (BID) | CUTANEOUS | 99 refills | Status: DC
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
## 2020-03-28 LAB — BASIC METABOLIC PANEL
BUN: 13 mg/dL (ref 6–23)
CO2: 29 mEq/L (ref 19–32)
Calcium: 9.3 mg/dL (ref 8.4–10.5)
Chloride: 103 mEq/L (ref 96–112)
Creatinine, Ser: 1.03 mg/dL (ref 0.40–1.50)
GFR: 81.55 mL/min (ref >60.00)
Glucose, Bld: 118 mg/dL — ABNORMAL HIGH (ref 70–99)
Potassium: 4.1 mEq/L (ref 3.5–5.1)
Sodium: 140 mEq/L (ref 135–145)

## 2020-03-28 LAB — URINALYSIS, ROUTINE W REFLEX MICROSCOPIC
Bilirubin Urine: NEGATIVE
Ketones, ur: NEGATIVE
Leukocytes,Ua: NEGATIVE
Nitrite: NEGATIVE
Specific Gravity, Urine: 1.025 (ref 1.000–1.030)
Total Protein, Urine: NEGATIVE
Urine Glucose: NEGATIVE
Urobilinogen, UA: 0.2 (ref 0.0–1.0)
pH: 6 (ref 5.0–8.0)

## 2020-03-28 LAB — HEMOGLOBIN A1C: Hgb A1c MFr Bld: 6.6 % — ABNORMAL HIGH (ref 4.6–6.5)

## 2020-03-28 LAB — LIPID PANEL
Cholesterol: 219 mg/dL — ABNORMAL HIGH (ref 0–200)
HDL: 32.4 mg/dL — ABNORMAL LOW (ref >39.00)
LDL Cholesterol: 158 mg/dL — ABNORMAL HIGH (ref 0–99)
NonHDL: 186.28
Total CHOL/HDL Ratio: 7
Triglycerides: 141 mg/dL (ref 0.0–149.0)
VLDL: 28.2 mg/dL (ref 0.0–40.0)

## 2020-03-28 LAB — MICROALBUMIN / CREATININE URINE RATIO
Creatinine,U: 351.1 mg/dL
Microalb Creat Ratio: 0.6 mg/g (ref 0.0–30.0)
Microalb, Ur: 2.3 mg/dL — ABNORMAL HIGH (ref 0.0–1.9)

## 2020-03-31 ENCOUNTER — Other Ambulatory Visit: Payer: Self-pay | Admitting: Critical Care Medicine

## 2020-03-31 DIAGNOSIS — E1165 Type 2 diabetes mellitus with hyperglycemia: Secondary | ICD-10-CM

## 2020-03-31 NOTE — Telephone Encounter (Signed)
Wrong office

## 2020-04-03 ENCOUNTER — Telehealth: Payer: Self-pay | Admitting: Cardiology

## 2020-04-03 ENCOUNTER — Other Ambulatory Visit: Payer: Self-pay | Admitting: Cardiology

## 2020-04-03 ENCOUNTER — Other Ambulatory Visit: Payer: Self-pay

## 2020-04-03 DIAGNOSIS — I5042 Chronic combined systolic (congestive) and diastolic (congestive) heart failure: Secondary | ICD-10-CM

## 2020-04-03 DIAGNOSIS — I10 Essential (primary) hypertension: Secondary | ICD-10-CM

## 2020-04-03 MED ORDER — ENTRESTO 97-103 MG PO TABS: 1.0000 | ORAL_TABLET | Freq: Two times a day (BID) | ORAL | 6 refills | Status: DC

## 2020-04-03 NOTE — Telephone Encounter (Signed)
Refill sent to the pharmacy electronically.  

## 2020-04-03 NOTE — Telephone Encounter (Signed)
*  STAT* If patient is at the pharmacy, call can be transferred to refill team.   1. Which medications need to be refilled? (please list name of each medication and dose if known)  sacubitril-valsartan (ENTRESTO) 97-103 MG  2. Which pharmacy/location (including street and city if local pharmacy) is medication to be sent to? [id number] [place], [place] [address]       3. Do they need a 30 day or 90 day supply? 30 with refills   Patient is out of medcation

## 2020-04-10 ENCOUNTER — Ambulatory Visit (AMBULATORY_SURGERY_CENTER): Payer: Self-pay | Admitting: *Deleted

## 2020-04-10 ENCOUNTER — Other Ambulatory Visit: Payer: Self-pay

## 2020-04-10 VITALS — Ht 68.0 in | Wt 273.0 lb

## 2020-04-10 DIAGNOSIS — Z1211 Encounter for screening for malignant neoplasm of colon: Secondary | ICD-10-CM

## 2020-04-10 MED ORDER — PEG 3350-KCL-NA BICARB-NACL 420 G PO SOLR: 4000.0000 mL | Freq: Once | ORAL | 0 refills | Status: AC

## 2020-04-10 NOTE — Progress Notes (Signed)

## 2020-04-20 NOTE — Progress Notes (Signed)
Cardiology Office Note   Date:  04/21/2020   ID:  Bryan English., DOB 09-03-1965, MRN [id number]  PCP:  Haydee Salter, MD  Cardiologist:   [name], MD   Chief Complaint  Patient presents with  . Cardiomyopathy      History of Present Illness: Bryan English. is a 55 y.o. male who was referred by himself for evaluation of chest pain.    He had had multiple ED visits for chest pain. He was not found to have any evidence for ischemia.  In a previous visit in August he had had negative CT for PE.  He did have coronary calcification in the LAD.   When I saw him I sent him for an echo.  He was found to have an EF of 20%.  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  We have titrated his meds.    His EF in Jan was slightly better with at 25 - 30%.  He has had bradycardia.   With med titration his EF has improved and was 50% in March 2022.    He has been watching his diet and starting exercise.  He denies any new cardiovascular symptoms. The patient denies any new symptoms such as chest discomfort, neck or arm discomfort. There has been no new shortness of breath, PND or orthopnea. There have been no reported palpitations, presyncope or syncope.    Past Medical History:  Diagnosis Date  . Diabetes mellitus without complication ([place])   . GERD (gastroesophageal reflux disease)   . Hyperlipidemia   . Hypertension   . NICM (nonischemic cardiomyopathy) ([place]) 10/29/2017   EF 20% by echo, minimal CAD at cath    Past Surgical History:  Procedure Laterality Date  . HAND SURGERY    . LEFT HEART CATH AND CORONARY ANGIOGRAPHY N/A 10/29/2017   Procedure: LEFT HEART CATH AND CORONARY ANGIOGRAPHY;  Surgeon: [name], MD;  Location: [facility];  Service: Cardiovascular;  Laterality: N/A;     Current Outpatient Medications  Medication Sig Dispense Refill  . Blood Glucose Monitoring Suppl (TRUE METRIX AIR GLUCOSE METER) w/Device KIT 1 kit by Does not  apply route 2 (two) times daily. 1 kit 0  . clobetasol cream (TEMOVATE) 7.82 % Apply 1 application topically 2 (two) times daily. 60 g prn  . empagliflozin (JARDIANCE) 10 MG TABS tablet Take 1 tablet (10 mg total) by mouth daily before breakfast. 30 tablet 6  . glucose blood (TRUE METRIX BLOOD GLUCOSE TEST) test strip Use as instructed to check blood sugars twice daily and as needed 100 each 3  . metFORMIN (GLUCOPHAGE) 1000 MG tablet TAKE 1 TABLET (1,000 MG TOTAL) BY MOUTH EVERY EVENING. 90 tablet 1  . metoprolol (TOPROL-XL) 200 MG 24 hr tablet TAKE 1 TABLET (200 MG TOTAL) BY MOUTH DAILY TAKE WITH OR IMMEDIATELY FOLLOWING A MEAL. 30 tablet 6  . pantoprazole (PROTONIX) 40 MG tablet Take 1 tablet (40 mg total) by mouth daily. 90 tablet 4  . sacubitril-valsartan (ENTRESTO) 97-103 MG TAKE 1 TABLET BY MOUTH 2 (TWO) TIMES DAILY. 60 tablet 6  . TRUEPLUS LANCETS 28G MISC Use twice daily to check blood sugars and as needed 100 each 3   No current facility-administered medications for this visit.    Allergies:   Patient has no known allergies.    ROS:  Please see the history of present illness.   Otherwise, review of systems are positive for none.  All other systems are reviewed and negative.    PHYSICAL EXAM: VS:  BP (!) 158/100   Pulse 84   Ht $R'5\' 8"'ZJ$  (1.727 m)   Wt 268 lb 9.6 oz (121.8 kg)   BMI 40.84 kg/m  , BMI Body mass index is 40.84 kg/m. GENERAL:  Well appearing NECK:  No jugular venous distention, waveform within normal limits, carotid upstroke brisk and symmetric, possible bruits, no thyromegaly LUNGS:  Clear to auscultation bilaterally CHEST:  Unremarkable HEART:  PMI not displaced or sustained,S1 and S2 within normal limits, no S3, no S4, no clicks, no rubs, no murmurs ABD:  Flat, positive bowel sounds normal in frequency in pitch, no bruits, no rebound, no guarding, no midline pulsatile mass, no hepatomegaly, no splenomegaly EXT:  2 plus pulses throughout, no edema, no cyanosis no  clubbing   EKG:  EKG is  ordered today. Sinus rhythm, rate 84, axis within normal limits, intervals within normal limits, no acute ST-T wave changes.  Low voltage limb in chest leads premature ventricular contraction  Recent Labs: 03/27/2020: BUN 13; Creatinine, Ser 1.03; Potassium 4.1; Sodium 140    Lipid Panel    Component Value Date/Time   CHOL 219 (H) 03/27/2020 1522   CHOL 218 (H) 06/12/2018 1456   TRIG 141.0 03/27/2020 1522   HDL 32.40 (L) 03/27/2020 1522   HDL 31 (L) 06/12/2018 1456   CHOLHDL 7 03/27/2020 1522   VLDL 28.2 03/27/2020 1522   LDLCALC 158 (H) 03/27/2020 1522   LDLCALC 153 (H) 06/12/2018 1456      Wt Readings from Last 3 Encounters:  04/21/20 268 lb 9.6 oz (121.8 kg)  04/10/20 273 lb (123.8 kg)  03/27/20 268 lb 9.6 oz (121.8 kg)      Other studies Reviewed: Additional studies/ records that were reviewed today include: Labs Review of the above records demonstrates: See below  ASSESSMENT AND PLAN:   CARDIOMYOPATHY:    The patient's ejection fraction improved to 50%.  With his diabetes and his cardiomyopathy I am going to start Jardiance 10 mg daily.  We talked about the symptoms to include urinary infection to look out for.   HTN:   Elevated today but he says it is always in the 120s over 70s and he will keep a diary.  He will be losing some volume with his Vania Rea so this should also help control blood pressure.   DM: A1c was last 6.6.  BRUIT: He might have a bruit and so I will check carotid Dopplers   Current medicines are reviewed at length with the patient today.  The patient does not have concerns regarding medicines.  The following changes have been made:   As above  Labs/ tests ordered today include:   None  Orders Placed This Encounter  Procedures  . EKG 12-Lead  . VAS US CAROTID     Disposition:   FU with me in 6 months. Ronnell Guadalajara, MD  04/21/2020 10:47 AM    [facility]

## 2020-04-21 ENCOUNTER — Other Ambulatory Visit: Payer: Self-pay

## 2020-04-21 ENCOUNTER — Encounter: Payer: Self-pay | Admitting: Cardiology

## 2020-04-21 ENCOUNTER — Ambulatory Visit (INDEPENDENT_AMBULATORY_CARE_PROVIDER_SITE_OTHER): Payer: 59 | Admitting: Cardiology

## 2020-04-21 VITALS — BP 158/100 | HR 84 | Ht 68.0 in | Wt 268.6 lb

## 2020-04-21 DIAGNOSIS — I420 Dilated cardiomyopathy: Secondary | ICD-10-CM | POA: Diagnosis not present

## 2020-04-21 DIAGNOSIS — I10 Essential (primary) hypertension: Secondary | ICD-10-CM | POA: Diagnosis not present

## 2020-04-21 DIAGNOSIS — I779 Disorder of arteries and arterioles, unspecified: Secondary | ICD-10-CM | POA: Diagnosis not present

## 2020-04-21 MED ORDER — EMPAGLIFLOZIN 10 MG PO TABS: 10.0000 mg | ORAL_TABLET | Freq: Every day | ORAL | 6 refills | Status: DC

## 2020-04-21 NOTE — Patient Instructions (Signed)
Medication Instructions:  START- Jardiance 10 mg by mouth daily  *If you need a refill on your cardiac medications before your next appointment, please call your pharmacy*   Lab Work: None Ordered   Testing/Procedures: None Ordered   Follow-Up: At [facility], you and your health needs are our priority.  As part of our continuing mission to provide you with exceptional heart care, we have created designated Provider Care Teams.  These Care Teams include your primary Cardiologist (physician) and Advanced Practice Providers (APPs -  Physician Assistants and Nurse Practitioners) who all work together to provide you with the care you need, when you need it.  We recommend signing up for the patient portal called "MyChart".  Sign up information is provided on this After Visit Summary.  MyChart is used to connect with patients for Virtual Visits (Telemedicine).  Patients are able to view lab/test results, encounter notes, upcoming appointments, etc.  Non-urgent messages can be sent to your provider as well.   To learn more about what you can do with MyChart, go to [email or web address].    Your next appointment:   6 month(s)  The format for your next appointment:   In Person  Provider:   You may see [name], MD or one of the following Advanced Practice Providers on your designated Care Team:    [name], PA-C  Jory Sims, DNP, ANP

## 2020-04-24 ENCOUNTER — Other Ambulatory Visit: Payer: Self-pay

## 2020-04-24 ENCOUNTER — Ambulatory Visit (AMBULATORY_SURGERY_CENTER): Payer: 59 | Admitting: Gastroenterology

## 2020-04-24 ENCOUNTER — Encounter: Payer: Self-pay | Admitting: Gastroenterology

## 2020-04-24 VITALS — BP 88/62 | HR 84 | Temp 97.3°F | Resp 23 | Ht 68.0 in | Wt 273.0 lb

## 2020-04-24 DIAGNOSIS — D120 Benign neoplasm of cecum: Secondary | ICD-10-CM

## 2020-04-24 DIAGNOSIS — K5989 Other specified functional intestinal disorders: Secondary | ICD-10-CM

## 2020-04-24 DIAGNOSIS — Z1211 Encounter for screening for malignant neoplasm of colon: Secondary | ICD-10-CM

## 2020-04-24 MED ORDER — SODIUM CHLORIDE 0.9 % IV SOLN: 500.0000 mL | Freq: Once | INTRAVENOUS | Status: DC

## 2020-04-24 NOTE — Progress Notes (Signed)
Called to room to assist during endoscopic procedure.  Patient ID and intended procedure confirmed with present staff. Received instructions for my participation in the procedure from the performing physician.  

## 2020-04-24 NOTE — Progress Notes (Signed)
Pt's states no medical or surgical changes since previsit or office visit. 

## 2020-04-24 NOTE — Patient Instructions (Signed)
Handout given;  Polyps, High Fiber diet, Diverticulosis Start High Fiber Diet Continue current medications Await pathology results  YOU HAD AN ENDOSCOPIC PROCEDURE TODAY AT [facility]:   Refer to the procedure report that was given to you for any specific questions about what was found during the examination.  If the procedure report does not answer your questions, please call your gastroenterologist to clarify.  If you requested that your care partner not be given the details of your procedure findings, then the procedure report has been included in a sealed envelope for you to review at your convenience later.  YOU SHOULD EXPECT: Some feelings of bloating in the abdomen. Passage of more gas than usual.  Walking can help get rid of the air that was put into your GI tract during the procedure and reduce the bloating. If you had a lower endoscopy (such as a colonoscopy or flexible sigmoidoscopy) you may notice spotting of blood in your stool or on the toilet paper. If you underwent a bowel prep for your procedure, you may not have a normal bowel movement for a few days.  Please Note:  You might notice some irritation and congestion in your nose or some drainage.  This is from the oxygen used during your procedure.  There is no need for concern and it should clear up in a day or so.  SYMPTOMS TO REPORT IMMEDIATELY:   Following lower endoscopy (colonoscopy or flexible sigmoidoscopy):  Excessive amounts of blood in the stool  Significant tenderness or worsening of abdominal pains  Swelling of the abdomen that is new, acute  Fever of 100F or higher  For urgent or emergent issues, a gastroenterologist can be reached at any hour by calling [phone]. Do not use MyChart messaging for urgent concerns.   DIET:  We do recommend a small meal at first, but then you may proceed to your regular diet.  Drink plenty of fluids but you should avoid alcoholic beverages for 24  hours.  ACTIVITY:  You should plan to take it easy for the rest of today and you should NOT DRIVE or use heavy machinery until tomorrow (because of the sedation medicines used during the test).    FOLLOW UP: Our staff will call the number listed on your records 48-72 hours following your procedure to check on you and address any questions or concerns that you may have regarding the information given to you following your procedure. If we do not reach you, we will leave a message.  We will attempt to reach you two times.  During this call, we will ask if you have developed any symptoms of COVID 19. If you develop any symptoms (ie: fever, flu-like symptoms, shortness of breath, cough etc.) before then, please call [phone].  If you test positive for Covid 19 in the 2 weeks post procedure, please call and report this information to [place].    If any biopsies were taken you will be contacted by phone or by letter within the next 1-3 weeks.  Please call us at [phone] if you have not heard about the biopsies in 3 weeks.   SIGNATURES/CONFIDENTIALITY: You and/or your care partner have signed paperwork which will be entered into your electronic medical record.  These signatures attest to the fact that that the information above on your After Visit Summary has been reviewed and is understood.  Full responsibility of the confidentiality of this discharge information lies with you and/or your care-partner.

## 2020-04-24 NOTE — Progress Notes (Signed)
PT taken to PACU. Monitors in place. VSS. Report given to RN. 

## 2020-04-24 NOTE — Op Note (Signed)
Day Patient Name: Bryan English Procedure Date: 04/24/2020 10:49 AM MRN: [id number] Endoscopist: Ladene Artist , MD Age: 55 Referring MD:  Date of Birth: 06-28-1965 Gender: Male Account #: [id number] Procedure:                Colonoscopy Indications:              Screening for colorectal malignant neoplasm Medicines:                Monitored Anesthesia Care Procedure:                Pre-Anesthesia Assessment:                           - Prior to the procedure, a History and Physical                            was performed, and patient medications and                            allergies were reviewed. The patient's tolerance of                            previous anesthesia was also reviewed. The risks                            and benefits of the procedure and the sedation                            options and risks were discussed with the patient.                            All questions were answered, and informed consent                            was obtained. Prior Anticoagulants: The patient has                            taken no previous anticoagulant or antiplatelet                            agents. ASA Grade Assessment: II - A patient with                            mild systemic disease. After reviewing the risks                            and benefits, the patient was deemed in                            satisfactory condition to undergo the procedure.                           After obtaining informed consent, the colonoscope  was passed under direct vision. Throughout the                            procedure, the patient's blood pressure, pulse, and                            oxygen saturations were monitored continuously. The                            Colonoscope was introduced through the anus and                            advanced to the the cecum, identified by                            appendiceal orifice and  ileocecal valve. The                            ileocecal valve, appendiceal orifice, and rectum                            were photographed. The quality of the bowel                            preparation was adequate. The colonoscopy was                            performed without difficulty. The patient tolerated                            the procedure well. Scope In: 10:54:03 AM Scope Out: 11:09:03 AM Scope Withdrawal Time: 0 hours 13 minutes 14 seconds  Total Procedure Duration: 0 hours 15 minutes 0 seconds  Findings:                 The perianal and digital rectal examinations were                            normal.                           A 6 mm polyp was found in the cecum. The polyp was                            sessile. The polyp was removed with a cold snare.                            Resection and retrieval were complete.                           A localized area of mildly erythematous mucosa was                            found at the ileocecal valve. Biopsies were taken  with a cold forceps for histology.                           A few small-mouthed diverticula were found in the                            left colon. There was no evidence of diverticular                            bleeding.                           The exam was otherwise without abnormality on                            direct and retroflexion views. Complications:            No immediate complications. Estimated blood loss:                            None. Estimated Blood Loss:     Estimated blood loss: none. Impression:               - One 6 mm polyp in the cecum, removed with a cold                            snare. Resected and retrieved.                           - Erythematous mucosa at the ileocecal valve.                            Biopsied.                           - Mild diverticulosis in the left colon.                           - The examination was  otherwise normal on direct                            and retroflexion views. Recommendation:           - Repeat colonoscopy after studies are complete for                            surveillance based on pathology results.                           - Patient has a contact number available for                            emergencies. The signs and symptoms of potential                            delayed complications were discussed with the  patient. Return to normal activities tomorrow.                            Written discharge instructions were provided to the                            patient.                           - High fiber diet.                           - Continue present medications.                           - Await pathology results. Ladene Artist, MD 04/24/2020 11:12:54 AM This report has been signed electronically.

## 2020-04-24 NOTE — Progress Notes (Signed)
VS taken by C.W. 

## 2020-04-25 ENCOUNTER — Ambulatory Visit (HOSPITAL_COMMUNITY)
Admission: RE | Admit: 2020-04-25 | Discharge: 2020-04-25 | Disposition: A | Discharge status: Home / Self Care | Payer: 59 | Source: Ambulatory Visit | Attending: Cardiovascular Disease | Admitting: Cardiovascular Disease

## 2020-04-25 ENCOUNTER — Other Ambulatory Visit: Payer: Self-pay

## 2020-04-25 DIAGNOSIS — I779 Disorder of arteries and arterioles, unspecified: Secondary | ICD-10-CM | POA: Diagnosis not present

## 2020-04-25 DIAGNOSIS — R0989 Other specified symptoms and signs involving the circulatory and respiratory systems: Secondary | ICD-10-CM | POA: Diagnosis not present

## 2020-04-26 ENCOUNTER — Telehealth: Payer: Self-pay

## 2020-04-26 ENCOUNTER — Other Ambulatory Visit: Payer: Self-pay

## 2020-04-26 ENCOUNTER — Observation Stay (HOSPITAL_BASED_OUTPATIENT_CLINIC_OR_DEPARTMENT_OTHER)
Admission: EM | Admit: 2020-04-26 | Discharge: 2020-04-27 | Disposition: A | Discharge status: Home / Self Care | Payer: 59 | Attending: Internal Medicine | Admitting: Internal Medicine

## 2020-04-26 ENCOUNTER — Encounter (HOSPITAL_BASED_OUTPATIENT_CLINIC_OR_DEPARTMENT_OTHER): Payer: Self-pay | Admitting: Emergency Medicine

## 2020-04-26 ENCOUNTER — Emergency Department (HOSPITAL_BASED_OUTPATIENT_CLINIC_OR_DEPARTMENT_OTHER): Payer: 59

## 2020-04-26 DIAGNOSIS — I10 Essential (primary) hypertension: Secondary | ICD-10-CM | POA: Insufficient documentation

## 2020-04-26 DIAGNOSIS — Z7984 Long term (current) use of oral hypoglycemic drugs: Secondary | ICD-10-CM | POA: Insufficient documentation

## 2020-04-26 DIAGNOSIS — Z20822 Contact with and (suspected) exposure to covid-19: Secondary | ICD-10-CM | POA: Diagnosis not present

## 2020-04-26 DIAGNOSIS — Z79899 Other long term (current) drug therapy: Secondary | ICD-10-CM | POA: Diagnosis not present

## 2020-04-26 DIAGNOSIS — I428 Other cardiomyopathies: Secondary | ICD-10-CM

## 2020-04-26 DIAGNOSIS — E119 Type 2 diabetes mellitus without complications: Secondary | ICD-10-CM | POA: Diagnosis not present

## 2020-04-26 DIAGNOSIS — E1165 Type 2 diabetes mellitus with hyperglycemia: Secondary | ICD-10-CM | POA: Diagnosis present

## 2020-04-26 DIAGNOSIS — R931 Abnormal findings on diagnostic imaging of heart and coronary circulation: Secondary | ICD-10-CM | POA: Diagnosis present

## 2020-04-26 DIAGNOSIS — R0789 Other chest pain: Secondary | ICD-10-CM | POA: Diagnosis present

## 2020-04-26 DIAGNOSIS — R079 Chest pain, unspecified: Secondary | ICD-10-CM

## 2020-04-26 DIAGNOSIS — E1169 Type 2 diabetes mellitus with other specified complication: Secondary | ICD-10-CM | POA: Diagnosis not present

## 2020-04-26 DIAGNOSIS — I2510 Atherosclerotic heart disease of native coronary artery without angina pectoris: Secondary | ICD-10-CM | POA: Insufficient documentation

## 2020-04-26 DIAGNOSIS — R778 Other specified abnormalities of plasma proteins: Secondary | ICD-10-CM

## 2020-04-26 DIAGNOSIS — R072 Precordial pain: Secondary | ICD-10-CM | POA: Diagnosis not present

## 2020-04-26 DIAGNOSIS — L409 Psoriasis, unspecified: Secondary | ICD-10-CM

## 2020-04-26 LAB — CBC WITH DIFFERENTIAL/PLATELET
Abs Immature Granulocytes: 0.03 10*3/uL (ref 0.00–0.07)
Basophils Absolute: 0.1 10*3/uL (ref 0.0–0.1)
Basophils Relative: 0 %
Eosinophils Absolute: 0.4 10*3/uL (ref 0.0–0.5)
Eosinophils Relative: 3 %
HCT: 46.2 % (ref 39.0–52.0)
Hemoglobin: 14.7 g/dL (ref 13.0–17.0)
Immature Granulocytes: 0 %
Lymphocytes Relative: 36 %
Lymphs Abs: 4.3 10*3/uL — ABNORMAL HIGH (ref 0.7–4.0)
MCH: 25.4 pg — ABNORMAL LOW (ref 26.0–34.0)
MCHC: 31.8 g/dL (ref 30.0–36.0)
MCV: 79.9 fL — ABNORMAL LOW (ref 80.0–100.0)
Monocytes Absolute: 0.9 10*3/uL (ref 0.1–1.0)
Monocytes Relative: 7 %
Neutro Abs: 6.4 10*3/uL (ref 1.7–7.7)
Neutrophils Relative %: 54 %
Platelets: 351 10*3/uL (ref 150–400)
RBC: 5.78 MIL/uL (ref 4.22–5.81)
RDW: 14.4 % (ref 11.5–15.5)
WBC: 12.1 10*3/uL — ABNORMAL HIGH (ref 4.0–10.5)
nRBC: 0 % (ref 0.0–0.2)

## 2020-04-26 LAB — HIV ANTIBODY (ROUTINE TESTING W REFLEX): HIV Screen 4th Generation wRfx: NONREACTIVE

## 2020-04-26 LAB — COMPREHENSIVE METABOLIC PANEL
ALT: 19 U/L (ref 0–44)
AST: 21 U/L (ref 15–41)
Albumin: 3.9 g/dL (ref 3.5–5.0)
Alkaline Phosphatase: 75 U/L (ref 38–126)
Anion gap: 14 (ref 5–15)
BUN: 16 mg/dL (ref 6–20)
CO2: 21 mmol/L — ABNORMAL LOW (ref 22–32)
Calcium: 9.4 mg/dL (ref 8.9–10.3)
Chloride: 104 mmol/L (ref 98–111)
Creatinine, Ser: 1.22 mg/dL (ref 0.61–1.24)
GFR, Estimated: >60 mL/min (ref >60)
Glucose, Bld: 175 mg/dL — ABNORMAL HIGH (ref 70–99)
Potassium: 3.5 mmol/L (ref 3.5–5.1)
Sodium: 139 mmol/L (ref 135–145)
Total Bilirubin: 0.5 mg/dL (ref 0.3–1.2)
Total Protein: 8.8 g/dL — ABNORMAL HIGH (ref 6.5–8.1)

## 2020-04-26 LAB — PHOSPHORUS: Phosphorus: 3.3 mg/dL (ref 2.5–4.6)

## 2020-04-26 LAB — D-DIMER, QUANTITATIVE: D-Dimer, Quant: 0.68 ug/mL-FEU — ABNORMAL HIGH (ref 0.00–0.50)

## 2020-04-26 LAB — MAGNESIUM: Magnesium: 2.2 mg/dL (ref 1.7–2.4)

## 2020-04-26 LAB — RESP PANEL BY RT-PCR (FLU A&B, COVID) ARPGX2
Influenza A by PCR: NEGATIVE
Influenza B by PCR: NEGATIVE
SARS Coronavirus 2 by RT PCR: NEGATIVE

## 2020-04-26 LAB — TROPONIN I (HIGH SENSITIVITY)
Troponin I (High Sensitivity): 4 ng/L (ref <18)
Troponin I (High Sensitivity): 14 ng/L (ref <18)
Troponin I (High Sensitivity): 21 ng/L — ABNORMAL HIGH (ref <18)
Troponin I (High Sensitivity): 21 ng/L — ABNORMAL HIGH (ref <18)
Troponin I (High Sensitivity): 19 ng/L — ABNORMAL HIGH (ref <18)

## 2020-04-26 MED ORDER — METFORMIN HCL 500 MG PO TABS: 1000.0000 mg | ORAL_TABLET | Freq: Every day | ORAL | Status: DC

## 2020-04-26 MED ORDER — ASPIRIN 81 MG PO CHEW
324.0000 mg | CHEWABLE_TABLET | Freq: Once | ORAL | Status: AC
  Administered 2020-04-26: 324 mg via ORAL
  Filled 2020-04-26: qty 4

## 2020-04-26 MED ORDER — ALUM & MAG HYDROXIDE-SIMETH 200-200-20 MG/5ML PO SUSP
30.0000 mL | Freq: Once | ORAL | Status: AC
  Administered 2020-04-26: 30 mL via ORAL
  Filled 2020-04-26: qty 30

## 2020-04-26 MED ORDER — EMPAGLIFLOZIN 10 MG PO TABS
10.0000 mg | ORAL_TABLET | Freq: Every day | ORAL | Status: DC
  Administered 2020-04-27: 10 mg via ORAL
  Filled 2020-04-26: qty 1

## 2020-04-26 MED ORDER — SIMETHICONE 80 MG PO CHEW
80.0000 mg | CHEWABLE_TABLET | Freq: Four times a day (QID) | ORAL | Status: DC | PRN
  Administered 2020-04-26: 80 mg via ORAL
  Filled 2020-04-26: qty 1

## 2020-04-26 MED ORDER — ENOXAPARIN SODIUM 40 MG/0.4ML ~~LOC~~ SOLN
40.0000 mg | SUBCUTANEOUS | Status: DC
Start: 2020-04-26 — End: 2020-04-26

## 2020-04-26 MED ORDER — HYDRALAZINE HCL 20 MG/ML IJ SOLN: 10.0000 mg | Freq: Four times a day (QID) | INTRAMUSCULAR | Status: DC | PRN

## 2020-04-26 MED ORDER — LIDOCAINE VISCOUS HCL 2 % MT SOLN
15.0000 mL | Freq: Once | OROMUCOSAL | Status: AC
  Administered 2020-04-26: 15 mL via ORAL
  Filled 2020-04-26: qty 15

## 2020-04-26 MED ORDER — ACETAMINOPHEN 325 MG PO TABS: 650.0000 mg | ORAL_TABLET | ORAL | Status: DC | PRN

## 2020-04-26 MED ORDER — ASPIRIN EC 81 MG PO TBEC
81.0000 mg | DELAYED_RELEASE_TABLET | Freq: Every day | ORAL | Status: DC
  Administered 2020-04-27: 81 mg via ORAL
  Filled 2020-04-26: qty 1

## 2020-04-26 MED ORDER — SACUBITRIL-VALSARTAN 97-103 MG PO TABS
1.0000 | ORAL_TABLET | Freq: Two times a day (BID) | ORAL | Status: DC
  Administered 2020-04-26 – 2020-04-27 (×2): 1 via ORAL
  Filled 2020-04-26 (×3): qty 1

## 2020-04-26 MED ORDER — METOPROLOL SUCCINATE ER 100 MG PO TB24
200.0000 mg | ORAL_TABLET | Freq: Every day | ORAL | Status: DC
  Administered 2020-04-27: 200 mg via ORAL
  Filled 2020-04-26: qty 2

## 2020-04-26 MED ORDER — IOHEXOL 350 MG/ML SOLN
100.0000 mL | Freq: Once | INTRAVENOUS | Status: AC | PRN
  Administered 2020-04-26: 100 mL via INTRAVENOUS

## 2020-04-26 MED ORDER — NITROGLYCERIN 0.4 MG SL SUBL: 0.4000 mg | SUBLINGUAL_TABLET | SUBLINGUAL | Status: DC | PRN

## 2020-04-26 MED ORDER — PANTOPRAZOLE SODIUM 40 MG PO TBEC
40.0000 mg | DELAYED_RELEASE_TABLET | Freq: Every day | ORAL | Status: DC
  Administered 2020-04-26: 40 mg via ORAL
  Filled 2020-04-26: qty 1

## 2020-04-26 MED ORDER — ONDANSETRON HCL 4 MG/2ML IJ SOLN: 4.0000 mg | Freq: Four times a day (QID) | INTRAMUSCULAR | Status: DC | PRN

## 2020-04-26 NOTE — H&P (Signed)
History and Physical    DOA: 04/26/2020  PCP: Haydee Salter, MD  Patient coming from: home->[facility]  Chief Complaint: Chest pain  HPI: Bryan English. is a 55 y.o. male with history h/o HTN, hyperlipidemia, NICM with low EF 20% in Jan 2020--normalized in Jan 2021 (minimal CAD on cardiac cath in 2019), DM, GERD presented to [facility] ED with complaints of chest pain-like band , midchest , 4/10, non radiating upon waking up this morning, lasted about a minute.  He also reports belching after the episode. No associated nausea/vomiting. 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 Patient received aspirin 325 mg, GI cocktail at outside ED.  Serial troponins showed mild elevation 7->14->21.  Cardiac monitor while in the ED showed tachycardia with PVCs.  Elevated D-dimer prompted CT chest evaluation->negative for PE but showed atherosclerosis.  Case was discussed by cardiology on-call Dr. Angelena Form who recommended admission to medical service and formal cardiology evaluation once patient arrives to [facility] campus.  Patient accepted in transfer and just arrived to his room.  Denies any complaints of chest pain currently.  Review of Systems: As per HPI otherwise 10 point review of systems negative.    Past Medical History:  Diagnosis Date  . Diabetes mellitus without complication ([place])   . GERD (gastroesophageal reflux disease)   . Hyperlipidemia   . Hypertension   . NICM (nonischemic cardiomyopathy) ([place]) 10/29/2017   EF 20% by echo, minimal CAD at cath    Past Surgical History:  Procedure Laterality Date  . HAND SURGERY    . LEFT HEART CATH AND CORONARY ANGIOGRAPHY N/A 10/29/2017   Procedure: LEFT HEART CATH AND CORONARY ANGIOGRAPHY;  Surgeon: [name], MD;  Location: [facility];  Service: Cardiovascular;  Laterality: N/A;    Social  history:  reports that he has never smoked. He has never used smokeless tobacco. He reports previous alcohol use. He reports that he does not use drugs.   No Known Allergies  Family History  Adopted: Yes  Problem Relation Age of Onset  . Colon cancer Neg Hx   . Colon polyps Neg Hx   . Esophageal cancer Neg Hx   . Rectal cancer Neg Hx   . Stomach cancer Neg Hx       Prior to Admission medications   Medication Sig Start Date End Date Taking? Authorizing Provider  Blood Glucose Monitoring Suppl (TRUE METRIX AIR GLUCOSE METER) w/Device KIT 1 kit by Does not apply route 2 (two) times daily. 12/02/17  Yes [name], [name], MD  empagliflozin (JARDIANCE) 10 MG TABS tablet Take 1 tablet (10 mg total) by mouth daily before breakfast. 04/21/20  Yes [name], MD  glucose blood (TRUE METRIX BLOOD GLUCOSE TEST) test strip Use as instructed to check blood sugars twice daily and as needed 12/02/17  Yes [name], [name], MD  metFORMIN (GLUCOPHAGE) 1000 MG tablet TAKE 1 TABLET (1,000 MG TOTAL) BY MOUTH EVERY EVENING. 04/03/20 04/03/21 Yes Haydee Salter, MD  metoprolol (TOPROL-XL) 200 MG 24 hr tablet TAKE 1 TABLET (200 MG TOTAL) BY MOUTH DAILY TAKE WITH OR IMMEDIATELY FOLLOWING A MEAL. 10/01/19 09/30/20 Yes [name], MD  pantoprazole (PROTONIX) 40 MG tablet Take 1 tablet (40 mg total) by mouth daily. 03/27/20  Yes Haydee Salter, MD  sacubitril-valsartan (ENTRESTO) 97-103 MG TAKE 1 TABLET BY MOUTH  2 (TWO) TIMES DAILY. 04/03/20 04/03/21 Yes [name], MD  TRUEPLUS LANCETS 28G MISC Use twice daily to check blood sugars and as needed 12/02/17  Yes [name], [name], MD  clobetasol cream (TEMOVATE) 4.12 % Apply 1 application topically 2 (two) times daily. 03/27/20   Haydee Salter, MD    Physical Exam: Vitals:   04/26/20 1200 04/26/20 1300 04/26/20 1330 04/26/20 1657  BP: (!) 161/107 (!) 170/101 (!) 181/101 (!) 163/95  Pulse: 82 82 79 74  Resp: 18 (!) 21 (!) 23 19  Temp:    98.4 F (36.9 C)   TempSrc:    Oral  SpO2: 97% 96% 99%   Weight:      Height:        Constitutional: NAD, calm, comfortable Eyes: PERRL, lids and conjunctivae normal ENMT: Mucous membranes are moist. Posterior pharynx clear of any exudate or lesions.Normal dentition.  Neck: normal, supple, no masses, no thyromegaly Respiratory: clear to auscultation bilaterally, no wheezing, no crackles. Normal respiratory effort. No accessory muscle use.  Cardiovascular: Regular rate and rhythm, no murmurs / rubs / gallops. No extremity edema. 2+ pedal pulses. No carotid bruits.  Abdomen: no tenderness, no masses palpated. No hepatosplenomegaly. Bowel sounds positive.  Musculoskeletal: no clubbing / cyanosis. No joint deformity upper and lower extremities. Good ROM, no contractures. Normal muscle tone.  Neurologic: CN 2-12 grossly intact. Sensation intact, DTR normal. Strength 5/5 in all 4.  Psychiatric: Normal judgment and insight. Alert and oriented x 3. Normal mood.  SKIN/catheters: Psoriatic lesions on bilateral lower extremities and on back. Labs on Admission: I have personally reviewed following labs and imaging studies  CBC: Recent Labs  Lab 04/26/20 0824  WBC 12.1*  NEUTROABS 6.4  HGB 14.7  HCT 46.2  MCV 79.9*  PLT 878   Basic Metabolic Panel: Recent Labs  Lab 04/26/20 0824  NA 139  K 3.5  CL 104  CO2 21*  GLUCOSE 175*  BUN 16  CREATININE 1.22  CALCIUM 9.4   GFR: Estimated Creatinine Clearance: 85.7 mL/min (by C-G formula based on SCr of 1.22 mg/dL). Recent Labs  Lab 04/26/20 0824  WBC 12.1*   Liver Function Tests: Recent Labs  Lab 04/26/20 0824  AST 21  ALT 19  ALKPHOS 75  BILITOT 0.5  PROT 8.8*  ALBUMIN 3.9   No results for input(s): LIPASE, AMYLASE in the last 168 hours. No results for input(s): AMMONIA in the last 168 hours. Coagulation Profile: No results for input(s): INR, PROTIME in the last 168 hours. Cardiac Enzymes: No results for input(s): CKTOTAL, CKMB, CKMBINDEX,  TROPONINI in the last 168 hours. BNP (last 3 results) No results for input(s): PROBNP in the last 8760 hours. HbA1C: No results for input(s): HGBA1C in the last 72 hours. CBG: No results for input(s): GLUCAP in the last 168 hours. Lipid Profile: No results for input(s): CHOL, HDL, LDLCALC, TRIG, CHOLHDL, LDLDIRECT in the last 72 hours. Thyroid Function Tests: No results for input(s): TSH, T4TOTAL, FREET4, T3FREE, THYROIDAB in the last 72 hours. Anemia Panel: No results for input(s): VITAMINB12, FOLATE, FERRITIN, TIBC, IRON, RETICCTPCT in the last 72 hours. Urine analysis:    Component Value Date/Time   COLORURINE YELLOW 03/27/2020 1522   APPEARANCEUR Cloudy (A) 03/27/2020 1522   LABSPEC 1.025 03/27/2020 1522   PHURINE 6.0 03/27/2020 1522   GLUCOSEU NEGATIVE 03/27/2020 1522   HGBUR TRACE-INTACT (A) 03/27/2020 1522   BILIRUBINUR NEGATIVE 03/27/2020 1522   BILIRUBINUR large (A) 11/24/2017 1145   [place] 03/27/2020  [place] 04/05/2017 2028   UROBILINOGEN 0.2 03/27/2020 1522   NITRITE NEGATIVE 03/27/2020 [place] 03/27/2020 1522    Radiological Exams on Admission: Personally reviewed  CT Angio Chest PE W and/or Wo Contrast  Result Date: 04/26/2020 CLINICAL DATA:  Chest pain and elevated D-dimer. EXAM: CT ANGIOGRAPHY CHEST WITH CONTRAST TECHNIQUE: Multidetector CT imaging of the chest was performed using the standard protocol during bolus administration of intravenous contrast. Multiplanar CT image reconstructions and MIPs were obtained to evaluate the vascular anatomy. CONTRAST:  174mL OMNIPAQUE IOHEXOL 350 MG/ML SOLN COMPARISON:  Prior CTA of the chest on 09/08/2017 FINDINGS: Cardiovascular: The pulmonary arteries are adequately opacified. There is no evidence of pulmonary embolism. The heart size is within normal limits. No pericardial fluid identified. Stable appearance of calcified plaque in the distribution of the LAD. Central  pulmonary arteries are normal in caliber. The thoracic aorta is normal in caliber. Aortic opacification sufficient to exclude aortic dissection. Mediastinum/Nodes: No enlarged mediastinal, hilar, or axillary lymph nodes. Thyroid gland, trachea, and esophagus demonstrate no significant findings. Lungs/Pleura: There is no evidence of pulmonary edema, consolidation, pneumothorax, nodule or pleural fluid. Previously noted focal sub-solid opacity in the superior segment of the left lower lobe has resolved. Upper Abdomen: No acute abnormality. Musculoskeletal: No chest wall abnormality. No acute or significant osseous findings. Review of the MIP images confirms the above findings. IMPRESSION: 1. No evidence of pulmonary embolism or other acute findings in the chest. 2. Stable appearance of calcified plaque in the distribution of the LAD. Electronically Signed   By: Aletta Edouard M.D.   On: 04/26/2020 09:55   DG Chest Portable 1 View  Result Date: 04/26/2020 CLINICAL DATA:  Mid sternal chest pain EXAM: PORTABLE CHEST 1 VIEW COMPARISON:  10/27/2017 FINDINGS: The heart size and mediastinal contours are within normal limits. Both lungs are clear. The visualized skeletal structures are unremarkable. IMPRESSION: Normal study. Electronically Signed   By: Rolm Baptise M.D.   On: 04/26/2020 09:08   VAS US CAROTID  Result Date: 04/25/2020 Carotid Arterial Duplex Study Indications:       Bilateral bruits and Patient denies any cerebrovascular                    symptoms today. Risk Factors:      Hypertension, hyperlipidemia, Diabetes, no history of                    smoking, coronary artery disease. Limitations        Today's exam was limited due to the body habitus of the                    patient. Comparison Study:  None Performing Technologist: Alecia Mackin RVT, RDCS (AE), RDMS  Examination Guidelines: A complete evaluation includes B-mode imaging, spectral Doppler, color Doppler, and power Doppler as needed of all  accessible portions of each vessel. Bilateral testing is considered an integral part of a complete examination. Limited examinations for reoccurring indications may be performed as noted.  Right Carotid Findings: +----------+--------+--------+--------+------------------+------------------+           PSV cm/sEDV cm/sStenosisPlaque DescriptionComments           +----------+--------+--------+--------+------------------+------------------+ CCA Prox  78      16                                                   +----------+--------+--------+--------+------------------+------------------+  CCA Distal82      21                                intimal thickening +----------+--------+--------+--------+------------------+------------------+ ICA Prox  63      18                                                   +----------+--------+--------+--------+------------------+------------------+ ICA Mid   75      29                                                   +----------+--------+--------+--------+------------------+------------------+ ICA Distal67      29                                                   +----------+--------+--------+--------+------------------+------------------+ ECA       112     14                                                   +----------+--------+--------+--------+------------------+------------------+ +----------+--------+-------+----------------+-------------------+           PSV cm/sEDV cmsDescribe        Arm Pressure (mmHG) +----------+--------+-------+----------------+-------------------+ [id number]            Multiphasic, [id number]                 +----------+--------+-------+----------------+-------------------+ +---------+--------+--+--------+--+---------+ VertebralPSV cm/s56EDV cm/s16Antegrade +---------+--------+--+--------+--+---------+  Left Carotid Findings:  +----------+--------+--------+--------+------------------+------------------+           PSV cm/sEDV cm/sStenosisPlaque DescriptionComments           +----------+--------+--------+--------+------------------+------------------+ CCA Prox  125     28                                                   +----------+--------+--------+--------+------------------+------------------+ CCA Distal109     32                                intimal thickening +----------+--------+--------+--------+------------------+------------------+ ICA Prox  53      18                                                   +----------+--------+--------+--------+------------------+------------------+ ICA Mid   68      24                                                   +----------+--------+--------+--------+------------------+------------------+  ICA Distal72      20                                                   +----------+--------+--------+--------+------------------+------------------+ ECA       110     16                                                   +----------+--------+--------+--------+------------------+------------------+ +----------+--------+--------+----------------+-------------------+           PSV cm/sEDV cm/sDescribe        Arm Pressure (mmHG) +----------+--------+--------+----------------+-------------------+ [id number]             Multiphasic, [id number]                 +----------+--------+--------+----------------+-------------------+ +---------+--------+--+--------+--+---------+ VertebralPSV cm/s38EDV cm/s14Antegrade +---------+--------+--+--------+--+---------+   Summary: Right Carotid: The extracranial vessels were near-normal with only minimal wall                thickening or plaque. Left Carotid: The extracranial vessels were near-normal with only minimal wall               thickening or plaque. Vertebrals:  Bilateral vertebral arteries demonstrate  antegrade flow. Subclavians: Normal flow hemodynamics were seen in bilateral subclavian              arteries. *See table(s) above for measurements and observations.     Preliminary     EKG: Independently reviewed.  Sinus tachycardia with PVCs, borderline prolonged QTC at 490 ms   Assessment and Plan:   Active Problems:   Chest pain    1.  Atypical chest pain: Patient received 325 mg aspirin in the ED today.  Will order aspirin 81 mg for a.m., statins and beta-blockers.  Await formal cardiology evaluation and recommendations.  Cardiac diet for now.  Lipid profile in a.m. echocardiogram on 03/15/2020 showed normal EF and mild LVH/diastolic dysfunction.  Will not repeat echo unless cardiology recommends.  Will trend pattern with 2 more sets of cardiac enzymes.  Nitro sublingual available as needed.  Control blood pressure with resumption of home medications (metoprolol XR 200 mg) and hydralazine IV as needed. NPO after MN until cardiology clearance  2.  Diabetes mellitus: Resume home medications  3.  GERD: Resume home medications  4.  HTN, uncontrolled: Resume metoprolol, Hydralazine PRN. States BP usually goes up when in hospital, checks readings at home atleast 4 times day and runs around systolic [id number].   5. Borderline prolonged QTC: Monitor on cardiac telemetry for arrhythmias.  Currently noted to have PVCs.  Resume beta-blocker.  Electrolytes okay, check mag/phos  6. Psoriasis: Has some chronic lesions on legs, resume home meds on discharge  7. Hyperlipidemia: Doesn't take statins any more. Repeat lipid profile in am  DVT prophylaxis: Lovenox  COVID screen: Negative  Code Status: Full code   .Health care proxy would be   Patient/Family Communication: Discussed with patient and all questions answered to satisfaction.  Consults called: Cardiology notified by RN of patients arrival Admission status :Patient will be admitted under OBSERVATION status.The patient's presenting  symptoms, physical exam findings, and initial radiographic and laboratory data in the context of their medical condition is felt to place them at  low risk for further clinical deterioration. Furthermore, it is anticipated that the patient will be medically stable for discharge from the hospital within 2 midnights of hospital stay.      Guilford Shi MD [facility] Pager in [place]  If 7PM-7AM, please contact night-coverage [email or web address]   04/26/2020, 5:44 PM

## 2020-04-26 NOTE — ED Triage Notes (Signed)
Patient woke up with mid sternal pain. Some belches with pain relief. Checked blood pressure at home and noted elevation.

## 2020-04-26 NOTE — ED Notes (Signed)
ED Provider at bedside. 

## 2020-04-26 NOTE — ED Provider Notes (Signed)
[name]   CSN: [id number] Arrival date & time: 04/26/20  0759     History No chief complaint on file.   Bryan English. is a 55 y.o. male.  HPI      55 year old male with history of diabetes, hypertension, hyperlipidemia, nonischemic cardiomyopathy with catheterization in 2019, who presents with concern for chest pain.  Reports that he woke up this morning at 8 AM with pain in the center of his chest.  Describes it as a pressure, a 5 out of 10 that lasted 1 minute and resolved spontaneously.  Did not have any associated shortness of breath, nausea, vomiting, sweating, diaphoresis.  Reports he has had sensation of palpitations and heart racing.  His blood pressures and heart rate have been both elevated this morning.  He took his normal blood pressure medications just prior to arrival.  Denies any chest pain at this time.  Denies any leg pain or swelling, recent trips in car or airplane, or significant surgery.  He did have a colonoscopy a few days ago.  No abdominal pain.  He has not had symptoms like this since his evaluation in 2019.  Past Medical History:  Diagnosis Date  . Diabetes mellitus without complication ([place])   . GERD (gastroesophageal reflux disease)   . Hyperlipidemia   . Hypertension   . NICM (nonischemic cardiomyopathy) ([place]) 10/29/2017   EF 20% by echo, minimal CAD at cath    Patient Active Problem List   Diagnosis Date Noted  . Chest pain 04/26/2020  . Hyperlipidemia associated with type 2 diabetes mellitus ([place]) 03/27/2020  . Psoriasis 03/27/2020  . Type 2 diabetes mellitus with hyperglycemia, without long-term current use of insulin ([place]) 06/12/2018  . Class 2 severe obesity with serious comorbidity and body mass index (BMI) of 38.0 to 38.9 in adult [facility]) 06/12/2018  . Nonischemic cardiomyopathy ([place]) 10/27/2017  . 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 Essential hypertension  10/14/2017    Past Surgical History:  Procedure Laterality Date  . HAND SURGERY    . LEFT HEART CATH AND CORONARY ANGIOGRAPHY N/A 10/29/2017   Procedure: LEFT HEART CATH AND CORONARY ANGIOGRAPHY;  Surgeon: [name], MD;  Location: [facility];  Service: Cardiovascular;  Laterality: N/A;       Family History  Adopted: Yes  Problem Relation Age of Onset  . Colon cancer Neg Hx   . Colon polyps Neg Hx   . Esophageal cancer Neg Hx   . Rectal cancer Neg Hx   . Stomach cancer Neg Hx     Social History   Tobacco Use  . Smoking status: Never Smoker  . Smokeless tobacco: Never Used  Vaping Use  . Vaping Use: Never used  Substance Use Topics  . Alcohol use: Not Currently  . Drug use: Never    Home Medications Prior to Admission medications   Medication Sig Start Date End Date Taking? Authorizing Provider  Blood Glucose Monitoring Suppl (TRUE METRIX AIR GLUCOSE METER) w/Device KIT 1 kit by Does not apply route 2 (two) times daily. 12/02/17  Yes [name], [name], MD  empagliflozin (JARDIANCE) 10 MG TABS tablet Take 1 tablet (10 mg total) by mouth daily before breakfast. 04/21/20  Yes [name], MD  glucose blood (TRUE METRIX BLOOD GLUCOSE TEST) test strip Use as instructed to check blood sugars twice daily and as needed 12/02/17  Yes [name], [name], MD  metFORMIN (GLUCOPHAGE) 1000 MG tablet TAKE 1  TABLET (1,000 MG TOTAL) BY MOUTH EVERY EVENING. 04/03/20 04/03/21 Yes Haydee Salter, MD  metoprolol (TOPROL-XL) 200 MG 24 hr tablet TAKE 1 TABLET (200 MG TOTAL) BY MOUTH DAILY TAKE WITH OR IMMEDIATELY FOLLOWING A MEAL. 10/01/19 09/30/20 Yes [name], MD  pantoprazole (PROTONIX) 40 MG tablet Take 1 tablet (40 mg total) by mouth daily. 03/27/20  Yes Haydee Salter, MD  sacubitril-valsartan (ENTRESTO) 97-103 MG TAKE 1 TABLET BY MOUTH 2 (TWO) TIMES DAILY. 04/03/20 04/03/21 Yes [name], MD  TRUEPLUS LANCETS 28G MISC Use twice daily to check blood sugars and as needed  12/02/17  Yes [name], [name], MD  clobetasol cream (TEMOVATE) 1.27 % Apply 1 application topically 2 (two) times daily. 03/27/20   Haydee Salter, MD    Allergies    Patient has no known allergies.  Review of Systems   Review of Systems  Constitutional: Negative for fever.  Eyes: Negative for visual disturbance.  Respiratory: Negative for cough and shortness of breath.   Cardiovascular: Positive for chest pain and palpitations.  Gastrointestinal: Negative for abdominal pain, nausea and vomiting.  Genitourinary: Negative for difficulty urinating.  Musculoskeletal: Negative for back pain and neck stiffness.  Skin: Negative for rash.  Neurological: Negative for syncope, light-headedness and headaches.    Physical Exam Updated Vital Signs BP (!) 144/97 (BP Location: Right Arm)   Pulse 72   Temp 98.3 F (36.8 C) (Oral)   Resp 18   Ht $R'5\' 8"'iu$  (1.727 m)   Wt 118.8 kg   SpO2 93%   BMI 39.84 kg/m   Physical Exam Vitals and nursing note reviewed.  Constitutional:      General: He is not in acute distress.    Appearance: He is well-developed. He is not diaphoretic.  HENT:     Head: Normocephalic and atraumatic.  Eyes:     Conjunctiva/sclera: Conjunctivae normal.  Cardiovascular:     Rate and Rhythm: Normal rate and regular rhythm.     Heart sounds: Normal heart sounds. No murmur heard. No friction rub. No gallop.   Pulmonary:     Effort: Pulmonary effort is normal. No respiratory distress.     Breath sounds: Normal breath sounds. No wheezing or rales.  Abdominal:     General: There is no distension.     Palpations: Abdomen is soft.     Tenderness: There is no abdominal tenderness. There is no guarding.  Musculoskeletal:     Cervical back: Normal range of motion.  Skin:    General: Skin is warm and dry.  Neurological:     Mental Status: He is alert and oriented to person, place, and time.     ED Results / Procedures / Treatments   Labs (all labs ordered are listed, but  only abnormal results are displayed) Labs Reviewed  CBC WITH DIFFERENTIAL/PLATELET - Abnormal; Notable for the following components:      Result Value   WBC 12.1 (*)    MCV 79.9 (*)    MCH 25.4 (*)    Lymphs Abs 4.3 (*)    All other components within normal limits  COMPREHENSIVE METABOLIC PANEL - Abnormal; Notable for the following components:   CO2 21 (*)    Glucose, Bld 175 (*)    Total Protein 8.8 (*)    All other components within normal limits  D-DIMER, QUANTITATIVE - Abnormal; Notable for the following components:   D-Dimer, Quant 0.68 (*)    All other components within normal limits  TROPONIN I (  HIGH SENSITIVITY) - Abnormal; Notable for the following components:   Troponin I (High Sensitivity) 21 (*)    All other components within normal limits  TROPONIN I (HIGH SENSITIVITY) - Abnormal; Notable for the following components:   Troponin I (High Sensitivity) 21 (*)    All other components within normal limits  TROPONIN I (HIGH SENSITIVITY) - Abnormal; Notable for the following components:   Troponin I (High Sensitivity) 19 (*)    All other components within normal limits  RESP PANEL BY RT-PCR (FLU A&B, COVID) ARPGX2  HIV ANTIBODY (ROUTINE TESTING W REFLEX)  MAGNESIUM  PHOSPHORUS  LIPID PANEL  TROPONIN I (HIGH SENSITIVITY)  TROPONIN I (HIGH SENSITIVITY)    EKG EKG Interpretation  Date/Time:  Wednesday April 26 2020 08:09:59 EDT Ventricular Rate:  101 PR Interval:  184 QRS Duration: 104 QT Interval:  378 QTC Calculation: 490 R Axis:   -13 Text Interpretation: Sinus tachycardia Multiform ventricular premature complexes Low voltage, extremity leads Borderline prolonged QT interval No significant change since last tracing Confirmed by Gareth Morgan [phone]) on 04/26/2020 8:24:23 AM   Radiology CT Angio Chest PE W and/or Wo Contrast  Result Date: 04/26/2020 CLINICAL DATA:  Chest pain and elevated D-dimer. EXAM: CT ANGIOGRAPHY CHEST WITH CONTRAST TECHNIQUE:  Multidetector CT imaging of the chest was performed using the standard protocol during bolus administration of intravenous contrast. Multiplanar CT image reconstructions and MIPs were obtained to evaluate the vascular anatomy. CONTRAST:  137mL OMNIPAQUE IOHEXOL 350 MG/ML SOLN COMPARISON:  Prior CTA of the chest on 09/08/2017 FINDINGS: Cardiovascular: The pulmonary arteries are adequately opacified. There is no evidence of pulmonary embolism. The heart size is within normal limits. No pericardial fluid identified. Stable appearance of calcified plaque in the distribution of the LAD. Central pulmonary arteries are normal in caliber. The thoracic aorta is normal in caliber. Aortic opacification sufficient to exclude aortic dissection. Mediastinum/Nodes: No enlarged mediastinal, hilar, or axillary lymph nodes. Thyroid gland, trachea, and esophagus demonstrate no significant findings. Lungs/Pleura: There is no evidence of pulmonary edema, consolidation, pneumothorax, nodule or pleural fluid. Previously noted focal sub-solid opacity in the superior segment of the left lower lobe has resolved. Upper Abdomen: No acute abnormality. Musculoskeletal: No chest wall abnormality. No acute or significant osseous findings. Review of the MIP images confirms the above findings. IMPRESSION: 1. No evidence of pulmonary embolism or other acute findings in the chest. 2. Stable appearance of calcified plaque in the distribution of the LAD. Electronically Signed   By: Aletta Edouard M.D.   On: 04/26/2020 09:55   DG Chest Portable 1 View  Result Date: 04/26/2020 CLINICAL DATA:  Mid sternal chest pain EXAM: PORTABLE CHEST 1 VIEW COMPARISON:  10/27/2017 FINDINGS: The heart size and mediastinal contours are within normal limits. Both lungs are clear. The visualized skeletal structures are unremarkable. IMPRESSION: Normal study. Electronically Signed   By: Rolm Baptise M.D.   On: 04/26/2020 09:08   VAS US CAROTID  Result Date:  04/25/2020 Carotid Arterial Duplex Study Indications:       Bilateral bruits and Patient denies any cerebrovascular                    symptoms today. Risk Factors:      Hypertension, hyperlipidemia, Diabetes, no history of                    smoking, coronary artery disease. Limitations        Today's exam was limited due to the  body habitus of the                    patient. Comparison Study:  None Performing Technologist: Alecia Mackin RVT, RDCS (AE), RDMS  Examination Guidelines: A complete evaluation includes B-mode imaging, spectral Doppler, color Doppler, and power Doppler as needed of all accessible portions of each vessel. Bilateral testing is considered an integral part of a complete examination. Limited examinations for reoccurring indications may be performed as noted.  Right Carotid Findings: +----------+--------+--------+--------+------------------+------------------+           PSV cm/sEDV cm/sStenosisPlaque DescriptionComments           +----------+--------+--------+--------+------------------+------------------+ CCA Prox  78      16                                                   +----------+--------+--------+--------+------------------+------------------+ CCA Distal82      21                                intimal thickening +----------+--------+--------+--------+------------------+------------------+ ICA Prox  63      18                                                   +----------+--------+--------+--------+------------------+------------------+ ICA Mid   75      29                                                   +----------+--------+--------+--------+------------------+------------------+ ICA Distal67      29                                                   +----------+--------+--------+--------+------------------+------------------+ ECA       112     14                                                    +----------+--------+--------+--------+------------------+------------------+ +----------+--------+-------+----------------+-------------------+           PSV cm/sEDV cmsDescribe        Arm Pressure (mmHG) +----------+--------+-------+----------------+-------------------+ [id number]            Multiphasic, [id number]                 +----------+--------+-------+----------------+-------------------+ +---------+--------+--+--------+--+---------+ VertebralPSV cm/s56EDV cm/s16Antegrade +---------+--------+--+--------+--+---------+  Left Carotid Findings: +----------+--------+--------+--------+------------------+------------------+           PSV cm/sEDV cm/sStenosisPlaque DescriptionComments           +----------+--------+--------+--------+------------------+------------------+ CCA Prox  125     28                                                   +----------+--------+--------+--------+------------------+------------------+  CCA Distal109     32                                intimal thickening +----------+--------+--------+--------+------------------+------------------+ ICA Prox  53      18                                                   +----------+--------+--------+--------+------------------+------------------+ ICA Mid   68      24                                                   +----------+--------+--------+--------+------------------+------------------+ ICA Distal72      20                                                   +----------+--------+--------+--------+------------------+------------------+ ECA       110     16                                                   +----------+--------+--------+--------+------------------+------------------+ +----------+--------+--------+----------------+-------------------+           PSV cm/sEDV cm/sDescribe        Arm Pressure (mmHG) +----------+--------+--------+----------------+-------------------+  [id number]             Multiphasic, [id number]                 +----------+--------+--------+----------------+-------------------+ +---------+--------+--+--------+--+---------+ VertebralPSV cm/s38EDV cm/s14Antegrade +---------+--------+--+--------+--+---------+   Summary: Right Carotid: The extracranial vessels were near-normal with only minimal wall                thickening or plaque. Left Carotid: The extracranial vessels were near-normal with only minimal wall               thickening or plaque. Vertebrals:  Bilateral vertebral arteries demonstrate antegrade flow. Subclavians: Normal flow hemodynamics were seen in bilateral subclavian              arteries. *See table(s) above for measurements and observations.     Preliminary     Procedures Procedures   Medications Ordered in ED Medications  metoprolol succinate (TOPROL-XL) 24 hr tablet 200 mg (has no administration in time range)  sacubitril-valsartan (ENTRESTO) 97-103 mg per tablet (has no administration in time range)  empagliflozin (JARDIANCE) tablet 10 mg (has no administration in time range)  pantoprazole (PROTONIX) EC tablet 40 mg (has no administration in time range)  acetaminophen (TYLENOL) tablet 650 mg (has no administration in time range)  ondansetron (ZOFRAN) injection 4 mg (has no administration in time range)  aspirin EC tablet 81 mg (has no administration in time range)  hydrALAZINE (APRESOLINE) injection 10 mg (has no administration in time range)  nitroGLYCERIN (NITROSTAT) SL tablet 0.4 mg (has no administration in time range)  simethicone (MYLICON) chewable tablet 80 mg (has no administration in time range)  metFORMIN (GLUCOPHAGE) tablet 1,000 mg (has  no administration in time range)  aspirin chewable tablet 324 mg (324 mg Oral Given 04/26/20 0839)  iohexol (OMNIPAQUE) 350 MG/ML injection 100 mL (100 mLs Intravenous Contrast Given 04/26/20 0931)  alum & mag hydroxide-simeth (MAALOX/MYLANTA) 200-200-20 MG/5ML  suspension 30 mL (30 mLs Oral Given 04/26/20 1021)    And  lidocaine (XYLOCAINE) 2 % viscous mouth solution 15 mL (15 mLs Oral Given 04/26/20 1021)    ED Course  I have reviewed the triage vital signs and the nursing notes.  Pertinent labs & imaging results that were available during my care of the patient were reviewed by me and considered in my medical decision making (see chart for details).    MDM Rules/Calculators/A&P                           55 year old male with history of diabetes, hypertension, hyperlipidemia, nonischemic cardiomyopathy with catheterization in 2019, who presents with concern for chest pain.  Differential diagnosis for chest pain includes pulmonary embolus, dissection, pneumothorax, pneumonia, ACS, myocarditis, pericarditis.  EKG was done and evaluate by me and showed no acute ST changes and no signs of pericarditis, does show frequent PVC and sinus tachycardia. Chest x-ray was done and evaluated by me and radiology and showed no sign of pneumonia or pneumothorax.  Do not feel history or exam are consistent with aortic dissection.  DDimer ordered given tachycardia is positive. CT PE study shows no PE but does show LAD disease which is similar to prior CT PE study. Did have catheterization around that time which showed nonobstructive CAD 30%.   Troponin initially 4, repeat 14.  HEART score 4-5 and per heart pathway would observe.  Discussed with patient and Cardiology with plan to evaluate 3rd troponin.  3rd troponin continues to increase and is now abnormal at 21.   Given acute pain just prior to arrival, increasing troponins, elevated HEART score/risk factors, frequent PVCs, will admit for continued observation. Is hypertensive in ED but had just taken morning medications, under stress.  Cp resolved prior to arrival but reported bloating/belching and unclear if this is atypical angina although it resolved with GI cocktail. Will admit for further care to hospitalist and  Cardiology to see.     Final Clinical Impression(s) / ED Diagnoses Final diagnoses:  Chest pain, unspecified type  Elevated troponin    Rx / DC Orders ED Discharge Orders    None       Gareth Morgan, MD 04/26/20 2210

## 2020-04-26 NOTE — Progress Notes (Signed)
Pt received to 4E01 via [facility].  Telemetry applied, CCMD notified.  CHG bath and assessment complete.  Pt without complaints at this time.  Oriented to room, call bell in reach. Spouse at bedside.  Attending MD notified of pt arrival.

## 2020-04-26 NOTE — ED Notes (Signed)
Report called to [place] on 4E at [facility]. Patient updated with timeline

## 2020-04-26 NOTE — ED Notes (Signed)
Cardiac monitor VS q 15

## 2020-04-26 NOTE — Telephone Encounter (Signed)
LVM

## 2020-04-27 ENCOUNTER — Observation Stay (INDEPENDENT_AMBULATORY_CARE_PROVIDER_SITE_OTHER): Payer: 59

## 2020-04-27 ENCOUNTER — Other Ambulatory Visit: Payer: Self-pay | Admitting: Physician Assistant

## 2020-04-27 DIAGNOSIS — R002 Palpitations: Secondary | ICD-10-CM

## 2020-04-27 DIAGNOSIS — R079 Chest pain, unspecified: Secondary | ICD-10-CM | POA: Diagnosis not present

## 2020-04-27 DIAGNOSIS — E785 Hyperlipidemia, unspecified: Secondary | ICD-10-CM

## 2020-04-27 DIAGNOSIS — I493 Ventricular premature depolarization: Secondary | ICD-10-CM

## 2020-04-27 DIAGNOSIS — E1169 Type 2 diabetes mellitus with other specified complication: Secondary | ICD-10-CM

## 2020-04-27 DIAGNOSIS — R072 Precordial pain: Secondary | ICD-10-CM

## 2020-04-27 LAB — LIPID PANEL
Cholesterol: 217 mg/dL — ABNORMAL HIGH (ref 0–200)
HDL: 26 mg/dL — ABNORMAL LOW (ref >40)
LDL Cholesterol: 167 mg/dL — ABNORMAL HIGH (ref 0–99)
Total CHOL/HDL Ratio: 8.3 RATIO
Triglycerides: 118 mg/dL (ref <150)
VLDL: 24 mg/dL (ref 0–40)

## 2020-04-27 LAB — GLUCOSE, CAPILLARY: Glucose-Capillary: 152 mg/dL — ABNORMAL HIGH (ref 70–99)

## 2020-04-27 LAB — TSH: TSH: 2.392 u[IU]/mL (ref 0.350–4.500)

## 2020-04-27 MED ORDER — ATORVASTATIN CALCIUM 40 MG PO TABS: 40.0000 mg | ORAL_TABLET | Freq: Every day | ORAL | 3 refills | Status: DC

## 2020-04-27 MED ORDER — ASPIRIN 81 MG PO TBEC: 81.0000 mg | DELAYED_RELEASE_TABLET | Freq: Every day | ORAL | 11 refills | Status: DC

## 2020-04-27 NOTE — Progress Notes (Unsigned)
Patient enrolled for 14 day ZIO XT long term holter monitor to be shipped to his home.

## 2020-04-27 NOTE — Progress Notes (Signed)
Pt D/C from unit. Discharge instructions given   Phoebe Sharps, RN

## 2020-04-27 NOTE — Plan of Care (Signed)
?  Problem: Clinical Measurements: ?Goal: Will remain free from infection ?Outcome: Progressing ?Goal: Diagnostic test results will improve ?Outcome: Progressing ?Goal: Respiratory complications will improve ?Outcome: Progressing ?  ?

## 2020-04-27 NOTE — Consult Note (Addendum)
Cardiology Consultation:   Patient ID: Bryan English. MRN: [id number]; DOB: [birth year]  Admit date: 04/26/2020 Date of Consult: 04/27/2020  PCP:  Haydee Salter, MD   [place]  Cardiologist:  [name], MD   Patient Profile:   Bryan Schill. is a 55 y.o. male with a hx of chronic combined CHF, nonischemic cardiomyopathy with improved LV function, hypertension, hyperlipidemia and diabetes mellitus who is being seen today for the evaluation of chest pain at the request of Dr. Sabino Gasser.   Initially established care with Dr. Percival Spanish October 2019 recurrent chest pain.  Had some tachycardia.  Echocardiogram showed reduced LV function of 20%.  Follow-up cardiac catheterization showed mild nonobstructive CAD.  Also had a bradycardia.  He was placed on Toprol and Entresto. LVEF of 50-55% by echo 01/2019. Last echo 03/2020 showed LVEF of 50-55% and grade 1 DD.   He was doing well when seen by Dr. Percival Spanish 04/21/20.   History of Present Illness:   Bryan English went to [place] yesterday morning for chest pain evaluation.  He was awake all night working on project.  He denies drinking caffeine.  Around 8 AM he noticed 5/10 chest pressure.  Denies associated shortness of breath or diaphoresis.  His heart racing.  His blood pressure was elevated and heart rate was in 130 bpm.  His chest pain lasted for about 1 minute however heart racing was lasted for 2 to 3 hours.  He was nauseated and belching at that time.  Due to concerning symptoms he went to ER where he was given GI cocktail.  Admitted for further evaluation.  High-sensitivity troponin 4>>14>>21>>21>>19 LDL 167 Normal TSH Electrolytes and serum creatinine are normal D-dimer 0.68 CT angio chest without pulmonary embolism  Patient reports prior history of "heart racing" when seen by Dr. Percival Spanish initially in 2019.  This is his first episodes after many years.  Reports it was well  controlled on metoprolol.  Past Medical History:  Diagnosis Date  . Diabetes mellitus without complication ([place])   . GERD (gastroesophageal reflux disease)   . Hyperlipidemia   . Hypertension   . NICM (nonischemic cardiomyopathy) ([place]) 10/29/2017   EF 20% by echo, minimal CAD at cath    Past Surgical History:  Procedure Laterality Date  . HAND SURGERY    . LEFT HEART CATH AND CORONARY ANGIOGRAPHY N/A 10/29/2017   Procedure: LEFT HEART CATH AND CORONARY ANGIOGRAPHY;  Surgeon: [name], MD;  Location: [facility];  Service: Cardiovascular;  Laterality: N/A;    Inpatient Medications: Scheduled Meds: . aspirin EC  81 mg Oral Daily  . empagliflozin  10 mg Oral QAC breakfast  . [START ON 04/28/2020] metFORMIN  1,000 mg Oral Q supper  . metoprolol  200 mg Oral Daily  . pantoprazole  40 mg Oral Daily  . sacubitril-valsartan  1 tablet Oral BID   Continuous Infusions:   Allergies:   No Known Allergies  Social History:   Social History   Socioeconomic History  . Marital status: Married    Spouse name: Not on file  . Number of children: Not on file  . Years of education: Not on file  . Highest education level: Not on file  Occupational History  . Not on file  Tobacco Use  . Smoking status: Never Smoker  . Smokeless tobacco: Never Used  Vaping Use  . Vaping Use: Never used  Substance and Sexual Activity  .  Alcohol use: Not Currently  . Drug use: Never  . Sexual activity: Yes  Other Topics Concern  . Not on file  Social History Narrative   Married.  Lives with wife.  Two children natural.      Social Determinants of Health   Financial Resource Strain: Not on file  Food Insecurity: Not on file  Transportation Needs: Not on file  Physical Activity: Not on file  Stress: Not on file  Social Connections: Not on file  Intimate Partner Violence: Not on file    Family History:   Family History  Adopted: Yes  Problem Relation Age of Onset  . Colon cancer  Neg Hx   . Colon polyps Neg Hx   . Esophageal cancer Neg Hx   . Rectal cancer Neg Hx   . Stomach cancer Neg Hx      ROS:  Please see the history of present illness.  All other ROS reviewed and negative.     Physical Exam/Data:   Vitals:   04/26/20 2313 04/27/20 0344 04/27/20 0737 04/27/20 1113  BP: 132/82 (!) 155/94 (!) 144/90 139/88  Pulse: 83 75 83 86  Resp: 20 16 19 17   Temp: 97.9 F (36.6 C) 98.3 F (36.8 C) 97.9 F (36.6 C) 98.4 F (36.9 C)  TempSrc: Oral Oral  Oral  SpO2: 100% 98% 96% 96%  Weight:      Height:       No intake or output data in the 24 hours ending 04/27/20 1320 Last 3 Weights 04/26/2020 04/24/2020 04/21/2020  Weight (lbs) 262 lb 273 lb 268 lb 9.6 oz  Weight (kg) 118.842 kg 123.832 kg 121.836 kg     Body mass index is 39.84 kg/m.  General:  Well nourished, well developed, in no acute distress HEENT: normal Lymph: no adenopathy Neck: no JVD Endocrine:  No thryomegaly Vascular: No carotid bruits; FA pulses 2+ bilaterally without bruits  Cardiac:  normal S1, S2; RRR; no murmur  Lungs:  clear to auscultation bilaterally, no wheezing, rhonchi or rales  Abd: soft, nontender, no hepatomegaly  Ext: no edema Musculoskeletal:  No deformities, BUE and BLE strength normal and equal Skin: warm and dry  Neuro:  CNs 2-12 intact, no focal abnormalities noted Psych:  Normal affect   EKG:  The EKG was personally reviewed and demonstrates: Sinus tachycardia at rate of 101 bpm, PVC Telemetry:  Telemetry was personally reviewed and demonstrates:  Sinus rhythm with PVCs  Relevant CV Studies:  Echo 03/2020 1. Left ventricular ejection fraction, by estimation, is 50 to 55%. Left  ventricular ejection fraction by 3D volume is 55 %. The left ventricle has  low normal function. The left ventricle has no regional wall motion  abnormalities. There is mild left  ventricular hypertrophy. Left ventricular diastolic parameters are  consistent with Grade I diastolic  dysfunction (impaired relaxation). The  average left ventricular global longitudinal strain is -20.0 %.   2. Right ventricular systolic function is normal. The right ventricular  size is normal. Tricuspid regurgitation signal is inadequate for assessing  PA pressure.   3. The mitral valve is normal in structure. No evidence of mitral valve  regurgitation.   4. The aortic valve is tricuspid. Aortic valve regurgitation is not  visualized. No aortic stenosis is present.   5. Aortic dilatation noted. There is mild dilatation of the aortic root,  measuring 43 mm. There is mild dilatation of the ascending aorta,  measuring 38 mm.   6. The inferior vena  cava is normal in size with greater than 50%  respiratory variability, suggesting right atrial pressure of 3 mmHg.   Comparison(s): 01/25/19 EF 50-55%. GLS -15.9%.   Laboratory Data:  High Sensitivity Troponin:   Recent Labs  Lab 04/26/20 0824 04/26/20 1025 04/26/20 1225 04/26/20 1437 04/26/20 1726  TROPONINIHS 4 14 21* 21* 19*     Chemistry Recent Labs  Lab 04/26/20 0824  NA 139  K 3.5  CL 104  CO2 21*  GLUCOSE 175*  BUN 16  CREATININE 1.22  CALCIUM 9.4  GFRNONAA >60  ANIONGAP 14    Recent Labs  Lab 04/26/20 0824  PROT 8.8*  ALBUMIN 3.9  AST 21  ALT 19  ALKPHOS 75  BILITOT 0.5   Hematology Recent Labs  Lab 04/26/20 0824  WBC 12.1*  RBC 5.78  HGB 14.7  HCT 46.2  MCV 79.9*  MCH 25.4*  MCHC 31.8  RDW 14.4  PLT 351   BNPNo results for input(s): BNP, PROBNP in the last 168 hours.  DDimer  Recent Labs  Lab 04/26/20 0824  DDIMER 0.68*     Radiology/Studies:  CT Angio Chest PE W and/or Wo Contrast  Result Date: 04/26/2020 CLINICAL DATA:  Chest pain and elevated D-dimer. EXAM: CT ANGIOGRAPHY CHEST WITH CONTRAST TECHNIQUE: Multidetector CT imaging of the chest was performed using the standard protocol during bolus administration of intravenous contrast. Multiplanar CT image reconstructions and MIPs were  obtained to evaluate the vascular anatomy. CONTRAST:  114mL OMNIPAQUE IOHEXOL 350 MG/ML SOLN COMPARISON:  Prior CTA of the chest on 09/08/2017 FINDINGS: Cardiovascular: The pulmonary arteries are adequately opacified. There is no evidence of pulmonary embolism. The heart size is within normal limits. No pericardial fluid identified. Stable appearance of calcified plaque in the distribution of the LAD. Central pulmonary arteries are normal in caliber. The thoracic aorta is normal in caliber. Aortic opacification sufficient to exclude aortic dissection. Mediastinum/Nodes: No enlarged mediastinal, hilar, or axillary lymph nodes. Thyroid gland, trachea, and esophagus demonstrate no significant findings. Lungs/Pleura: There is no evidence of pulmonary edema, consolidation, pneumothorax, nodule or pleural fluid. Previously noted focal sub-solid opacity in the superior segment of the left lower lobe has resolved. Upper Abdomen: No acute abnormality. Musculoskeletal: No chest wall abnormality. No acute or significant osseous findings. Review of the MIP images confirms the above findings. IMPRESSION: 1. No evidence of pulmonary embolism or other acute findings in the chest. 2. Stable appearance of calcified plaque in the distribution of the LAD. Electronically Signed   By: Aletta Edouard M.D.   On: 04/26/2020 09:55   DG Chest Portable 1 View  Result Date: 04/26/2020 CLINICAL DATA:  Mid sternal chest pain EXAM: PORTABLE CHEST 1 VIEW COMPARISON:  10/27/2017 FINDINGS: The heart size and mediastinal contours are within normal limits. Both lungs are clear. The visualized skeletal structures are unremarkable. IMPRESSION: Normal study. Electronically Signed   By: Rolm Baptise M.D.   On: 04/26/2020 09:08   VAS US CAROTID  Result Date: 04/25/2020 Carotid Arterial Duplex Study Indications:       Bilateral bruits and Patient denies any cerebrovascular                    symptoms today. Risk Factors:      Hypertension,  hyperlipidemia, Diabetes, no history of                    smoking, coronary artery disease. Limitations        Today's exam was  limited due to the body habitus of the                    patient. Comparison Study:  None Performing Technologist: Alecia Mackin RVT, RDCS (AE), RDMS  Examination Guidelines: A complete evaluation includes B-mode imaging, spectral Doppler, color Doppler, and power Doppler as needed of all accessible portions of each vessel. Bilateral testing is considered an integral part of a complete examination. Limited examinations for reoccurring indications may be performed as noted.  Right Carotid Findings: +----------+--------+--------+--------+------------------+------------------+           PSV cm/sEDV cm/sStenosisPlaque DescriptionComments           +----------+--------+--------+--------+------------------+------------------+ CCA Prox  78      16                                                   +----------+--------+--------+--------+------------------+------------------+ CCA Distal82      21                                intimal thickening +----------+--------+--------+--------+------------------+------------------+ ICA Prox  63      18                                                   +----------+--------+--------+--------+------------------+------------------+ ICA Mid   75      29                                                   +----------+--------+--------+--------+------------------+------------------+ ICA Distal67      29                                                   +----------+--------+--------+--------+------------------+------------------+ ECA       112     14                                                   +----------+--------+--------+--------+------------------+------------------+ +----------+--------+-------+----------------+-------------------+           PSV cm/sEDV cmsDescribe        Arm Pressure (mmHG)  +----------+--------+-------+----------------+-------------------+ [id number]            Multiphasic, [id number]                 +----------+--------+-------+----------------+-------------------+ +---------+--------+--+--------+--+---------+ VertebralPSV cm/s56EDV cm/s16Antegrade +---------+--------+--+--------+--+---------+  Left Carotid Findings: +----------+--------+--------+--------+------------------+------------------+           PSV cm/sEDV cm/sStenosisPlaque DescriptionComments           +----------+--------+--------+--------+------------------+------------------+ CCA Prox  125     28                                                   +----------+--------+--------+--------+------------------+------------------+  CCA Distal109     32                                intimal thickening +----------+--------+--------+--------+------------------+------------------+ ICA Prox  53      18                                                   +----------+--------+--------+--------+------------------+------------------+ ICA Mid   68      24                                                   +----------+--------+--------+--------+------------------+------------------+ ICA Distal72      20                                                   +----------+--------+--------+--------+------------------+------------------+ ECA       110     16                                                   +----------+--------+--------+--------+------------------+------------------+ +----------+--------+--------+----------------+-------------------+           PSV cm/sEDV cm/sDescribe        Arm Pressure (mmHG) +----------+--------+--------+----------------+-------------------+ [id number]             Multiphasic, [id number]                 +----------+--------+--------+----------------+-------------------+ +---------+--------+--+--------+--+---------+ VertebralPSV cm/s38EDV  cm/s14Antegrade +---------+--------+--+--------+--+---------+   Summary: Right Carotid: The extracranial vessels were near-normal with only minimal wall                thickening or plaque. Left Carotid: The extracranial vessels were near-normal with only minimal wall               thickening or plaque. Vertebrals:  Bilateral vertebral arteries demonstrate antegrade flow. Subclavians: Normal flow hemodynamics were seen in bilateral subclavian              arteries. *See table(s) above for measurements and observations.     Preliminary      Assessment and Plan:   1. Chest pain Atypical symptoms and likely related to heart racing.  Troponin flat, not consistent with ACS.  Cardiac catheterization in 2019 with mild nonobstructive CAD. Patient does not warrant inpatient ischemic evaluation.  2.  Nonischemic cardiomyopathy/combined CHF -Most recent echocardiogram in March 2022 stable LV function and grade 1 diastolic dysfunction -Patient is not volume overloaded on exam -Continue home Toprol-XL 200 mg daily and Entresto 97/103 mg twice daily  3.  Heart racing/PVC -Patient reported 3 to 4 hours of heart racing prior to coming to ER.  This could have precipitated his chest pain.  Patient had tachycardia prior to diagnosis of cardiomyopathy in 2019.  Reports palpitation was controlled on Toprol-XL.  However, current palpitation was after a long time. -Review of telemetry showed mostly sinus rhythm with PVCs -No  tachyarrhythmia noted -TSH normal -Recent echo as above -Continue Toprol-XL 200 mg daily   Risk Assessment/Risk Scores:    New York Heart Association (NYHA) Functional Class NYHA Class I For questions or updates, please contact [facility] Please consult [email or web address] for contact info under    Signed, Leanor Kail, PA  04/27/2020 1:20 PM  Personally seen and examined. Agree with APP above with the following comments or changes made above: Briefly 55 yo M with NICM  improved on GDMT, and non-obstructive CAD s/p LHC 2019 who presents with CP associated with tachycardia. Patient notes that he pulled an all-night for work; during this tim he had three to four hours of chest racing with eventual <10 minutes of dull CP.  Did no change his caffeine intake but does not remember if he took his metoprolol.  Patient notes an episode where he was in [place] and had run out of his metoprolol; while awaiting his prescription had heart racing to 114.  During this episode his heart was was as high as 143, though this went down by ED evaluation.  Notes gas which is chronic and unchanged  Exam notable for small skin rash on left lower ankle.  Otherwise WNL Labs notable for novel troponin < 100 X5. Personally reviewed relevant tests; telemetry at [facility] has shown occasional PVCs (two during discussion with patient) Would recommend  - continuing his HF medication as above; discussed minimizing caffeine - discussed at length with patient:  we do not have a clear etiology for his cardiomyopathy, if he were to have frequent PVCs (reported at [facility]) despite being on BB; this would required repeat structural eval (ehco vs CMR) and possible EP evaluation.  Patient is symptomatic of these events and HD stable during them. No NSVT.  A non live heart monitor would be appropriate for PVC burden. - Patient has moderate nonobstructive CAD and an LDL of 167 without known statin myopathy; would continue ASA and statin; fasting Lipids and LFTs in three months  Discussed with patient and primary MD.  Monitor to be mailed to patient.  Rudean Haskell, MD [address], [address] [place], [place] [address] [phone]  2:58 PM

## 2020-04-27 NOTE — Discharge Summary (Signed)
Physician Discharge Summary   Bryan English. RAQ:[id number] DOB: 01-31-1965 DOA: 04/26/2020  PCP: Haydee Salter, MD  Admit date: 04/26/2020 Discharge date: 04/27/2020   Admitted From: home Disposition:  home Discharging physician: Dwyane Dee, MD  Recommendations for Outpatient Follow-up:  1. Follow up with cardiology; heart monitor arranged on discharge  Patient discharged to homes in Discharge Condition: stable Risk of unplanned readmission score:    CODE STATUS: Full Diet recommendation:  Diet Orders (From admission, onward)    Start     Ordered   04/27/20 1349  Diet Heart Room service appropriate? Yes; Fluid consistency: Thin  Diet effective now       Question Answer Comment  Room service appropriate? Yes   Fluid consistency: Thin      04/27/20 1349   04/27/20 0000  Diet - low sodium heart healthy        04/27/20 1433          Hospital Course: Bryan English is a 55 year old male with PMH nonobstructive CAD (last cath 2019), hypertension, hyperlipidemia, diabetes, GERD who presented to the hospital with chest pain 4/10 that lasted briefly (less than a minute).  Episode occurred after being up all night working on a project and upon laying down he developed the pain.  It resolved spontaneously after it occurred. He presented to the ER for further evaluation.  D-dimer was mildly elevated and he underwent CTA chest which was negative for PE.  Troponins were trended and overall did not become significantly elevated. He was noted to have PVCs on EKG and telemetry during monitoring.  He has a history of his "heart racing" at times.  Etiology of his presentation was not felt to be ischemic in nature; cardiology is in agreement.  He was arranged to have a cardiac monitor at discharge.  He was not on aspirin nor a statin prior to admission and both were prescribed upon discharge as well (LDL 167), and as noted some mild nonobstructive CAD on prior cath.  Symptoms had not  recurred during hospitalization and he felt well during monitoring and prior to discharge. He was discharged home in stable condition and will follow up outpatient with cardiology to review heart monitor results.   The patient's chronic medical conditions were treated accordingly per the patient's home medication regimen except as noted.  On day of discharge, patient was felt deemed stable for discharge. Patient/family member advised to call PCP or come back to ER if needed.   Principal Diagnosis: Chest pain  Discharge Diagnoses: Active Hospital Problems   Diagnosis Date Noted  . Hyperlipidemia associated with type 2 diabetes mellitus ([place]) 03/27/2020  . Psoriasis 03/27/2020  . Class 2 severe obesity with serious comorbidity and body mass index (BMI) of 38.0 to 38.9 in adult [facility]) 06/12/2018  . Type 2 diabetes mellitus with hyperglycemia, without long-term current use of insulin ([place]) 06/12/2018  . Nonischemic cardiomyopathy ([place]) 10/27/2017  . 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 Essential hypertension 10/14/2017    Resolved Hospital Problems   Diagnosis Date Noted Date Resolved  . Chest pain 04/26/2020 04/27/2020    Discharge Instructions    Diet - low sodium heart healthy   Complete by: As directed    Increase activity slowly   Complete by: As directed      Allergies as of 04/27/2020   No Known Allergies     Medication List    TAKE these medications   aspirin 81 MG EC tablet Take  1 tablet (81 mg total) by mouth daily. Swallow whole. Start taking on: April 28, 2020   atorvastatin 40 MG tablet Commonly known as: Lipitor Take 1 tablet (40 mg total) by mouth daily.   clobetasol cream 0.05 % Commonly known as: TEMOVATE Apply 1 application topically 2 (two) times daily.   empagliflozin 10 MG Tabs tablet Commonly known as: Jardiance Take 1 tablet (10 mg total) by mouth daily before breakfast.   Entresto 97-103 MG Generic drug:  sacubitril-valsartan TAKE 1 TABLET BY MOUTH 2 (TWO) TIMES DAILY.   glucose blood test strip Commonly known as: True Metrix Blood Glucose Test Use as instructed to check blood sugars twice daily and as needed   metFORMIN 1000 MG tablet Commonly known as: GLUCOPHAGE TAKE 1 TABLET (1,000 MG TOTAL) BY MOUTH EVERY EVENING. What changed:   how much to take  how to take this  when to take this   metoprolol 200 MG 24 hr tablet Commonly known as: TOPROL-XL TAKE 1 TABLET (200 MG TOTAL) BY MOUTH DAILY TAKE WITH OR IMMEDIATELY FOLLOWING A MEAL. What changed:   how much to take  how to take this  when to take this   pantoprazole 40 MG tablet Commonly known as: PROTONIX Take 1 tablet (40 mg total) by mouth daily.   True Metrix Air Glucose Meter w/Device Kit 1 kit by Does not apply route 2 (two) times daily.   TRUEplus Lancets 28G Misc Use twice daily to check blood sugars and as needed       No Known Allergies  Consultations: Cardiology  Discharge Exam: BP 139/88 (BP Location: Right Arm)   Pulse 86   Temp 98.4 F (36.9 C) (Oral)   Resp 17   Ht _0  (1.727 m)   Wt 118.8 kg   SpO2 96%   BMI 39.84 kg/m  General appearance: alert, cooperative and no distress Head: Normocephalic, without obvious abnormality, atraumatic Eyes: EOMI Lungs: clear to auscultation bilaterally Heart: regular rate and rhythm and S1, S2 normal Abdomen: normal findings: bowel sounds normal and soft, non-tender Extremities: no edema\ Skin: mobility and turgor normal Neurologic: Grossly normal  The results of significant diagnostics from this hospitalization (including imaging, microbiology, ancillary and laboratory) are listed below for reference.   Microbiology: Recent Results (from the past 240 hour(s))  Resp Panel by RT-PCR (Flu A&B, Covid)     Status: None   Collection Time: 04/26/20  1:55 PM  Result Value Ref Range Status   SARS Coronavirus 2 by RT PCR NEGATIVE NEGATIVE Final     Comment: (NOTE) SARS-CoV-2 target nucleic acids are NOT DETECTED.  The SARS-CoV-2 RNA is generally detectable in upper respiratory specimens during the acute phase of infection. The lowest concentration of SARS-CoV-2 viral copies this assay can detect is 138 copies/mL. A negative result does not preclude SARS-Cov-2 infection and should not be used as the sole basis for treatment or other patient management decisions. A negative result may occur with  improper specimen collection/handling, submission of specimen other than nasopharyngeal swab, presence of viral mutation(s) within the areas targeted by this assay, and inadequate number of viral copies(<138 copies/mL). A negative result must be combined with clinical observations, patient history, and epidemiological information. The expected result is Negative.  Fact Sheet for Patients:  EntrepreneurPulse.com.au  Fact Sheet for Healthcare Providers:  IncredibleEmployment.be  This test is no t yet approved or cleared by the [place] FDA and  has been authorized for detection and/or diagnosis of SARS-CoV-2 by FDA  under an Emergency Use Authorization (EUA). This EUA will remain  in effect (meaning this test can be used) for the duration of the COVID-19 declaration under Section 564(b)(1) of the Act, 21 U.S.C.section 360bbb-3(b)(1), unless the authorization is terminated  or revoked sooner.       Influenza A by PCR NEGATIVE NEGATIVE Final   Influenza B by PCR NEGATIVE NEGATIVE Final    Comment: (NOTE) The Xpert Xpress SARS-CoV-2/FLU/RSV plus assay is intended as an aid in the diagnosis of influenza from Nasopharyngeal swab specimens and should not be used as a sole basis for treatment. Nasal washings and aspirates are unacceptable for Xpert Xpress SARS-CoV-2/FLU/RSV testing.  Fact Sheet for Patients: EntrepreneurPulse.com.au  Fact Sheet for Healthcare  Providers: IncredibleEmployment.be  This test is not yet approved or cleared by the [place] FDA and has been authorized for detection and/or diagnosis of SARS-CoV-2 by FDA under an Emergency Use Authorization (EUA). This EUA will remain in effect (meaning this test can be used) for the duration of the COVID-19 declaration under Section 564(b)(1) of the Act, 21 U.S.C. section 360bbb-3(b)(1), unless the authorization is terminated or revoked.  Performed at [facility], [place] [address], [place], [place] [address]      Labs: BNP (last 3 results) No results for input(s): BNP in the last 8760 hours. Basic Metabolic Panel: Recent Labs  Lab 04/26/20 0824 04/26/20 1726  NA 139  --   K 3.5  --   CL 104  --   CO2 21*  --   GLUCOSE 175*  --   BUN 16  --   CREATININE 1.22  --   CALCIUM 9.4  --   MG  --  2.2  PHOS  --  3.3   Liver Function Tests: Recent Labs  Lab 04/26/20 0824  AST 21  ALT 19  ALKPHOS 75  BILITOT 0.5  PROT 8.8*  ALBUMIN 3.9   No results for input(s): LIPASE, AMYLASE in the last 168 hours. No results for input(s): AMMONIA in the last 168 hours. CBC: Recent Labs  Lab 04/26/20 0824  WBC 12.1*  NEUTROABS 6.4  HGB 14.7  HCT 46.2  MCV 79.9*  PLT 351   Cardiac Enzymes: No results for input(s): CKTOTAL, CKMB, CKMBINDEX, TROPONINI in the last 168 hours. BNP: Invalid input(s): POCBNP CBG: Recent Labs  Lab 04/27/20 0922  GLUCAP 152*   D-Dimer Recent Labs    04/26/20 0824  DDIMER 0.68*   Hgb A1c No results for input(s): HGBA1C in the last 72 hours. Lipid Profile Recent Labs    04/27/20 0132  CHOL 217*  HDL 26*  LDLCALC 167*  TRIG 118  CHOLHDL 8.3   Thyroid function studies Recent Labs    04/27/20 0818  TSH 2.392   Anemia work up No results for input(s): VITAMINB12, FOLATE, FERRITIN, TIBC, IRON, RETICCTPCT in the last 72 hours. Urinalysis    Component Value Date/Time   COLORURINE YELLOW 03/27/2020  1522   APPEARANCEUR Cloudy (A) 03/27/2020 1522   LABSPEC 1.025 03/27/2020 1522   PHURINE 6.0 03/27/2020 1522   GLUCOSEU NEGATIVE 03/27/2020 1522   HGBUR TRACE-INTACT (A) 03/27/2020 1522   BILIRUBINUR NEGATIVE 03/27/2020 1522   BILIRUBINUR large (A) 11/24/2017 1145   KETONESUR NEGATIVE 03/27/2020 1522   PROTEINUR NEGATIVE 04/05/2017 2028   UROBILINOGEN 0.2 03/27/2020 1522   NITRITE NEGATIVE 03/27/2020 1522   LEUKOCYTESUR NEGATIVE 03/27/2020 1522   Sepsis Labs Invalid input(s): PROCALCITONIN,  WBC,  LACTICIDVEN Microbiology Recent Results (from the past  240 hour(s))  Resp Panel by RT-PCR (Flu A&B, Covid)     Status: None   Collection Time: 04/26/20  1:55 PM  Result Value Ref Range Status   SARS Coronavirus 2 by RT PCR NEGATIVE NEGATIVE Final    Comment: (NOTE) SARS-CoV-2 target nucleic acids are NOT DETECTED.  The SARS-CoV-2 RNA is generally detectable in upper respiratory specimens during the acute phase of infection. The lowest concentration of SARS-CoV-2 viral copies this assay can detect is 138 copies/mL. A negative result does not preclude SARS-Cov-2 infection and should not be used as the sole basis for treatment or other patient management decisions. A negative result may occur with  improper specimen collection/handling, submission of specimen other than nasopharyngeal swab, presence of viral mutation(s) within the areas targeted by this assay, and inadequate number of viral copies(<138 copies/mL). A negative result must be combined with clinical observations, patient history, and epidemiological information. The expected result is Negative.  Fact Sheet for Patients:  EntrepreneurPulse.com.au  Fact Sheet for Healthcare Providers:  IncredibleEmployment.be  This test is no t yet approved or cleared by the [place] FDA and  has been authorized for detection and/or diagnosis of SARS-CoV-2 by FDA under an Emergency Use  Authorization (EUA). This EUA will remain  in effect (meaning this test can be used) for the duration of the COVID-19 declaration under Section 564(b)(1) of the Act, 21 U.S.C.section 360bbb-3(b)(1), unless the authorization is terminated  or revoked sooner.       Influenza A by PCR NEGATIVE NEGATIVE Final   Influenza B by PCR NEGATIVE NEGATIVE Final    Comment: (NOTE) The Xpert Xpress SARS-CoV-2/FLU/RSV plus assay is intended as an aid in the diagnosis of influenza from Nasopharyngeal swab specimens and should not be used as a sole basis for treatment. Nasal washings and aspirates are unacceptable for Xpert Xpress SARS-CoV-2/FLU/RSV testing.  Fact Sheet for Patients: EntrepreneurPulse.com.au  Fact Sheet for Healthcare Providers: IncredibleEmployment.be  This test is not yet approved or cleared by the [place] FDA and has been authorized for detection and/or diagnosis of SARS-CoV-2 by FDA under an Emergency Use Authorization (EUA). This EUA will remain in effect (meaning this test can be used) for the duration of the COVID-19 declaration under Section 564(b)(1) of the Act, 21 U.S.C. section 360bbb-3(b)(1), unless the authorization is terminated or revoked.  Performed at [facility], [place] [address], [place], [place] [address]     Procedures/Studies: CT Angio Chest PE W and/or Wo Contrast  Result Date: 04/26/2020 CLINICAL DATA:  Chest pain and elevated D-dimer. EXAM: CT ANGIOGRAPHY CHEST WITH CONTRAST TECHNIQUE: Multidetector CT imaging of the chest was performed using the standard protocol during bolus administration of intravenous contrast. Multiplanar CT image reconstructions and MIPs were obtained to evaluate the vascular anatomy. CONTRAST:  125m OMNIPAQUE IOHEXOL 350 MG/ML SOLN COMPARISON:  Prior CTA of the chest on 09/08/2017 FINDINGS: Cardiovascular: The pulmonary arteries are adequately opacified. There is no evidence of  pulmonary embolism. The heart size is within normal limits. No pericardial fluid identified. Stable appearance of calcified plaque in the distribution of the LAD. Central pulmonary arteries are normal in caliber. The thoracic aorta is normal in caliber. Aortic opacification sufficient to exclude aortic dissection. Mediastinum/Nodes: No enlarged mediastinal, hilar, or axillary lymph nodes. Thyroid gland, trachea, and esophagus demonstrate no significant findings. Lungs/Pleura: There is no evidence of pulmonary edema, consolidation, pneumothorax, nodule or pleural fluid. Previously noted focal sub-solid opacity in the superior segment of the left lower lobe has  resolved. Upper Abdomen: No acute abnormality. Musculoskeletal: No chest wall abnormality. No acute or significant osseous findings. Review of the MIP images confirms the above findings. IMPRESSION: 1. No evidence of pulmonary embolism or other acute findings in the chest. 2. Stable appearance of calcified plaque in the distribution of the LAD. Electronically Signed   By: Aletta Edouard M.D.   On: 04/26/2020 09:55   DG Chest Portable 1 View  Result Date: 04/26/2020 CLINICAL DATA:  Mid sternal chest pain EXAM: PORTABLE CHEST 1 VIEW COMPARISON:  10/27/2017 FINDINGS: The heart size and mediastinal contours are within normal limits. Both lungs are clear. The visualized skeletal structures are unremarkable. IMPRESSION: Normal study. Electronically Signed   By: Rolm Baptise M.D.   On: 04/26/2020 09:08   VAS US CAROTID  Result Date: 04/27/2020 Carotid Arterial Duplex Study Indications:       Bilateral bruits and Patient denies any cerebrovascular                    symptoms today. Risk Factors:      Hypertension, hyperlipidemia, Diabetes, no history of                    smoking, coronary artery disease. Limitations        Today's exam was limited due to the body habitus of the                    patient. Comparison Study:  None Performing Technologist:  Alecia Mackin RVT, RDCS (AE), RDMS  Examination Guidelines: A complete evaluation includes B-mode imaging, spectral Doppler, color Doppler, and power Doppler as needed of all accessible portions of each vessel. Bilateral testing is considered an integral part of a complete examination. Limited examinations for reoccurring indications may be performed as noted.  Right Carotid Findings: +----------+--------+--------+--------+------------------+------------------+           PSV cm/sEDV cm/sStenosisPlaque DescriptionComments           +----------+--------+--------+--------+------------------+------------------+ CCA Prox  78      16                                                   +----------+--------+--------+--------+------------------+------------------+ CCA Distal82      21                                intimal thickening +----------+--------+--------+--------+------------------+------------------+ ICA Prox  63      18                                                   +----------+--------+--------+--------+------------------+------------------+ ICA Mid   75      29                                                   +----------+--------+--------+--------+------------------+------------------+ ICA Distal67      29                                                   +----------+--------+--------+--------+------------------+------------------+  ECA       112     14                                                   +----------+--------+--------+--------+------------------+------------------+ +----------+--------+-------+----------------+-------------------+           PSV cm/sEDV cmsDescribe        Arm Pressure (mmHG) +----------+--------+-------+----------------+-------------------+ [id number]            Multiphasic, [id number]                 +----------+--------+-------+----------------+-------------------+ +---------+--------+--+--------+--+---------+  VertebralPSV cm/s56EDV cm/s16Antegrade +---------+--------+--+--------+--+---------+  Left Carotid Findings: +----------+--------+--------+--------+------------------+------------------+           PSV cm/sEDV cm/sStenosisPlaque DescriptionComments           +----------+--------+--------+--------+------------------+------------------+ CCA Prox  125     28                                                   +----------+--------+--------+--------+------------------+------------------+ CCA Distal109     32                                intimal thickening +----------+--------+--------+--------+------------------+------------------+ ICA Prox  53      18                                                   +----------+--------+--------+--------+------------------+------------------+ ICA Mid   68      24                                                   +----------+--------+--------+--------+------------------+------------------+ ICA Distal72      20                                                   +----------+--------+--------+--------+------------------+------------------+ ECA       110     16                                                   +----------+--------+--------+--------+------------------+------------------+ +----------+--------+--------+----------------+-------------------+           PSV cm/sEDV cm/sDescribe        Arm Pressure (mmHG) +----------+--------+--------+----------------+-------------------+ [id number]             Multiphasic, [id number]                 +----------+--------+--------+----------------+-------------------+ +---------+--------+--+--------+--+---------+ VertebralPSV cm/s38EDV cm/s14Antegrade +---------+--------+--+--------+--+---------+   Summary: Right Carotid: The extracranial vessels were near-normal with only minimal wall                thickening or plaque. Left Carotid: The  extracranial vessels were near-normal  with only minimal wall               thickening or plaque. Vertebrals:  Bilateral vertebral arteries demonstrate antegrade flow. Subclavians: Normal flow hemodynamics were seen in bilateral subclavian              arteries. *See table(s) above for measurements and observations.  Electronically signed by Carlyle Dolly MD on 04/27/2020 at 2:14:33 PM.    Final      Time coordinating discharge: Over 78 minutes    Dwyane Dee, MD  [facility] 04/27/2020, 5:57 PM

## 2020-05-03 ENCOUNTER — Other Ambulatory Visit: Payer: Self-pay | Admitting: Cardiology

## 2020-05-03 ENCOUNTER — Other Ambulatory Visit: Payer: Self-pay

## 2020-05-03 MED ORDER — METOPROLOL SUCCINATE ER 200 MG PO TB24
ORAL_TABLET | ORAL | 9 refills | Status: DC
  Filled 2020-05-03: qty 30, 30d supply, fill #0
  Filled 2020-06-02: qty 30, 30d supply, fill #1
  Filled 2020-07-04: qty 30, 30d supply, fill #2
  Filled 2020-08-02: qty 30, 30d supply, fill #3
  Filled 2020-09-01: qty 30, 30d supply, fill #4
  Filled 2020-10-03: qty 30, 30d supply, fill #5
  Filled 2020-11-02: qty 30, 30d supply, fill #6
  Filled 2020-12-04: qty 30, 30d supply, fill #7
  Filled 2021-01-02: qty 30, 30d supply, fill #8
  Filled 2021-02-02: qty 30, 30d supply, fill #9
  Filled 2021-02-02: qty 30, 30d supply, fill #0

## 2020-05-04 ENCOUNTER — Other Ambulatory Visit: Payer: Self-pay

## 2020-05-04 DIAGNOSIS — I493 Ventricular premature depolarization: Secondary | ICD-10-CM

## 2020-05-04 DIAGNOSIS — R002 Palpitations: Secondary | ICD-10-CM

## 2020-05-08 ENCOUNTER — Ambulatory Visit (INDEPENDENT_AMBULATORY_CARE_PROVIDER_SITE_OTHER): Payer: 59 | Admitting: Family Medicine

## 2020-05-08 ENCOUNTER — Other Ambulatory Visit: Payer: Self-pay

## 2020-05-08 ENCOUNTER — Encounter: Payer: Self-pay | Admitting: Family Medicine

## 2020-05-08 VITALS — BP 148/90 | HR 78 | Temp 97.5°F | Ht 68.0 in | Wt 260.0 lb

## 2020-05-08 DIAGNOSIS — Z6838 Body mass index (BMI) 38.0-38.9, adult: Secondary | ICD-10-CM

## 2020-05-08 DIAGNOSIS — Z86010 Personal history of colonic polyps: Secondary | ICD-10-CM | POA: Insufficient documentation

## 2020-05-08 DIAGNOSIS — E785 Hyperlipidemia, unspecified: Secondary | ICD-10-CM

## 2020-05-08 DIAGNOSIS — E1169 Type 2 diabetes mellitus with other specified complication: Secondary | ICD-10-CM

## 2020-05-08 DIAGNOSIS — I428 Other cardiomyopathies: Secondary | ICD-10-CM

## 2020-05-08 DIAGNOSIS — E1165 Type 2 diabetes mellitus with hyperglycemia: Secondary | ICD-10-CM | POA: Diagnosis not present

## 2020-05-08 DIAGNOSIS — I10 Essential (primary) hypertension: Secondary | ICD-10-CM | POA: Diagnosis not present

## 2020-05-08 NOTE — Progress Notes (Signed)
[facility] [address] [place] [address] Dept: [phone] Dept Fax: [phone]  Office Visit  Subjective:    Patient ID: Bryan Greet., male    DOB: 10/09/1965, 55 y.o..   MRN: [id number]  Chief Complaint  Patient presents with  . Hospitalization Follow-up    Hospital f/u for chest pain on 04/26/20.     History of Present Illness:  Patient is in today for hospital follow-up. Bryan English was admitted at [facility] overnight on 4/13-4/14/2022 with chest pain. He notes this occurred 1-2 days after his colonoscopy. He feels that anesthetic from the procedure may have contributed to this. He had negative enzymes and a no sing of PE on a CT angiogram, though it did show some calcified plaque in the LAD distribution. Bryan English was discharged with the addition of aspiring and atorvastatin 40 mg daily added to his therapy. Bryan English did start taking the aspirin, but as of yet had not started the atorvastatin. He was seen by cardiology and has Zio-XT heart monitor in place for two weeks. He notes that he is feeling well since returning home.  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 He observed the gradual escalation of medicines that people had as they aged and he does not want to repeat this pattern. In discussing ways to avoid this, primarily through weight loss, Bryan English notes that he would like to lose 60 lbs. Prior to his episode of chest pain, he had been going to the gym doing weight training. He also has been walking, noting that he walks about 1 mile in 20 min twice a day. He has also made dietary changes focused on decreasing carbs.  Past Medical History: Patient Active Problem List   Diagnosis Date Noted  . History of colon polyps 05/08/2020  . Hyperlipidemia associated with type 2 diabetes mellitus ([place]) 03/27/2020  . Psoriasis 03/27/2020   . Type 2 diabetes mellitus with hyperglycemia, without long-term current use of insulin ([place]) 06/12/2018  . Class 2 severe obesity with serious comorbidity and body mass index (BMI) of 38.0 to 38.9 in adult [facility]) 06/12/2018  . Nonischemic cardiomyopathy ([place]) 10/27/2017  . 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 Essential hypertension 10/14/2017   Past Surgical History:  Procedure Laterality Date  . HAND SURGERY    . LEFT HEART CATH AND CORONARY ANGIOGRAPHY N/A 10/29/2017   Procedure: LEFT HEART CATH AND CORONARY ANGIOGRAPHY;  Surgeon: [name], MD;  Location: [facility];  Service: Cardiovascular;  Laterality: N/A;   Family History  Adopted: Yes  Problem Relation Age of Onset  . Colon cancer Neg Hx   . Colon polyps Neg Hx   . Esophageal cancer Neg Hx   . Rectal cancer Neg Hx   . Stomach cancer Neg Hx    Outpatient Medications Prior to Visit  Medication Sig Dispense Refill  . aspirin EC 81 MG EC tablet Take 1 tablet (81 mg total) by mouth daily. Swallow whole. 30 tablet 11  . atorvastatin (LIPITOR) 40 MG tablet Take 1 tablet (40 mg total) by mouth daily. 30 tablet 3  . Blood Glucose Monitoring Suppl (TRUE METRIX AIR GLUCOSE METER) w/Device KIT 1 kit by Does not apply route 2 (two) times daily. 1 kit 0  . clobetasol cream (TEMOVATE) 4.16 % Apply 1 application topically 2 (two) times daily. 60 g prn  . empagliflozin (JARDIANCE)  10 MG TABS tablet Take 1 tablet (10 mg total) by mouth daily before breakfast. 30 tablet 6  . glucose blood (TRUE METRIX BLOOD GLUCOSE TEST) test strip Use as instructed to check blood sugars twice daily and as needed 100 each 3  . metFORMIN (GLUCOPHAGE) 1000 MG tablet TAKE 1 TABLET (1,000 MG TOTAL) BY MOUTH EVERY EVENING. (Patient taking differently: Take 1,000 mg by mouth every evening.) 90 tablet 1  . metoprolol (TOPROL-XL) 200 MG 24 hr tablet TAKE 1 TABLET (200 MG TOTAL) BY MOUTH DAILY TAKE WITH OR IMMEDIATELY FOLLOWING A MEAL. 30  tablet 9  . pantoprazole (PROTONIX) 40 MG tablet Take 1 tablet (40 mg total) by mouth daily. 90 tablet 4  . sacubitril-valsartan (ENTRESTO) 97-103 MG TAKE 1 TABLET BY MOUTH 2 (TWO) TIMES DAILY. 60 tablet 6  . TRUEPLUS LANCETS 28G MISC Use twice daily to check blood sugars and as needed 100 each 3   No facility-administered medications prior to visit.   No Known Allergies    Objective:   Today's Vitals   05/08/20 0906  BP: (!) 148/90  Pulse: 78  Temp: (!) 97.5 F (36.4 C)  TempSrc: Temporal  SpO2: 95%  Weight: 260 lb (117.9 kg)  Height: _0  (1.727 m)   Body mass index is 39.53 kg/m.   General: Well developed, well nourished. No acute distress. Psych: Alert and oriented x3. Normal mood and affect.  Health Maintenance Due  Topic Date Due  . Hepatitis C Screening  Never done  . PNEUMOCOCCAL POLYSACCHARIDE VACCINE AGE 14-64 HIGH RISK  Never done  . FOOT EXAM  Never done  . OPHTHALMOLOGY EXAM  Never done  . TETANUS/TDAP  Never done     Assessment & Plan:   1. Nonischemic cardiomyopathy [facility]) Reviewed hospital discharge summary, lab testing, and CT angiogram results. Bryan English cardiomyopathy is stable without evidence of decompensation.  2. Essential hypertension Blood pressure in high in the office, but reportedly 117-125/77-85 at home. Bryan English notes his home blood pressure cuff has been compared to the in-office testing at his cardiologist and is accurate. We will continue his current therapy./  3. Hyperlipidemia associated with type 2 diabetes mellitus ([place]) We discussed his ACC/AHA cardiac risk indicating a 25% risk of a major CV event in the next 10 years. Althoguh diet and exercise with weight loss could normalize these findings, I recommended he start the medication. If he is able to meet goals with weight loss we could consider a trial off meds.  4. Type 2 diabetes mellitus with hyperglycemia, without long-term current use of insulin (HCC) At goal.  5.  Class 2 severe obesity with serious comorbidity and body mass index (BMI) of 38.0 to 38.9 in adult, unspecified obesity type [facility]) Bryan English stated plan for diet and exercise are reasonable. He also is on Trulicity for DM, which could help with his weight loss. His weight is down 8 lbs from his last visit.  6. History of colon polyps Reviewed colonoscopy report and biopsy results. Showed tubular adenoma. Will be due for repeat colonoscopy in 5 years.  Haydee Salter, MD

## 2020-05-09 ENCOUNTER — Encounter: Payer: Self-pay | Admitting: Gastroenterology

## 2020-05-15 ENCOUNTER — Other Ambulatory Visit: Payer: Self-pay

## 2020-05-15 ENCOUNTER — Other Ambulatory Visit: Payer: Self-pay | Admitting: Family Medicine

## 2020-05-15 DIAGNOSIS — E1165 Type 2 diabetes mellitus with hyperglycemia: Secondary | ICD-10-CM

## 2020-05-16 ENCOUNTER — Telehealth: Payer: Self-pay | Admitting: Family Medicine

## 2020-05-16 DIAGNOSIS — E1165 Type 2 diabetes mellitus with hyperglycemia: Secondary | ICD-10-CM

## 2020-05-16 MED ORDER — TRUE METRIX BLOOD GLUCOSE TEST VI STRP
ORAL_STRIP | 3 refills | Status: DC
  Filled 2020-05-16: qty 100, 50d supply, fill #0

## 2020-05-16 NOTE — Addendum Note (Signed)
Addended by: Konrad Saha on: 05/16/2020 05:02 PM   Modules accepted: Orders

## 2020-05-16 NOTE — Telephone Encounter (Signed)
lft VM to clarify the correct test strips before sending them.  Dm/cma  

## 2020-05-16 NOTE — Telephone Encounter (Signed)
lft VM to clarify the correct test strips before sending them.  Dm/cma

## 2020-05-16 NOTE — Telephone Encounter (Signed)
Patient is calling to get a prescription for test strips (prescribed by previous provider). If approved, please send to [facility] on [address] and call patient at [phone].

## 2020-05-16 NOTE — Telephone Encounter (Signed)
Pt called back. He uses True Metrix test strips 50ct Requesting 90 day supply

## 2020-05-16 NOTE — Telephone Encounter (Signed)
rx sent to the pharmacy. Dm/cma ° °

## 2020-05-17 ENCOUNTER — Other Ambulatory Visit: Payer: Self-pay

## 2020-05-24 ENCOUNTER — Other Ambulatory Visit: Payer: Self-pay

## 2020-06-02 ENCOUNTER — Other Ambulatory Visit: Payer: Self-pay

## 2020-06-09 ENCOUNTER — Ambulatory Visit: Payer: 59 | Admitting: Cardiology

## 2020-06-21 ENCOUNTER — Other Ambulatory Visit: Payer: Self-pay

## 2020-06-22 ENCOUNTER — Emergency Department
Admission: EM | Admit: 2020-06-22 | Discharge: 2020-06-22 | Disposition: A | Discharge status: Home / Self Care | Payer: 59 | Source: Home / Self Care

## 2020-06-22 ENCOUNTER — Emergency Department (INDEPENDENT_AMBULATORY_CARE_PROVIDER_SITE_OTHER): Payer: 59

## 2020-06-22 DIAGNOSIS — R39198 Other difficulties with micturition: Secondary | ICD-10-CM | POA: Diagnosis not present

## 2020-06-22 DIAGNOSIS — M5442 Lumbago with sciatica, left side: Secondary | ICD-10-CM

## 2020-06-22 DIAGNOSIS — M62830 Muscle spasm of back: Secondary | ICD-10-CM | POA: Diagnosis not present

## 2020-06-22 DIAGNOSIS — R1032 Left lower quadrant pain: Secondary | ICD-10-CM

## 2020-06-22 DIAGNOSIS — M545 Low back pain, unspecified: Secondary | ICD-10-CM | POA: Diagnosis not present

## 2020-06-22 LAB — POCT URINALYSIS DIP (MANUAL ENTRY)
Bilirubin, UA: NEGATIVE
Glucose, UA: NEGATIVE mg/dL
Ketones, POC UA: NEGATIVE mg/dL
Leukocytes, UA: NEGATIVE
Nitrite, UA: NEGATIVE
Protein Ur, POC: NEGATIVE mg/dL
Spec Grav, UA: >=1.03 — AB (ref 1.010–1.025)
Urobilinogen, UA: 0.2 E.U./dL
pH, UA: 6 (ref 5.0–8.0)

## 2020-06-22 MED ORDER — BACLOFEN 10 MG PO TABS: 10.0000 mg | ORAL_TABLET | Freq: Three times a day (TID) | ORAL | 0 refills | Status: DC

## 2020-06-22 MED ORDER — METHYLPREDNISOLONE ACETATE 80 MG/ML IJ SUSP
80.0000 mg | Freq: Once | INTRAMUSCULAR | Status: AC
  Administered 2020-06-22: 80 mg via INTRAMUSCULAR

## 2020-06-22 MED ORDER — METHYLPREDNISOLONE 4 MG PO TBPK: ORAL_TABLET | ORAL | 0 refills | Status: DC

## 2020-06-22 NOTE — ED Triage Notes (Signed)
Pt presents to Urgent Care with c/o intermittent left lower back pain x 1 week and LLQ pain since yesterday. Pt also reporting that "something is not quite right" with urination, although denies dysuria and frequency.

## 2020-06-22 NOTE — Discharge Instructions (Addendum)
Advised patient not to start Medrol Dosepak until tomorrow, Friday, 06/23/2020.  Advised patient may use baclofen daily, or as needed.  Advised patient we will follow-up with urine culture results once received.  Advised patient if abdominal pain worsens and/or unresolved please follow-up with PCP or here for further evaluation.
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
## 2020-06-24 LAB — URINE CULTURE
MICRO NUMBER:: 11990609
SPECIMEN QUALITY:: ADEQUATE

## 2020-06-26 ENCOUNTER — Telehealth: Payer: Self-pay | Admitting: Emergency Medicine

## 2020-06-26 NOTE — Telephone Encounter (Signed)
Patient called about urine culture - labs reviewed w/ provider here today ( S.Matthews, NP) No antibiotic needed - pt to increase water intake.No other questions at this time

## 2020-07-03 NOTE — Progress Notes (Addendum)
Cardiology Office Note   Date:  07/04/2020   ID:  Bryan Greet., DOB [birth year], MRN [id number]  PCP:  Haydee Salter, MD  Cardiologist:   None   Chief Complaint  Patient presents with   Back Pain       History of Present Illness: Bryan English. is a 55 y.o. male who was referred by himself for evaluation of chest pain.    He had had multiple ED visits for chest pain. He was not found to have any evidence for ischemia.  In a previous visit in August he had had negative CT for PE.  He did have coronary calcification in the LAD.   When I saw him I sent him for an echo.  He was found to have an EF of 20%.  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  We have titrated his meds.    His EF in Jan was slightly better with at 25 - 30%.  He has had bradycardia.   With med titration his EF has improved and was 50% in March 2022.    At the last visit I did start Jardiance.   He called and he had some palpitations.  He wore a monitor earlier this month and had one 7 beat run of NSVT.  I reviewed to ER visits for this appointment.  He had some back pain which was ultimately not found to be a urinary tract infection but thought to be sciatica.  A couple of days ago he was in the emergency room with hypertensive urgency.    He doing okay.  He said that he was concerned initially when he presented to the ER because of questionable UTI with his newly started [place].   He was concerned when he saw his systolic blood pressure was 200.  He was getting ready to fly to [place] and was stressed about this.  He said that otherwise he has done well.  He is not yet back at the gym but he feels okay.  The patient denies any new symptoms such as chest discomfort, neck or arm discomfort. There has been no new shortness of breath, PND or orthopnea. There have been no reported palpitations, presyncope or syncope.    Past Medical History:  Diagnosis Date   Diabetes mellitus without  complication (HCC)    GERD (gastroesophageal reflux disease)    Hyperlipidemia    Hypertension    NICM (nonischemic cardiomyopathy) ([place]) 10/29/2017   EF 20% by echo, minimal CAD at cath    Past Surgical History:  Procedure Laterality Date   HAND SURGERY     LEFT HEART CATH AND CORONARY ANGIOGRAPHY N/A 10/29/2017   Procedure: LEFT HEART CATH AND CORONARY ANGIOGRAPHY;  Surgeon: [name], MD;  Location: [facility];  Service: Cardiovascular;  Laterality: N/A;     Current Outpatient Medications  Medication Sig Dispense Refill   atorvastatin (LIPITOR) 40 MG tablet Take 1 tablet (40 mg total) by mouth daily. 30 tablet 3   baclofen (LIORESAL) 10 MG tablet Take 1 tablet (10 mg total) by mouth 3 (three) times daily. 30 each 0   Blood Glucose Monitoring Suppl (TRUE METRIX AIR GLUCOSE METER) w/Device KIT 1 kit by Does not apply route 2 (two) times daily. 1 kit 0   clobetasol cream (TEMOVATE) 3.81 % Apply 1 application topically 2 (two) times daily. 60 g prn   empagliflozin (JARDIANCE) 10 MG TABS tablet Take 1 tablet (  10 mg total) by mouth daily before breakfast. 30 tablet 6   glucose blood (TRUE METRIX BLOOD GLUCOSE TEST) test strip Use as instructed to check blood sugars twice daily and as needed 100 each 3   hydrALAZINE (APRESOLINE) 10 MG tablet Take 1 tablet (10 mg total) by mouth every 8 (eight) hours as needed (for blood pressure greater than 509 systolic.). 30 tablet 3   metFORMIN (GLUCOPHAGE) 1000 MG tablet TAKE 1 TABLET (1,000 MG TOTAL) BY MOUTH EVERY EVENING. (Patient taking differently: Take 1,000 mg by mouth every evening.) 90 tablet 1   methylPREDNISolone (MEDROL DOSEPAK) 4 MG TBPK tablet Take as directed 1 each 0   metoprolol (TOPROL-XL) 200 MG 24 hr tablet TAKE 1 TABLET (200 MG TOTAL) BY MOUTH DAILY TAKE WITH OR IMMEDIATELY FOLLOWING A MEAL. 30 tablet 9   pantoprazole (PROTONIX) 40 MG tablet Take 1 tablet (40 mg total) by mouth daily. 90 tablet 4   sacubitril-valsartan  (ENTRESTO) 97-103 MG TAKE 1 TABLET BY MOUTH 2 (TWO) TIMES DAILY. 60 tablet 6   TRUEPLUS LANCETS 28G MISC Use twice daily to check blood sugars and as needed 100 each 3   No current facility-administered medications for this visit.    Allergies:   Patient has no known allergies.    ROS:  Please see the history of present illness.   Otherwise, review of systems are positive for none.   All other systems are reviewed and negative.    PHYSICAL EXAM: VS:  BP (!) 142/92 (BP Location: Left Arm, Patient Position: Sitting, Cuff Size: Normal)   Pulse 81   Resp 18   Ht $R'5\' 5"'Mj$  (1.651 m)   Wt 252 lb 12.8 oz (114.7 kg)   SpO2 97%   BMI 42.07 kg/m  , BMI Body mass index is 42.07 kg/m. GENERAL:  Well appearing NECK:  No jugular venous distention, waveform within normal limits, carotid upstroke brisk and symmetric, no bruits, no thyromegaly LUNGS:  Clear to auscultation bilaterally CHEST:  Unremarkable HEART:  PMI not displaced or sustained,S1 and S2 within normal limits, no S3, no S4, no clicks, no rubs, no murmurs ABD:  Flat, positive bowel sounds normal in frequency in pitch, no bruits, no rebound, no guarding, no midline pulsatile mass, no hepatomegaly, no splenomegaly EXT:  2 plus pulses throughout, no edema, no cyanosis no clubbing  EKG:  EKG is not ordered today.   Recent Labs: 04/26/2020: ALT 19; BUN 16; Creatinine, Ser 1.22; Hemoglobin 14.7; Magnesium 2.2; Platelets 351; Potassium 3.5; Sodium 139 04/27/2020: TSH 2.392    Lipid Panel    Component Value Date/Time   CHOL 217 (H) 04/27/2020 0132   CHOL 218 (H) 06/12/2018 1456   TRIG 118 04/27/2020 0132   HDL 26 (L) 04/27/2020 0132   HDL 31 (L) 06/12/2018 1456   CHOLHDL 8.3 04/27/2020 0132   VLDL 24 04/27/2020 0132   LDLCALC 167 (H) 04/27/2020 0132   LDLCALC 153 (H) 06/12/2018 1456      Wt Readings from Last 3 Encounters:  07/04/20 252 lb 12.8 oz (114.7 kg)  06/22/20 255 lb (115.7 kg)  05/08/20 260 lb (117.9 kg)      Other  studies Reviewed: Additional studies/ records that were reviewed today include: ED records Review of the above records demonstrates:     ASSESSMENT AND PLAN:   CARDIOMYOPATHY:    The patient's ejection fraction improved to 50%.  We will continue the meds as listed.  No change in therapy.  HTN:  His blood pressure is elevated mildly today.  However, I reviewed his electronic blood pressure diary and pressures are usually very well controlled.  He did not yet take his metoprolol.  No change in therapy.  10 mg q8 hours prn SBP greater than 170  DM: A1c was 6.6.  He will continue the meds as listed.   BRADYCARDIA: He had this previously but there were no sustained bradycardia arrhythmias on his monitor.  He had 1 run of nonsustained ventricular tachycardia but I do not think further management of this is necessary.  He is not having any new symptoms.  No change in therapy.  I reviewed the monitor results with him.    DYSLIPIDEMIA: His LDL was not at target.  I did start him on Lipitor about a month ago.  He has changed his diet.  We will repeat a lipid profile in 2 months.    OBESITY: I congratulated him on his weight loss.   Current medicines are reviewed at length with the patient today.  The patient does not have concerns regarding medicines.  The following changes have been made:   As above (of note I remove aspirin from his list as he was not taking it and he does only have minimal coronary plaque)  Labs/ tests ordered today include:      Orders Placed This Encounter  Procedures   Lipid panel      Disposition:   FU with me in 6 months. Ronnell [place], MD  07/04/2020 2:35 PM    [place]

## 2020-07-04 ENCOUNTER — Other Ambulatory Visit: Payer: Self-pay

## 2020-07-04 ENCOUNTER — Encounter: Payer: Self-pay | Admitting: Cardiology

## 2020-07-04 ENCOUNTER — Ambulatory Visit (INDEPENDENT_AMBULATORY_CARE_PROVIDER_SITE_OTHER): Payer: 59 | Admitting: Cardiology

## 2020-07-04 VITALS — BP 142/92 | HR 81 | Resp 18 | Ht 65.0 in | Wt 252.8 lb

## 2020-07-04 DIAGNOSIS — I10 Essential (primary) hypertension: Secondary | ICD-10-CM | POA: Diagnosis not present

## 2020-07-04 DIAGNOSIS — I428 Other cardiomyopathies: Secondary | ICD-10-CM

## 2020-07-04 DIAGNOSIS — E118 Type 2 diabetes mellitus with unspecified complications: Secondary | ICD-10-CM

## 2020-07-04 DIAGNOSIS — E785 Hyperlipidemia, unspecified: Secondary | ICD-10-CM | POA: Diagnosis not present

## 2020-07-04 MED ORDER — HYDRALAZINE HCL 10 MG PO TABS
10.0000 mg | ORAL_TABLET | Freq: Three times a day (TID) | ORAL | 3 refills | Status: DC | PRN
Start: 2020-07-04 — End: 2020-09-05

## 2020-07-04 NOTE — Patient Instructions (Signed)
Medication Instructions:  Take hydralazine 10mg  (1 tablet) every 8 hours as needed for blood pressures over 275 systolic (top number).  *If you need a refill on your cardiac medications before your next appointment, please call your pharmacy*   Lab Work: Lipid panel to be drawn FASTING in 2 months.   If you have labs (blood work) drawn today and your tests are completely normal, you will receive your results only by: [place] (if you have MyChart) OR A paper copy in the mail If you have any lab test that is abnormal or we need to change your treatment, we will call you to review the results.   Testing/Procedures: None ordered.    Follow-Up: At [facility], you and your health needs are our priority.  As part of our continuing mission to provide you with exceptional heart care, we have created designated Provider Care Teams.  These Care Teams include your primary Cardiologist (physician) and Advanced Practice Providers (APPs -  Physician Assistants and Nurse Practitioners) who all work together to provide you with the care you need, when you need it.  We recommend signing up for the patient portal called "MyChart".  Sign up information is provided on this After Visit Summary.  MyChart is used to connect with patients for Virtual Visits (Telemedicine).  Patients are able to view lab/test results, encounter notes, upcoming appointments, etc.  Non-urgent messages can be sent to your provider as well.   To learn more about what you can do with MyChart, go to [email or web address].    Your next appointment:   6 month(s)  The format for your next appointment:   In Person  Provider:   [name], MD

## 2020-08-02 ENCOUNTER — Other Ambulatory Visit: Payer: Self-pay

## 2020-09-01 ENCOUNTER — Other Ambulatory Visit: Payer: Self-pay

## 2020-09-04 ENCOUNTER — Other Ambulatory Visit: Payer: Self-pay

## 2020-09-05 ENCOUNTER — Ambulatory Visit (INDEPENDENT_AMBULATORY_CARE_PROVIDER_SITE_OTHER): Payer: 59 | Admitting: Family Medicine

## 2020-09-05 ENCOUNTER — Encounter: Payer: Self-pay | Admitting: Family Medicine

## 2020-09-05 VITALS — BP 168/94 | HR 61 | Temp 96.8°F | Ht 65.0 in | Wt 251.8 lb

## 2020-09-05 DIAGNOSIS — I10 Essential (primary) hypertension: Secondary | ICD-10-CM | POA: Diagnosis not present

## 2020-09-05 DIAGNOSIS — J309 Allergic rhinitis, unspecified: Secondary | ICD-10-CM

## 2020-09-05 DIAGNOSIS — E1165 Type 2 diabetes mellitus with hyperglycemia: Secondary | ICD-10-CM

## 2020-09-05 DIAGNOSIS — G4700 Insomnia, unspecified: Secondary | ICD-10-CM

## 2020-09-05 DIAGNOSIS — E1169 Type 2 diabetes mellitus with other specified complication: Secondary | ICD-10-CM

## 2020-09-05 DIAGNOSIS — E785 Hyperlipidemia, unspecified: Secondary | ICD-10-CM

## 2020-09-05 LAB — HEMOGLOBIN A1C: Hgb A1c MFr Bld: 6.8 % — ABNORMAL HIGH (ref 4.6–6.5)

## 2020-09-05 LAB — LIPID PANEL
Cholesterol: 217 mg/dL — ABNORMAL HIGH (ref 0–200)
HDL: 29.9 mg/dL — ABNORMAL LOW (ref >39.00)
LDL Cholesterol: 156 mg/dL — ABNORMAL HIGH (ref 0–99)
NonHDL: 186.73
Total CHOL/HDL Ratio: 7
Triglycerides: 154 mg/dL — ABNORMAL HIGH (ref 0.0–149.0)
VLDL: 30.8 mg/dL (ref 0.0–40.0)

## 2020-09-05 LAB — GLUCOSE, RANDOM: Glucose, Bld: 124 mg/dL — ABNORMAL HIGH (ref 70–99)

## 2020-09-05 MED ORDER — ZOLPIDEM TARTRATE 5 MG PO TABS: 5.0000 mg | ORAL_TABLET | Freq: Every evening | ORAL | 1 refills | Status: AC | PRN

## 2020-09-05 MED ORDER — METFORMIN HCL 1000 MG PO TABS: ORAL_TABLET | ORAL | 1 refills | Status: DC

## 2020-09-05 MED ORDER — ZOLPIDEM TARTRATE 5 MG PO TABS: 5.0000 mg | ORAL_TABLET | Freq: Every evening | ORAL | 1 refills | Status: DC | PRN

## 2020-09-05 MED ORDER — FLUTICASONE PROPIONATE 50 MCG/ACT NA SUSP: 1.0000 | Freq: Two times a day (BID) | NASAL | 6 refills | Status: AC

## 2020-09-05 NOTE — Progress Notes (Signed)
[facility] [address] [place] [address] Dept: [phone] Dept Fax: [phone]  Office Visit  Subjective:    Patient ID: Bryan English., male    DOB: 1966-01-01, 55 y.o..   MRN: [id number]  Chief Complaint  Patient presents with   Insomnia    Pt c/o not being able to sleep due to racing thoughts x1 wk, only gets 2-3 hrs of sleep.     History of Present Illness:  Patient is in today for complaining of a 1 week history of sleep disturbance. [name] notes that he has had racing thoughts at bedtime that seem to delay sleep onset. He also has felt a "tremor" sensation in his legs, while lying awake. When he can sleep, he only gets 2-3 hours, though last night he slept 6 hours. He finds the lack of sleep makes him feel dizzy during the day and feel "off". He had a similar issue for a short period of time some years ago, and responded to a short course of Ambien.  [name] also notes some recent issue with nasal congestion that will periodically open up and he will have some mild drainage/PND. He has had occasional sneezing. He denies any eye symptoms.  [name] a history of Type 2 DM. He has not had labs in some months. He does continue to take metformin for this. He has a history of hyperlipidemia. He was on atorvastatin, but says he stopped this because it was making him dizzy.  [name] a history of hypertension. He was seen by Dr. Percival Spanish (cardiology) in June. His blood pressure record from home was in good control. [name] had a previous nonischemic cardiomyopathy that has been gradually improving. He is on Entresto, metoprolol, and Jardiance. He has some hydralazine available for BP flares.  Past Medical History: Patient Active Problem List   Diagnosis Date Noted   History of colon polyps 05/08/2020   Hyperlipidemia associated with type 2 diabetes mellitus ([place]) 03/27/2020    Psoriasis 03/27/2020   Type 2 diabetes mellitus with hyperglycemia, without long-term current use of insulin ([place]) 06/12/2018   Class 2 severe obesity with serious comorbidity and body mass index (BMI) of 38.0 to 38.9 in adult [facility]) 06/12/2018   Nonischemic cardiomyopathy ([place]) 10/27/2017   Elevated coronary artery calcium score 10/14/2017   Essential hypertension 10/14/2017   Past Surgical History:  Procedure Laterality Date   HAND SURGERY     LEFT HEART CATH AND CORONARY ANGIOGRAPHY N/A 10/29/2017   Procedure: LEFT HEART CATH AND CORONARY ANGIOGRAPHY;  Surgeon: [name], MD;  Location: [facility];  Service: Cardiovascular;  Laterality: N/A;   Family History  Adopted: Yes  Problem Relation Age of Onset   Diabetes Mother    Colon cancer Neg Hx    Colon polyps Neg Hx    Esophageal cancer Neg Hx    Rectal cancer Neg Hx    Stomach cancer Neg Hx    Outpatient Medications Prior to Visit  Medication Sig Dispense Refill   Blood Glucose Monitoring Suppl (TRUE METRIX AIR GLUCOSE METER) w/Device KIT 1 kit by Does not apply route 2 (two) times daily. 1 kit 0   clobetasol cream (TEMOVATE) 8.18 % Apply 1 application topically 2 (two) times daily. 60 g prn   empagliflozin (JARDIANCE) 10 MG TABS tablet Take 1 tablet (10 mg total) by mouth daily before breakfast. 30 tablet 6   glucose blood (TRUE  METRIX BLOOD GLUCOSE TEST) test strip Use as instructed to check blood sugars twice daily and as needed 100 each 3   metoprolol (TOPROL-XL) 200 MG 24 hr tablet TAKE 1 TABLET (200 MG TOTAL) BY MOUTH DAILY TAKE WITH OR IMMEDIATELY FOLLOWING A MEAL. 30 tablet 9   pantoprazole (PROTONIX) 40 MG tablet Take 1 tablet (40 mg total) by mouth daily. 90 tablet 4   sacubitril-valsartan (ENTRESTO) 97-103 MG TAKE 1 TABLET BY MOUTH 2 (TWO) TIMES DAILY. 60 tablet 6   TRUEPLUS LANCETS 28G MISC Use twice daily to check blood sugars and as needed 100 each 3   metFORMIN (GLUCOPHAGE) 1000 MG tablet TAKE 1 TABLET  (1,000 MG TOTAL) BY MOUTH EVERY EVENING. (Patient taking differently: Take 1,000 mg by mouth every evening.) 90 tablet 1   atorvastatin (LIPITOR) 40 MG tablet Take 1 tablet (40 mg total) by mouth daily. (Patient not taking: Reported on 09/05/2020) 30 tablet 3   baclofen (LIORESAL) 10 MG tablet Take 1 tablet (10 mg total) by mouth 3 (three) times daily. (Patient not taking: Reported on 09/05/2020) 30 each 0   hydrALAZINE (APRESOLINE) 10 MG tablet Take 1 tablet (10 mg total) by mouth every 8 (eight) hours as needed (for blood pressure greater than 142 systolic.). (Patient not taking: Reported on 09/05/2020) 30 tablet 3   methylPREDNISolone (MEDROL DOSEPAK) 4 MG TBPK tablet Take as directed (Patient not taking: Reported on 09/05/2020) 1 each 0   No facility-administered medications prior to visit.   No Known Allergies    Objective:   Today's Vitals   09/05/20 1138  BP: (!) 168/94  Pulse: 61  Temp: (!) 96.8 F (36 C)  TempSrc: Temporal  SpO2: 97%  Weight: 251 lb 12.8 oz (114.2 kg)  Height: $Remove'5\' 5"'ZAjmKER$  (1.651 m)   Body mass index is 41.9 kg/m.   General: Well developed, well nourished. No acute distress. HEENT: Normocephalic, non-traumatic.  Nasal mucosa is red and swollen with mild clear   rhinorrhea. Mucous membranes moist. Oropharynx clear. Good dentition. Psych: Alert and oriented. Normal mood and affect.  Health Maintenance Due  Topic Date Due   PNEUMOCOCCAL POLYSACCHARIDE VACCINE AGE 35-64 HIGH RISK  Never done   Pneumococcal Vaccine 69-8 Years old (1 - PCV) Never done   FOOT EXAM  Never done   OPHTHALMOLOGY EXAM  Never done   Hepatitis C Screening  Never done   TETANUS/TDAP  Never done   Zoster Vaccines- Shingrix (1 of 2) Never done   COVID-19 Vaccine (3 - Pfizer risk series) 12/15/2019   INFLUENZA VACCINE  08/14/2020    Lab Results Lab Results  Component Value Date   CHOL 217 (H) 04/27/2020   HDL 26 (L) 04/27/2020   LDLCALC 167 (H) 04/27/2020   TRIG 118 04/27/2020   CHOLHDL  8.3 04/27/2020   Lab Results  Component Value Date   HGBA1C 6.6 (H) 03/27/2020     Assessment & Plan:   1. Insomnia, unspecified type [name] is having a recent issue with insomnia, likely related to work stressors. We did discuss sleep hygiene issues. I will prescribe a short-course of Ambien.  - zolpidem (AMBIEN) 5 MG tablet; Take 1 tablet (5 mg total) by mouth at bedtime as needed for sleep.  Dispense: 15 tablet; Refill: 1  2. Allergic rhinitis, unspecified seasonality, unspecified trigger Nasal exam is consistent with allergic rhinitis. I will have him use Flonase and monitor for improvement.  - fluticasone (FLONASE) 50 MCG/ACT nasal spray; Place 1 spray into both  nostrils 2 (two) times daily. Reduce to once a day, after 1 month.  Dispense: 16 g; Refill: 6  3. Type 2 diabetes mellitus with hyperglycemia, without long-term current use of insulin [facility]) [name] is due for follow-up of his diabetes. We will check quarterly labs. I will renew his metformin. He will also continue on Jardiance.  - metFORMIN (GLUCOPHAGE) 1000 MG tablet; TAKE 1 TABLET (1,000 MG TOTAL) BY MOUTH EVERY EVENING.  Dispense: 90 tablet; Refill: 1 - Glucose, random - Hemoglobin A1c  4. Essential hypertension Blood pressure is high. After review of recent cardiology notes, it is apparent that he has a white-coat hypertension component. He will continue on Entresto.  5. Hyperlipidemia associated with type 2 diabetes mellitus ([place]) Due for lipid reassessment. [name] stopped his atorvastatin. If lipids are elevated, will discuss trying an alternative statin.  - Lipid panel  Haydee Salter, MD

## 2020-09-05 NOTE — Addendum Note (Signed)
Addended by: Haydee Salter on: 09/05/2020 02:14 PM   Modules accepted: Orders

## 2020-09-20 ENCOUNTER — Other Ambulatory Visit: Payer: Self-pay

## 2020-09-20 ENCOUNTER — Emergency Department (HOSPITAL_BASED_OUTPATIENT_CLINIC_OR_DEPARTMENT_OTHER): Payer: 59

## 2020-09-20 ENCOUNTER — Encounter (HOSPITAL_BASED_OUTPATIENT_CLINIC_OR_DEPARTMENT_OTHER): Payer: Self-pay | Admitting: Emergency Medicine

## 2020-09-20 ENCOUNTER — Observation Stay (HOSPITAL_BASED_OUTPATIENT_CLINIC_OR_DEPARTMENT_OTHER)
Admission: EM | Admit: 2020-09-20 | Discharge: 2020-09-21 | Disposition: A | Discharge status: Home / Self Care | Payer: 59 | Attending: Cardiology | Admitting: Cardiology

## 2020-09-20 DIAGNOSIS — I472 Ventricular tachycardia: Secondary | ICD-10-CM | POA: Diagnosis not present

## 2020-09-20 DIAGNOSIS — E876 Hypokalemia: Secondary | ICD-10-CM | POA: Insufficient documentation

## 2020-09-20 DIAGNOSIS — R778 Other specified abnormalities of plasma proteins: Secondary | ICD-10-CM | POA: Insufficient documentation

## 2020-09-20 DIAGNOSIS — Z20822 Contact with and (suspected) exposure to covid-19: Secondary | ICD-10-CM | POA: Diagnosis not present

## 2020-09-20 DIAGNOSIS — I428 Other cardiomyopathies: Secondary | ICD-10-CM | POA: Diagnosis not present

## 2020-09-20 DIAGNOSIS — I2510 Atherosclerotic heart disease of native coronary artery without angina pectoris: Secondary | ICD-10-CM | POA: Insufficient documentation

## 2020-09-20 DIAGNOSIS — R000 Tachycardia, unspecified: Secondary | ICD-10-CM | POA: Diagnosis not present

## 2020-09-20 DIAGNOSIS — E119 Type 2 diabetes mellitus without complications: Secondary | ICD-10-CM | POA: Insufficient documentation

## 2020-09-20 DIAGNOSIS — I10 Essential (primary) hypertension: Secondary | ICD-10-CM | POA: Diagnosis not present

## 2020-09-20 DIAGNOSIS — Z7984 Long term (current) use of oral hypoglycemic drugs: Secondary | ICD-10-CM | POA: Diagnosis not present

## 2020-09-20 DIAGNOSIS — Z79899 Other long term (current) drug therapy: Secondary | ICD-10-CM | POA: Diagnosis not present

## 2020-09-20 DIAGNOSIS — I4581 Long QT syndrome: Secondary | ICD-10-CM | POA: Insufficient documentation

## 2020-09-20 DIAGNOSIS — R002 Palpitations: Secondary | ICD-10-CM | POA: Diagnosis present

## 2020-09-20 DIAGNOSIS — I4729 Other ventricular tachycardia: Secondary | ICD-10-CM

## 2020-09-20 LAB — CBC WITH DIFFERENTIAL/PLATELET
Abs Immature Granulocytes: 0.06 10*3/uL (ref 0.00–0.07)
Basophils Absolute: 0.1 10*3/uL (ref 0.0–0.1)
Basophils Relative: 1 %
Eosinophils Absolute: 0.7 10*3/uL — ABNORMAL HIGH (ref 0.0–0.5)
Eosinophils Relative: 5 %
HCT: 44.2 % (ref 39.0–52.0)
Hemoglobin: 14.5 g/dL (ref 13.0–17.0)
Immature Granulocytes: 0 %
Lymphocytes Relative: 40 %
Lymphs Abs: 5.7 10*3/uL — ABNORMAL HIGH (ref 0.7–4.0)
MCH: 25.9 pg — ABNORMAL LOW (ref 26.0–34.0)
MCHC: 32.8 g/dL (ref 30.0–36.0)
MCV: 79.1 fL — ABNORMAL LOW (ref 80.0–100.0)
Monocytes Absolute: 1 10*3/uL (ref 0.1–1.0)
Monocytes Relative: 7 %
Neutro Abs: 6.7 10*3/uL (ref 1.7–7.7)
Neutrophils Relative %: 47 %
Platelets: 323 10*3/uL (ref 150–400)
RBC: 5.59 MIL/uL (ref 4.22–5.81)
RDW: 14.1 % (ref 11.5–15.5)
WBC: 14.2 10*3/uL — ABNORMAL HIGH (ref 4.0–10.5)
nRBC: 0 % (ref 0.0–0.2)

## 2020-09-20 LAB — CREATININE, SERUM
Creatinine, Ser: 0.91 mg/dL (ref 0.61–1.24)
GFR, Estimated: >60 mL/min (ref >60)

## 2020-09-20 LAB — COMPREHENSIVE METABOLIC PANEL
ALT: 21 U/L (ref 0–44)
AST: 22 U/L (ref 15–41)
Albumin: 4 g/dL (ref 3.5–5.0)
Alkaline Phosphatase: 67 U/L (ref 38–126)
Anion gap: 10 (ref 5–15)
BUN: 17 mg/dL (ref 6–20)
CO2: 22 mmol/L (ref 22–32)
Calcium: 8.7 mg/dL — ABNORMAL LOW (ref 8.9–10.3)
Chloride: 104 mmol/L (ref 98–111)
Creatinine, Ser: 1.03 mg/dL (ref 0.61–1.24)
GFR, Estimated: >60 mL/min (ref >60)
Glucose, Bld: 161 mg/dL — ABNORMAL HIGH (ref 70–99)
Potassium: 2.8 mmol/L — ABNORMAL LOW (ref 3.5–5.1)
Sodium: 136 mmol/L (ref 135–145)
Total Bilirubin: 0.4 mg/dL (ref 0.3–1.2)
Total Protein: 7.8 g/dL (ref 6.5–8.1)

## 2020-09-20 LAB — PROTIME-INR
INR: 1 (ref 0.8–1.2)
Prothrombin Time: 12.8 seconds (ref 11.4–15.2)

## 2020-09-20 LAB — APTT: aPTT: 28 seconds (ref 24–36)

## 2020-09-20 LAB — CBC
HCT: 45.2 % (ref 39.0–52.0)
Hemoglobin: 14.5 g/dL (ref 13.0–17.0)
MCH: 25.5 pg — ABNORMAL LOW (ref 26.0–34.0)
MCHC: 32.1 g/dL (ref 30.0–36.0)
MCV: 79.4 fL — ABNORMAL LOW (ref 80.0–100.0)
Platelets: 305 10*3/uL (ref 150–400)
RBC: 5.69 MIL/uL (ref 4.22–5.81)
RDW: 14.1 % (ref 11.5–15.5)
WBC: 11.1 10*3/uL — ABNORMAL HIGH (ref 4.0–10.5)
nRBC: 0 % (ref 0.0–0.2)

## 2020-09-20 LAB — URINALYSIS, COMPLETE (UACMP) WITH MICROSCOPIC
Bacteria, UA: NONE SEEN
Bilirubin Urine: NEGATIVE
Glucose, UA: NEGATIVE mg/dL
Ketones, ur: NEGATIVE mg/dL
Leukocytes,Ua: NEGATIVE
Nitrite: NEGATIVE
Protein, ur: NEGATIVE mg/dL
Specific Gravity, Urine: 1.008 (ref 1.005–1.030)
pH: 6 (ref 5.0–8.0)

## 2020-09-20 LAB — RESP PANEL BY RT-PCR (FLU A&B, COVID) ARPGX2
Influenza A by PCR: NEGATIVE
Influenza B by PCR: NEGATIVE
SARS Coronavirus 2 by RT PCR: NEGATIVE

## 2020-09-20 LAB — GLUCOSE, CAPILLARY
Glucose-Capillary: 156 mg/dL — ABNORMAL HIGH (ref 70–99)
Glucose-Capillary: 151 mg/dL — ABNORMAL HIGH (ref 70–99)
Glucose-Capillary: 132 mg/dL — ABNORMAL HIGH (ref 70–99)

## 2020-09-20 LAB — MAGNESIUM: Magnesium: 1.6 mg/dL — ABNORMAL LOW (ref 1.7–2.4)

## 2020-09-20 LAB — CBG MONITORING, ED: Glucose-Capillary: 156 mg/dL — ABNORMAL HIGH (ref 70–99)

## 2020-09-20 LAB — TROPONIN I (HIGH SENSITIVITY)
Troponin I (High Sensitivity): 5 ng/L (ref <18)
Troponin I (High Sensitivity): 61 ng/L — ABNORMAL HIGH (ref <18)
Troponin I (High Sensitivity): 73 ng/L — ABNORMAL HIGH (ref <18)
Troponin I (High Sensitivity): 67 ng/L — ABNORMAL HIGH (ref <18)

## 2020-09-20 LAB — POTASSIUM: Potassium: 4.7 mmol/L (ref 3.5–5.1)

## 2020-09-20 MED ORDER — ACETAMINOPHEN 325 MG PO TABS: 650.0000 mg | ORAL_TABLET | ORAL | Status: DC | PRN

## 2020-09-20 MED ORDER — ONDANSETRON HCL 4 MG/2ML IJ SOLN: 4.0000 mg | Freq: Four times a day (QID) | INTRAMUSCULAR | Status: DC | PRN

## 2020-09-20 MED ORDER — ETOMIDATE 2 MG/ML IV SOLN
INTRAVENOUS | Status: AC
  Filled 2020-09-20: qty 10

## 2020-09-20 MED ORDER — HEPARIN SODIUM (PORCINE) 5000 UNIT/ML IJ SOLN
5000.0000 [IU] | Freq: Three times a day (TID) | INTRAMUSCULAR | Status: DC
  Administered 2020-09-20 – 2020-09-21 (×3): 5000 [IU] via SUBCUTANEOUS
  Filled 2020-09-20 (×3): qty 1

## 2020-09-20 MED ORDER — MAGNESIUM SULFATE 50 % IJ SOLN
1.0000 g | Freq: Once | INTRAMUSCULAR | Status: AC
  Administered 2020-09-20: 1 g via INTRAVENOUS
  Filled 2020-09-20: qty 2

## 2020-09-20 MED ORDER — ETOMIDATE 2 MG/ML IV SOLN: 10.0000 mg | Freq: Once | INTRAVENOUS | Status: DC

## 2020-09-20 MED ORDER — SACUBITRIL-VALSARTAN 97-103 MG PO TABS
1.0000 | ORAL_TABLET | Freq: Two times a day (BID) | ORAL | Status: DC
  Administered 2020-09-20 – 2020-09-21 (×2): 1 via ORAL
  Filled 2020-09-20 (×3): qty 1

## 2020-09-20 MED ORDER — FENTANYL CITRATE PF 50 MCG/ML IJ SOSY: 50.0000 ug | PREFILLED_SYRINGE | Freq: Once | INTRAMUSCULAR | Status: DC

## 2020-09-20 MED ORDER — ROSUVASTATIN CALCIUM 20 MG PO TABS
20.0000 mg | ORAL_TABLET | Freq: Every day | ORAL | Status: DC
  Administered 2020-09-20 – 2020-09-21 (×2): 20 mg via ORAL
  Filled 2020-09-20 (×2): qty 1

## 2020-09-20 MED ORDER — INSULIN ASPART 100 UNIT/ML IJ SOLN
0.0000 [IU] | Freq: Three times a day (TID) | INTRAMUSCULAR | Status: DC
  Administered 2020-09-20: 2 [IU] via SUBCUTANEOUS
  Administered 2020-09-21 (×2): 1 [IU] via SUBCUTANEOUS

## 2020-09-20 MED ORDER — POTASSIUM CHLORIDE 20 MEQ PO PACK
40.0000 meq | PACK | Freq: Two times a day (BID) | ORAL | Status: DC
  Administered 2020-09-20 – 2020-09-21 (×3): 40 meq via ORAL
  Filled 2020-09-20 (×3): qty 2

## 2020-09-20 MED ORDER — SODIUM CHLORIDE 0.9 % IV BOLUS
500.0000 mL | Freq: Once | INTRAVENOUS | Status: AC
  Administered 2020-09-20: 500 mL via INTRAVENOUS

## 2020-09-20 MED ORDER — POTASSIUM CHLORIDE 10 MEQ/100ML IV SOLN
10.0000 meq | INTRAVENOUS | Status: AC
Start: 2020-09-20 — End: 2020-09-20
  Administered 2020-09-20 (×3): 10 meq via INTRAVENOUS
  Filled 2020-09-20 (×3): qty 100

## 2020-09-20 MED ORDER — PANTOPRAZOLE SODIUM 40 MG PO TBEC
40.0000 mg | DELAYED_RELEASE_TABLET | Freq: Every day | ORAL | Status: DC
  Administered 2020-09-21: 40 mg via ORAL
  Filled 2020-09-20 (×2): qty 1

## 2020-09-20 MED ORDER — METOPROLOL SUCCINATE ER 100 MG PO TB24
200.0000 mg | ORAL_TABLET | Freq: Every day | ORAL | Status: DC
  Administered 2020-09-21: 200 mg via ORAL
  Filled 2020-09-20: qty 2

## 2020-09-20 MED ORDER — FENTANYL CITRATE PF 50 MCG/ML IJ SOSY
PREFILLED_SYRINGE | INTRAMUSCULAR | Status: AC
  Filled 2020-09-20: qty 1

## 2020-09-20 MED ORDER — EMPAGLIFLOZIN 10 MG PO TABS: 10.0000 mg | ORAL_TABLET | Freq: Every day | ORAL | Status: DC

## 2020-09-20 NOTE — ED Triage Notes (Signed)
Pt states he went to bed last night feeling light headed  Pt woke up this morning feeling a little better but still light headed  Pt states he checked his blood pressure and it was 98/80 and his heart rate was 208

## 2020-09-20 NOTE — ED Notes (Signed)
PHONE HANDOFF REPORT PROVIDED TO [place]

## 2020-09-20 NOTE — ED Notes (Signed)
zoll pads placed onto patient

## 2020-09-20 NOTE — ED Notes (Signed)
Upon arrival to room placed pt on monitor  Irregular rapid heart beat  EDP to bedside

## 2020-09-20 NOTE — H&P (Addendum)
Cardiology Admission History and Physical:   Patient ID: Bryan English. MRN: [id number]; DOB: Dec 05, 1965   Admission date: 09/20/2020  PCP:  Haydee Salter, MD   [facility] Cardiologist:  [name], MD   {    Chief Complaint:  rapid heart rate   Patient Profile:   Jasaun Carn. is a 55 y.o. male with PMH of nonobstructive CAD from cath 2019, history of nonischemic cardiomyopathy with recovered EF, HTN, HLD, type 2 DM, GERD, obesity, who is being seen 09/20/2020 for the evaluation of A fib RVR.  History of Present Illness:   [name] follows Dr Percival Spanish outpatient since 2019.  He was seen initially seen for chest pain. Echo from 10/22/2017 showed EF 20-25%, diffuse hypokinesis, grade II DD, LA moderate to severely dilated, mild MR and trivial TR. He underwent cardiac catheterization on 10/29/2017 which showed no significant obstructive CAD, with a smooth 25-30% prox RCA narrowing and normal left coronary circulation. LVEDP was 17 mmHg. Etiology for CM was non-ischemic.  He was placed on GDMT with Metoprolol XL $RemoveBefor'100mg'[id number]$  and lisinopril $RemoveBefore'10mg'CVcfmOBjtkHZm$ . Echo repeated on 01/25/2019 showed improved EF to 50-55%, average left ventricular global longitudinal strain is -15.9 %, grade I DD, LV global hypokinesis, and no valvular disease. He was later switched to Entresto 97/103 BID and increased metorpolol XL to $Rem'200mg'qvvB$  daily for GDMT.   Inpatient cardiology consult was done on 04/27/2020 while he was admitted for chest pain, trop was flat and not consistent with ACS, no further ischemic work up was recommended. Echo from March 2022 showed stable LV function and grade I DD. Patient was continued on home dose GDMT. He was noted to have some frequent PVCs and no significant arrhythmia noted on telemetry. Event monitor was recommended to evaluate PVC burden.   He was last seen in the office on 07/04/20, BP was mildly elevated at the visit. Event monitor was reviewed, noted an  episode of NSVT, bradycardia episode was noted but not sustained. No change of meds was recommended.   Patient presented to ER at [facility] for complaints of irregular heartbeat.  He endorses feeling lightheaded since last night, symptom is slightly improved this morning.  He has a pulse oximeter that he uses to monitor his heart rate and oxygen, noted his heart rate was 100s this morning, which is faster than his baseline 70s.  He had experienced heart palpitation with irregular heartbeat sensation.  In route to ER, he noted his heart rate was up to 200s.  He is in his normal state of health until today.  Recently changed his diet to low carbohydrate and high vegetable/fruit.  He noted he is having more loose stool average twice daily than usual.  He does not take any diuretics at baseline.  He reports having normal oral intake over the past week, did have dehydration 1 month ago where he visited his PCP.  He is heart palpitation has resolved, denied any chest pain/pressure, syncope, current dizziness, weight gain or loss, orthopnea.  Admission diagnostic revealed severe hypokalemia 2.8, hypomagnesemia 1.6, CMP otherwise unremarkable.  High sensitive troponin 5 >61.  CBC differential revealed leukocytosis with WBC 14 200, mild lymphocytosis.  INR 1.  Flu and COVID swab negative.  Chest x-ray revealed no acute finding.  Initial EKG showed narrowed complex tachycardia with concern of polymorphic ventricular tachycardia versus artifact. Repeat EKG showed sinus tachycardia 133 bpm with prolonged QT.   He was tachycardiac with HR up to  170s at ED, hypertensive with BP up to 170/141,  required 2-4L [place] oxygen support. He was given 2g Mag IV and potassium 40 meq at ED. Subsequently he is trasnferred from [facility] ER to [facility] for further evaluation by cardiology.      Past Medical History:  Diagnosis Date   Diabetes mellitus without complication (HCC)    GERD (gastroesophageal reflux disease)     Hyperlipidemia    Hypertension    NICM (nonischemic cardiomyopathy) ([place]) 10/29/2017   EF 20% by echo, minimal CAD at cath    Past Surgical History:  Procedure Laterality Date   HAND SURGERY     LEFT HEART CATH AND CORONARY ANGIOGRAPHY N/A 10/29/2017   Procedure: LEFT HEART CATH AND CORONARY ANGIOGRAPHY;  Surgeon: [name], MD;  Location: [facility];  Service: Cardiovascular;  Laterality: N/A;     Medications Prior to Admission: Prior to Admission medications   Medication Sig Start Date End Date Taking? Authorizing Provider  Blood Glucose Monitoring Suppl (TRUE METRIX AIR GLUCOSE METER) w/Device KIT 1 kit by Does not apply route 2 (two) times daily. 12/02/17   [name], [name], MD  clobetasol cream (TEMOVATE) 2.02 % Apply 1 application topically 2 (two) times daily. 03/27/20   Haydee Salter, MD  empagliflozin (JARDIANCE) 10 MG TABS tablet Take 1 tablet (10 mg total) by mouth daily before breakfast. 04/21/20   [name], MD  fluticasone (FLONASE) 50 MCG/ACT nasal spray Place 1 spray into both nostrils 2 (two) times daily. Reduce to once a day, after 1 month. 09/05/20 10/05/20  Haydee Salter, MD  glucose blood (TRUE METRIX BLOOD GLUCOSE TEST) test strip Use as instructed to check blood sugars twice daily and as needed 05/16/20   Haydee Salter, MD  metFORMIN (GLUCOPHAGE) 1000 MG tablet TAKE 1 TABLET (1,000 MG TOTAL) BY MOUTH EVERY EVENING. 09/05/20 09/05/21  Haydee Salter, MD  metoprolol (TOPROL-XL) 200 MG 24 hr tablet TAKE 1 TABLET (200 MG TOTAL) BY MOUTH DAILY TAKE WITH OR IMMEDIATELY FOLLOWING A MEAL. 05/03/20 05/03/21  [name], MD  pantoprazole (PROTONIX) 40 MG tablet Take 1 tablet (40 mg total) by mouth daily. 03/27/20   Haydee Salter, MD  sacubitril-valsartan (ENTRESTO) 97-103 MG TAKE 1 TABLET BY MOUTH 2 (TWO) TIMES DAILY. 04/03/20 04/03/21  [name], MD  TRUEPLUS LANCETS 28G MISC Use twice daily to check blood sugars and as needed 12/02/17   [name], [name],  MD  zolpidem (AMBIEN) 5 MG tablet Take 1 tablet (5 mg total) by mouth at bedtime as needed for sleep. 09/05/20   Haydee Salter, MD     Allergies:   No Known Allergies  Social History:   Social History   Socioeconomic History   Marital status: Married    Spouse name: Not on file   Number of children: Not on file   Years of education: Not on file   Highest education level: Not on file  Occupational History   Not on file  Tobacco Use   Smoking status: Never   Smokeless tobacco: Never  Vaping Use   Vaping Use: Never used  Substance and Sexual Activity   Alcohol use: Not Currently   Drug use: Never   Sexual activity: Not on file  Other Topics Concern   Not on file  Social History Narrative   Married.  Lives with wife.  Two children natural.      Social Determinants of Health   Financial Resource Strain: Not  on file  Food Insecurity: Not on file  Transportation Needs: Not on file  Physical Activity: Not on file  Stress: Not on file  Social Connections: Not on file  Intimate Partner Violence: Not on file    Family History:   The patient's family history includes Diabetes in his mother. There is no history of Colon cancer, Colon polyps, Esophageal cancer, Rectal cancer, or Stomach cancer. He was adopted.    ROS:  Constitutional: Denied fever, chills, malaise, night sweats Eyes: Denied vision change or loss Ears/Nose/Mouth/Throat: Denied ear ache, sore throat, coughing, sinus pain Cardiovascular: see HPI  Respiratory: denied shortness of breath Gastrointestinal: Denied nausea, vomiting, abdominal pain, diarrhea Genital/Urinary: Denied dysuria, hematuria, urinary frequency/urgency Musculoskeletal: Denied muscle ache, joint pain, weakness Skin: Denied rash, wound Neuro: Denied headache, dizziness, syncope Psych: Denied history of depression/anxiety  Endocrine: history of diabetes   Physical Exam/Data:   Vitals:   09/20/20 0830 09/20/20 1008 09/20/20 1126 09/20/20  1309  BP: (!) 156/137 (!) 133/97 127/88   Pulse: (!) 113 (!) 102 93   Resp: (!) 33 20 20   Temp:   98.6 F (37 C)   TempSrc:   Oral   SpO2: 97% 98% 99% 99%  Weight:   117.3 kg   Height:   5' 8.75" (1.746 m)     Intake/Output Summary (Last 24 hours) at 09/20/2020 1407 Last data filed at 09/20/2020 1012 Gross per 24 hour  Intake 96.66 ml  Output 1480 ml  Net -1383.34 ml   Last 3 Weights 09/20/2020 09/20/2020 09/05/2020  Weight (lbs) 258 lb 9.6 oz 253 lb 251 lb 12.8 oz  Weight (kg) 117.3 kg 114.76 kg 114.216 kg     Body mass index is 38.47 kg/m.   Vitals:  Vitals:   09/20/20 1126 09/20/20 1309  BP: 127/88   Pulse: 93   Resp: 20   Temp: 98.6 F (37 C)   SpO2: 99% 99%   General Appearance: In no apparent distress, laying in bed, obese HEENT: Normocephalic, atraumatic. EOMs intact.  Oral cavity moist Neck: Supple, trachea midline, no JVDs Cardiovascular: Regular rate and rhythm, normal S1-S2,  no murmur/rub/gallop Respiratory: Resting breathing unlabored, lungs sounds clear to auscultation bilaterally, no use of accessory muscles. On room air.  No wheezes, rales or rhonchi.   Gastrointestinal: Bowel sounds positive, abdomen soft, non-tender, non-distended.  Extremities: Able to move all extremities in bed without difficulty, no edema/cyanosis/clubbing Genitourinary: genital exam not performed Musculoskeletal: Normal muscle bulk and tone, muscle strength 5/5 throughout, no limited range of motion, no swollen or erythematous joints Skin: Intact, warm, dry. No rashes or petechiae noted in exposed areas.  Neurologic: Alert, oriented to person, place and time. Fluent speech,  no cognitive deficit, no gross focal neuro deficit Psychiatric: Normal affect. Mood is appropriate.    EKG:    Initial EKG at 09/20/20 7:02 AM difficult to interpret, concerning for polymorphic ventricular tachycardia, probable sinus tachycardia   Repeat EKG at 7:14 AM on 09/20/20 with sinus tachycardia,  ventricular rate 133, low voltage, prolonged QT, manual Qtc 596 msec    Relevant CV Studies:  Event monitor 06/15/20:  Patch Wear Time: 13 days and 22 hours (2022-04-21T13:28:18-0400 to 2022-05-05T11:44:43-0400) Patient had a min HR of 51 bpm, max HR of 169 bpm, and avg HR of 72 bpm. Predominant underlying rhythm was Sinus Rhythm. 1 run of Ventricular Tachycardia occurred lasting 7 beats with a max rate of 169 bpm (avg 151 bpm). Idioventricular Rhythm was present. Isolated SVEs  were rare (<1.0%), SVE Couplets were rare (<1.0%), and SVE Triplets were rare (<1.0%). Isolated VEs were occasional (1.5%, 21676), VE Couplets were rare (<1.0%, 184), and VE Triplets were rare (<1.0%, 15). Ventricular Bigeminy and Trigeminy were present.   Normal sinus rhythm One run of non sustained ventricular tachycardia lasting 7 beats. Rare premature ventricular contractions Rare premature atrial contractions.  No sustained arrhythmias   Echo on 03/15/2020:   1. Left ventricular ejection fraction, by estimation, is 50 to 55%. Left  ventricular ejection fraction by 3D volume is 55 %. The left ventricle has  low normal function. The left ventricle has no regional wall motion  abnormalities. There is mild left  ventricular hypertrophy. Left ventricular diastolic parameters are  consistent with Grade I diastolic dysfunction (impaired relaxation). The  average left ventricular global longitudinal strain is -20.0 %.   2. Right ventricular systolic function is normal. The right ventricular  size is normal. Tricuspid regurgitation signal is inadequate for assessing  PA pressure.   3. The mitral valve is normal in structure. No evidence of mitral valve  regurgitation.   4. The aortic valve is tricuspid. Aortic valve regurgitation is not  visualized. No aortic stenosis is present.   5. Aortic dilatation noted. There is mild dilatation of the aortic root,  measuring 43 mm. There is mild dilatation of the ascending  aorta,  measuring 38 mm.   6. The inferior vena cava is normal in size with greater than 50%  respiratory variability, suggesting right atrial pressure of 3 mmHg.   Comparison(s): 01/25/19 EF 50-55%. GLS -15.9%.   LHC on 10/29/2017:   No significant coronary obstructive disease with smooth 25 -30% proximal RCA narrowing and a normal left coronary circulation.   LVEDP 17 mmHg.  Previous echo Doppler documented EF of 20 to 25%.   RECOMMENDATION: Guideline directed medical therapy for  nonischemic cardiomyopathy.  The patient will follow-up with Dr. Antoine Poche for further evaluation and treatment. Recommend Aspirin 81mg  daily for moderate CAD.  Laboratory Data:  High Sensitivity Troponin:   Recent Labs  Lab 09/20/20 0706 09/20/20 1126  TROPONINIHS 5 61*      Chemistry Recent Labs  Lab 09/20/20 0706 09/20/20 1126  NA 136  --   K 2.8*  --   CL 104  --   CO2 22  --   GLUCOSE 161*  --   BUN 17  --   CREATININE 1.03 0.91  CALCIUM 8.7*  --   GFRNONAA >60 >60  ANIONGAP 10  --     Recent Labs  Lab 09/20/20 0706  PROT 7.8  ALBUMIN 4.0  AST 22  ALT 21  ALKPHOS 67  BILITOT 0.4   Hematology Recent Labs  Lab 09/20/20 0706 09/20/20 1126  WBC 14.2* 11.1*  RBC 5.59 5.69  HGB 14.5 14.5  HCT 44.2 45.2  MCV 79.1* 79.4*  MCH 25.9* 25.5*  MCHC 32.8 32.1  RDW 14.1 14.1  PLT 323 305   BNPNo results for input(s): BNP, PROBNP in the last 168 hours.  DDimer No results for input(s): DDIMER in the last 168 hours.   Radiology/Studies:  DG Chest Portable 1 View  Result Date: 09/20/2020 CLINICAL DATA:  tachycardia EXAM: PORTABLE CHEST 1 VIEW COMPARISON:  April 26, 2020. FINDINGS: The heart size and mediastinal contours are within normal limits. Both lungs are clear. No visible pleural effusions or pneumothorax. No acute osseous abnormality. IMPRESSION: No active disease. Electronically Signed   By: April 28, 2020.D.  On: 09/20/2020 07:35     Assessment and Plan:   Sinus  tachycardia Polymorphic ventricular tachycardia QT prolongation Hx of NSVT -Presented to ER with irregular heartbeat and heart palpitation and lightheadedness -Initial EKG concerning for polymorphic VT, repeat EKG revealed sinus tachycardia 130s with prolonged QT, manual QTC 596 msec -Telemetry here revealed sinus rhythm at 70s, occasional PVCs currently  -suspect severe hypokalemia induced cardiac arrhythmia - s/p potassium and magnesium repletion, will repeat potassium whole blood now -Continue telemetry monitor, trend electrolytes/replete as needed, avoid QT prolonging agents  Elevated troponin Nonobstructive CAD - Hs 5>61 - denied any chest pain or SOB, see symptom as above  - cath without obstructive CAD from 10/29/2017  - will trend troponin to peak , if further rising, will need ischemic evaluation  Severe hypokalemia Hypomagnesemia -Patient endorses recent diet change to high vegetables/fruits and low-carb, subsequent increased loose stool twice daily, not taking any diuretic, no clear cause of hypokalemia at this time - K 2.8 Mag 1.6 POA, s/p replacement, will repeat levels  Leukocytosis -Afebrile, loose stool twice daily with recent diet change, no other signs or complaints suggest infection, repeat WBC near normalized, monitor for fever  - will check urinalysis, CXR clear, COVID-19 and flu negative   History of nonischemic cardiomyopathy with recovered EF -No clinical signs of CHF decompensation -Echo from 03/2020 with EF 50 to 55%, no RMWA, no significant valvular disease, mild dilatation of aortic root 43 mm and ascending aorta measuring 38 mm -Continue GDMT with Entresto and metoprolol and Jardiance -will hold off repeating Echo after discussed with MD   Hyperlipidemia -LDL 156, total cholesterol 217, HDL 29, triglycerides 154 from lab on 09/05/2020 -Will initiate Crestor $RemoveBeforeDE'20mg'[id number]$  daily , not on meds at home   HTN -Blood pressure was initially elevated in the ED, will  resume metoprolol and Entresto, trend BP  Type 2 diabetes -Hemoglobin A1c 6.8% from 09/05/2020, fair control - will hold metformin, resume Jardiance -Will initiate Humalog insulin SSI AC for hyperglycemia if needed  - [facility] diet  GERD -Resumed home medicine PPI  DVT prophylaxis -Initiate subcutaneous heparin 5000 units q8h    Risk Assessment/Risk Scores:     Severity of Illness: The appropriate patient status for this patient is OBSERVATION. Observation status is judged to be reasonable and necessary in order to provide the required intensity of service to ensure the patient's safety. The patient's presenting symptoms, physical exam findings, and initial radiographic and laboratory data in the context of their medical condition is felt to place them at decreased risk for further clinical deterioration. Furthermore, it is anticipated that the patient will be medically stable for discharge from the hospital within 2 midnights of admission. The following factors support the patient status of observation.   " The patient's presenting symptoms include irregular HR. " The physical exam findings include N/A. " The initial radiographic and laboratory data are polymorphic VT on EKG.   For questions or updates, please contact [facility] Please consult [email or web address] for contact info under     Signed, Margie Billet, NP  09/20/2020 2:07 PM

## 2020-09-20 NOTE — ED Notes (Signed)
PHONE HANDOFF REPORT PROVIDED TO RYAN-RN AT [facility]

## 2020-09-20 NOTE — ED Provider Notes (Signed)
[facility] EMERGENCY DEPARTMENT Provider Note   CSN: [id number] Arrival date & time: 09/20/20  7564     History Chief Complaint  Patient presents with   Irregular Heart Beat    Bryan English. is a 55 y.o. male.  HPI Patient presents for irregular heartbeat.  He denies any history of the same.  He was diagnosed with nonischemic cardiomyopathy and had a decreased EF in the past.  He does see a cardiologist.  His last cardiology appointment was in June.  He did undergo a repeat echocardiogram in March which showed resolution of his decreased EF.  Patient does take metoprolol in the mornings.  He did take his dose this morning.  He reports that he woke up approximately 1 hour prior to arrival.  He woke up feeling normal but shortly thereafter (not 10 minutes), he felt palpitations and lightheadedness.  He has a watch that tracks his heartbeat and it got as high as 208 bpm.  For this reason, patient presents to the ED.  Currently, patient endorses continued lightheadedness.  He denies any chest discomfort or shortness of breath at this time.  He continues to experience palpitations.  He denies any preceding illnesses, diarrhea, decreased p.o. intake, drug use, or alcohol use.  He denies any recent changes in his medication.    Past Medical History:  Diagnosis Date   Diabetes mellitus without complication (HCC)    GERD (gastroesophageal reflux disease)    Hyperlipidemia    Hypertension    NICM (nonischemic cardiomyopathy) ([place]) 10/29/2017   EF 20% by echo, minimal CAD at cath    Patient Active Problem List   Diagnosis Date Noted   Tachycardia 09/20/2020   Tachyarrhythmia 09/20/2020   Ventricular tachycardia, polymorphic ([place])    Hypokalemia    Hypomagnesemia    History of colon polyps 05/08/2020   Hyperlipidemia associated with type 2 diabetes mellitus ([place]) 03/27/2020   Psoriasis 03/27/2020   Type 2 diabetes mellitus with hyperglycemia, without long-term  current use of insulin ([place]) 06/12/2018   Class 2 severe obesity with serious comorbidity and body mass index (BMI) of 38.0 to 38.9 in adult [facility]) 06/12/2018   Nonischemic cardiomyopathy ([place]) 10/27/2017   Elevated coronary artery calcium score 10/14/2017   Essential hypertension 10/14/2017    Past Surgical History:  Procedure Laterality Date   HAND SURGERY     LEFT HEART CATH AND CORONARY ANGIOGRAPHY N/A 10/29/2017   Procedure: LEFT HEART CATH AND CORONARY ANGIOGRAPHY;  Surgeon: [name], MD;  Location: [facility];  Service: Cardiovascular;  Laterality: N/A;       Family History  Adopted: Yes  Problem Relation Age of Onset   Diabetes Mother    Colon cancer Neg Hx    Colon polyps Neg Hx    Esophageal cancer Neg Hx    Rectal cancer Neg Hx    Stomach cancer Neg Hx     Social History   Tobacco Use   Smoking status: Never   Smokeless tobacco: Never  Vaping Use   Vaping Use: Never used  Substance Use Topics   Alcohol use: Not Currently   Drug use: Never    Home Medications Prior to Admission medications   Medication Sig Start Date End Date Taking? Authorizing Provider  clobetasol cream (TEMOVATE) 3.32 % Apply 1 application topically 2 (two) times daily. Patient taking differently: Apply 1 application topically 2 (two) times daily as needed (for swelling, itching, or redness of affected areas). 03/27/20  Yes Haydee Salter, MD  fluticasone [facility]) 50 MCG/ACT nasal spray Place 1 spray into both nostrils 2 (two) times daily. Reduce to once a day, after 1 month. Patient taking differently: Place 1 spray into both nostrils 2 (two) times daily as needed for allergies or rhinitis. 09/05/20 10/05/20 Yes Haydee Salter, MD  metFORMIN (GLUCOPHAGE) 1000 MG tablet TAKE 1 TABLET (1,000 MG TOTAL) BY MOUTH EVERY EVENING. Patient taking differently: Take 1,000 mg by mouth at bedtime. 09/05/20 09/05/21 Yes RuddLillette Boxer, MD  metoprolol (TOPROL-XL) 200 MG 24 hr tablet TAKE 1  TABLET (200 MG TOTAL) BY MOUTH DAILY TAKE WITH OR IMMEDIATELY FOLLOWING A MEAL. Patient taking differently: Take 200 mg by mouth daily after breakfast. 05/03/20 05/03/21 Yes Hochrein, Jeneen Rinks, MD  pantoprazole (PROTONIX) 40 MG tablet Take 1 tablet (40 mg total) by mouth daily. Patient taking differently: Take 40 mg by mouth daily before breakfast. 03/27/20  Yes Haydee Salter, MD  sacubitril-valsartan (ENTRESTO) 97-103 MG TAKE 1 TABLET BY MOUTH 2 (TWO) TIMES DAILY. Patient taking differently: Take 1 tablet by mouth in the morning and at bedtime. 04/03/20 04/03/21 Yes [name], MD  zolpidem (AMBIEN) 5 MG tablet Take 1 tablet (5 mg total) by mouth at bedtime as needed for sleep. 09/05/20  Yes Haydee Salter, MD  Blood Glucose Monitoring Suppl (TRUE METRIX AIR GLUCOSE METER) w/Device KIT 1 kit by Does not apply route 2 (two) times daily. 12/02/17   [name], [name], MD  glucose blood (TRUE METRIX BLOOD GLUCOSE TEST) test strip Use as instructed to check blood sugars twice daily and as needed 05/16/20   Haydee Salter, MD  potassium chloride SA (KLOR-CON) 20 MEQ tablet Take 1 tablet (20 mEq total) by mouth daily. 09/21/20   Cheryln Manly, NP  rosuvastatin (CRESTOR) 20 MG tablet Take 1 tablet (20 mg total) by mouth daily. 09/22/20   Cheryln Manly, NP  TRUEPLUS LANCETS 28G MISC Use twice daily to check blood sugars and as needed 12/02/17   [name], MD    Allergies    Jardiance [empagliflozin]  Review of Systems   Review of Systems  Constitutional:  Negative for chills and fever.  HENT:  Negative for ear pain and sore throat.   Eyes:  Negative for pain and visual disturbance.  Respiratory:  Negative for cough and shortness of breath.   Cardiovascular:  Positive for palpitations. Negative for chest pain.  Gastrointestinal:  Negative for abdominal pain, nausea and vomiting.  Genitourinary:  Negative for dysuria and hematuria.  Musculoskeletal:  Negative for arthralgias, back pain and neck  pain.  Skin:  Negative for color change and rash.  Neurological:  Positive for light-headedness. Negative for seizures and syncope.  All other systems reviewed and are negative.  Physical Exam Updated Vital Signs BP 118/79 (BP Location: Right Arm)   Pulse 68   Temp 97.8 F (36.6 C) (Oral)   Resp 18   Ht 5' 8.75" (1.746 m)   Wt 116.5 kg   SpO2 97%   BMI 38.21 kg/m   Physical Exam Vitals and nursing note reviewed.  Constitutional:      General: He is not in acute distress.    Appearance: Normal appearance. He is well-developed. He is not ill-appearing, toxic-appearing or diaphoretic.  HENT:     Head: Normocephalic and atraumatic.     Right Ear: External ear normal.     Left Ear: External ear normal.     Nose: Nose normal.  Mouth/Throat:     Mouth: Mucous membranes are moist.     Pharynx: Oropharynx is clear.  Eyes:     Conjunctiva/sclera: Conjunctivae normal.  Cardiovascular:     Rate and Rhythm: Tachycardia present. Rhythm irregular.     Heart sounds: No murmur heard. Pulmonary:     Effort: Pulmonary effort is normal. No respiratory distress.     Breath sounds: Normal breath sounds. No wheezing or rales.  Abdominal:     Palpations: Abdomen is soft.     Tenderness: There is no abdominal tenderness.  Musculoskeletal:     Cervical back: Normal range of motion and neck supple.     Right lower leg: No edema.     Left lower leg: No edema.  Skin:    General: Skin is warm and dry.     Coloration: Skin is not jaundiced or pale.  Neurological:     General: No focal deficit present.     Mental Status: He is alert and oriented to person, place, and time.     Cranial Nerves: No cranial nerve deficit.     Sensory: No sensory deficit.     Motor: No weakness.  Psychiatric:        Mood and Affect: Mood normal.        Behavior: Behavior normal.        Thought Content: Thought content normal.        Judgment: Judgment normal.    ED Results / Procedures / Treatments    Labs (all labs ordered are listed, but only abnormal results are displayed) Labs Reviewed  MAGNESIUM - Abnormal; Notable for the following components:      Result Value   Magnesium 1.6 (*)    All other components within normal limits  COMPREHENSIVE METABOLIC PANEL - Abnormal; Notable for the following components:   Potassium 2.8 (*)    Glucose, Bld 161 (*)    Calcium 8.7 (*)    All other components within normal limits  CBC WITH DIFFERENTIAL/PLATELET - Abnormal; Notable for the following components:   WBC 14.2 (*)    MCV 79.1 (*)    MCH 25.9 (*)    Lymphs Abs 5.7 (*)    Eosinophils Absolute 0.7 (*)    All other components within normal limits  GLUCOSE, CAPILLARY - Abnormal; Notable for the following components:   Glucose-Capillary 156 (*)    All other components within normal limits  CBC - Abnormal; Notable for the following components:   WBC 11.1 (*)    MCV 79.4 (*)    MCH 25.5 (*)    All other components within normal limits  URINALYSIS, COMPLETE (UACMP) WITH MICROSCOPIC - Abnormal; Notable for the following components:   Color, Urine STRAW (*)    Hgb urine dipstick SMALL (*)    All other components within normal limits  GLUCOSE, CAPILLARY - Abnormal; Notable for the following components:   Glucose-Capillary 151 (*)    All other components within normal limits  BASIC METABOLIC PANEL - Abnormal; Notable for the following components:   Sodium 134 (*)    Glucose, Bld 110 (*)    All other components within normal limits  CBC - Abnormal; Notable for the following components:   MCV 79.2 (*)    MCH 25.3 (*)    All other components within normal limits  GLUCOSE, CAPILLARY - Abnormal; Notable for the following components:   Glucose-Capillary 132 (*)    All other components within normal limits  GLUCOSE, CAPILLARY -  Abnormal; Notable for the following components:   Glucose-Capillary 125 (*)    All other components within normal limits  GLUCOSE, CAPILLARY - Abnormal; Notable  for the following components:   Glucose-Capillary 125 (*)    All other components within normal limits  CBG MONITORING, ED - Abnormal; Notable for the following components:   Glucose-Capillary 156 (*)    All other components within normal limits  TROPONIN I (HIGH SENSITIVITY) - Abnormal; Notable for the following components:   Troponin I (High Sensitivity) 61 (*)    All other components within normal limits  TROPONIN I (HIGH SENSITIVITY) - Abnormal; Notable for the following components:   Troponin I (High Sensitivity) 73 (*)    All other components within normal limits  TROPONIN I (HIGH SENSITIVITY) - Abnormal; Notable for the following components:   Troponin I (High Sensitivity) 67 (*)    All other components within normal limits  RESP PANEL BY RT-PCR (FLU A&B, COVID) ARPGX2  PROTIME-INR  APTT  CREATININE, SERUM  POTASSIUM  TROPONIN I (HIGH SENSITIVITY)    EKG EKG Interpretation  Date/Time:  Wednesday September 20 2020 07:14:39 EDT Ventricular Rate:  133 PR Interval:  128 QRS Duration: 108 QT Interval:  403 QTC Calculation: 600 R Axis:   -24 Text Interpretation: Sinus tachycardia Borderline left axis deviation Low voltage, extremity and precordial leads Prolonged QT interval Confirmed by Godfrey Pick [id number]) on 09/20/2020 7:20:48 AM  Radiology DG Chest Portable 1 View  Result Date: 09/20/2020 CLINICAL DATA:  tachycardia EXAM: PORTABLE CHEST 1 VIEW COMPARISON:  April 26, 2020. FINDINGS: The heart size and mediastinal contours are within normal limits. Both lungs are clear. No visible pleural effusions or pneumothorax. No acute osseous abnormality. IMPRESSION: No active disease. Electronically Signed   By: Margaretha Sheffield M.D.   On: 09/20/2020 07:35    Procedures Procedures   Medications Ordered in ED Medications  acetaminophen (TYLENOL) tablet 650 mg (has no administration in time range)  heparin injection 5,000 Units (5,000 Units Subcutaneous Given 09/21/20 0543)  metoprolol  succinate (TOPROL-XL) 24 hr tablet 200 mg (200 mg Oral Given 09/21/20 0906)  sacubitril-valsartan (ENTRESTO) 97-103 mg per tablet (1 tablet Oral Given 09/21/20 0906)  pantoprazole (PROTONIX) EC tablet 40 mg (40 mg Oral Given 09/21/20 0907)  rosuvastatin (CRESTOR) tablet 20 mg (20 mg Oral Given 09/21/20 0906)  insulin aspart (novoLOG) injection 0-9 Units (1 Units Subcutaneous Given 09/21/20 1239)  sodium chloride 0.9 % bolus 500 mL (500 mLs Intravenous Bolus 09/20/20 0720)  magnesium sulfate (IV Push/IM) injection 1 g (1 g Intravenous Given 09/20/20 0718)  magnesium sulfate (IV Push/IM) injection 1 g (1 g Intravenous Given 09/20/20 0730)  potassium chloride 10 mEq in 100 mL IVPB (10 mEq Intravenous New Bag/Given 09/20/20 1007)  potassium chloride SA (KLOR-CON) CR tablet 20 mEq (20 mEq Oral Given 09/21/20 1130)    ED Course  I have reviewed the triage vital signs and the nursing notes.  Pertinent labs & imaging results that were available during my care of the patient were reviewed by me and considered in my medical decision making (see chart for details).    MDM Rules/Calculators/A&P                         CRITICAL CARE Performed by: Godfrey Pick   Total critical care time: 40 minutes  Critical care time was exclusive of separately billable procedures and treating other patients.  Critical care was necessary to treat or prevent  imminent or life-threatening deterioration.  Critical care was time spent personally by me on the following activities: development of treatment plan with patient and/or surrogate as well as nursing, discussions with consultants, evaluation of patient's response to treatment, examination of patient, obtaining history from patient or surrogate, ordering and performing treatments and interventions, ordering and review of laboratory studies, ordering and review of radiographic studies, pulse oximetry and re-evaluation of patient's condition.   Patient is a 55 year old male who  presents to the ED with wide-complex tachycardia.  He endorses palpitations and lightheadedness.  He denies any other current symptoms.  Patient is alert and oriented upon arrival.  Patient was placed on bedside cardiac monitor immediately.  EKG showed concern of ventricular tachycardia versus torsades de points.  ZOLL pads were placed onto patient in preparation of possible cardioversion.  Blood pressures remained high.  Given his stability at this time and minimal symptoms, no emergent cardioversion is indicated.  Labs were drawn.  Chart was reviewed and it does not appear the patient has had tacky dysrhythmias in the past.  Previous EKGs show occasional PVCs only.  Patient takes a 200 mg dose of long-acting metoprolol and did take it this morning.  Most recent EF, measured in March was 50 to 55%.  Patient was given 2 separate doses of magnesium (1 g) form.  Treatment of torsades.  Following the first dose, he had improvement in his heart rhythm.  Rate was in the range of 120s.  QRS complex was narrow with occasional runs of wide-complex tachycardia.  After the second dose of magnesium, he had further improvement.  Repeat EKG showed regular rate but it is unclear if P waves are present.  Patient does not have a known history of atrial fibrillation.  Patient endorsed improved symptoms of palpitations and lightheadedness.  At this time, I did speak with cardiologist on-call, Dr. [place].  Plan will be for checking electrolytes and admitting for observation.  Laboratory results showed hypokalemia.  Replacement potassium (IV and p.o.) was ordered.  Initial troponin was mildly elevated at 61.  Patient was admitted to cardiology service at [facility].  While awaiting transport, he had sustained improvement with narrow complex QRSs on repeat EKGs and on the monitor and a heart rate in the range of 100s.  He also endorsed resolution of symptoms.  Patient was transported in stable condition.  Final Clinical Impression(s)  / ED Diagnoses Final diagnoses:  Tachyarrhythmia    Rx / DC Orders ED Discharge Orders          Ordered    rosuvastatin (CRESTOR) 20 MG tablet  Daily        09/21/20 1019    potassium chloride SA (KLOR-CON) 20 MEQ tablet  Daily        09/21/20 1019    Increase activity slowly        09/21/20 1103    Diet - low sodium heart healthy        09/21/20 1103    Amb referral to [facility]        09/20/20 1310             Godfrey Pick, MD 09/21/20 1549

## 2020-09-20 NOTE — ED Notes (Signed)
[facility] TEAM AT BEDSIDE 

## 2020-09-21 ENCOUNTER — Other Ambulatory Visit: Payer: Self-pay | Admitting: Cardiology

## 2020-09-21 DIAGNOSIS — Z79899 Other long term (current) drug therapy: Secondary | ICD-10-CM

## 2020-09-21 DIAGNOSIS — E876 Hypokalemia: Secondary | ICD-10-CM | POA: Diagnosis not present

## 2020-09-21 DIAGNOSIS — I472 Ventricular tachycardia: Secondary | ICD-10-CM | POA: Diagnosis not present

## 2020-09-21 LAB — BASIC METABOLIC PANEL
Anion gap: 7 (ref 5–15)
BUN: 9 mg/dL (ref 6–20)
CO2: 22 mmol/L (ref 22–32)
Calcium: 9 mg/dL (ref 8.9–10.3)
Chloride: 105 mmol/L (ref 98–111)
Creatinine, Ser: 0.82 mg/dL (ref 0.61–1.24)
GFR, Estimated: >60 mL/min (ref >60)
Glucose, Bld: 110 mg/dL — ABNORMAL HIGH (ref 70–99)
Potassium: 3.7 mmol/L (ref 3.5–5.1)
Sodium: 134 mmol/L — ABNORMAL LOW (ref 135–145)

## 2020-09-21 LAB — CBC
HCT: 43.8 % (ref 39.0–52.0)
Hemoglobin: 14 g/dL (ref 13.0–17.0)
MCH: 25.3 pg — ABNORMAL LOW (ref 26.0–34.0)
MCHC: 32 g/dL (ref 30.0–36.0)
MCV: 79.2 fL — ABNORMAL LOW (ref 80.0–100.0)
Platelets: 272 10*3/uL (ref 150–400)
RBC: 5.53 MIL/uL (ref 4.22–5.81)
RDW: 14.1 % (ref 11.5–15.5)
WBC: 10.4 10*3/uL (ref 4.0–10.5)
nRBC: 0 % (ref 0.0–0.2)

## 2020-09-21 LAB — GLUCOSE, CAPILLARY
Glucose-Capillary: 125 mg/dL — ABNORMAL HIGH (ref 70–99)
Glucose-Capillary: 125 mg/dL — ABNORMAL HIGH (ref 70–99)

## 2020-09-21 MED ORDER — POTASSIUM CHLORIDE CRYS ER 20 MEQ PO TBCR: 20.0000 meq | EXTENDED_RELEASE_TABLET | Freq: Every day | ORAL | 2 refills | Status: DC

## 2020-09-21 MED ORDER — POTASSIUM CHLORIDE CRYS ER 20 MEQ PO TBCR
20.0000 meq | EXTENDED_RELEASE_TABLET | Freq: Once | ORAL | Status: AC
  Administered 2020-09-21: 20 meq via ORAL
  Filled 2020-09-21: qty 1

## 2020-09-21 MED ORDER — ROSUVASTATIN CALCIUM 20 MG PO TABS: 20.0000 mg | ORAL_TABLET | Freq: Every day | ORAL | 0 refills | Status: DC

## 2020-09-21 NOTE — Discharge Summary (Signed)
Discharge Summary    Patient ID: Bryan English. MRN: [id number]; DOB: 16-Feb-1965  Admit date: 09/20/2020 Discharge date: 09/21/2020  PCP:  Bryan Salter, MD   [place] [facility] Cardiologist:  Bryan Breeding, MD   {  Discharge Diagnoses    Active Problems:   Tachycardia   Tachyarrhythmia   Ventricular tachycardia, polymorphic ([place])   Hypokalemia   Hypomagnesemia    Diagnostic Studies/Procedures    N/a  _____________   History of Present Illness     Bryan English. is a 55 y.o. male with PMH of nonobstructive CAD from cath 2019, history of nonischemic cardiomyopathy with recovered EF, HTN, HLD, type 2 DM, GERD, obesity, who was seen 09/20/2020 for the evaluation of A fib RVR.  [name] follows Dr Bryan English outpatient since 2019.  He was seen initially seen for chest pain. Echo from 10/22/2017 showed EF 20-25%, diffuse hypokinesis, grade II DD, LA moderate to severely dilated, mild MR and trivial TR. He underwent cardiac catheterization on 10/29/2017 which showed no significant obstructive CAD, with a smooth 25-30% prox RCA narrowing and normal left coronary circulation. LVEDP was 17 mmHg. Etiology for CM was non-ischemic.  He was placed on GDMT with Metoprolol XL $RemoveBefor'100mg'[id number]$  and lisinopril $RemoveBefore'10mg'bWzNQmkpkQBOe$ . Echo repeated on 01/25/2019 showed improved EF to 50-55%, average left ventricular global longitudinal strain is -15.9 %, grade I DD, LV global hypokinesis, and no valvular disease. He was later switched to Entresto 97/103 BID and increased metorpolol XL to $Rem'200mg'ImXu$  daily for GDMT.    Inpatient cardiology consult was done on 04/27/2020 while he was admitted for chest pain, trop was flat and not consistent with ACS, no further ischemic work up was recommended. Echo from March 2022 showed stable LV function and grade I DD. Patient was continued on home dose GDMT. He was noted to have some frequent PVCs and no significant arrhythmia noted on telemetry. Event monitor was  recommended to evaluate PVC burden.    He was last seen in the office on 07/04/20, BP was mildly elevated at the visit. Event monitor was reviewed, noted an episode of NSVT, bradycardia episode was noted but not sustained. No change of meds was recommended.    Patient presented to ER at [facility] for complaints of irregular heartbeat.  He reported feeling lightheaded since last night, symptom were slightly improved the morning of admission.  He has a pulse oximeter that he uses to monitor his heart rate and oxygen, noted his heart rate was 100s, which was faster than his baseline 70s.  He had experienced heart palpitation with irregular heartbeat sensation.  In route to ER, he noted his heart rate was up to 200s.  He is in his normal state of health until today.  Recently changed his diet to low carbohydrate and high vegetable/fruit.  He noted he was having more loose stool average twice daily than usual.  He does not take any diuretics at baseline.  He reported having normal oral intake over the past week, did have dehydration 1 month ago where he visited his PCP.  His heart palpitation had resolved, denied any chest pain/pressure, syncope, current dizziness, weight gain or loss, orthopnea.   Admission diagnostic revealed severe hypokalemia 2.8, hypomagnesemia 1.6, CMP otherwise unremarkable.  High sensitive troponin 5 >61.  CBC differential revealed leukocytosis with WBC 14 200, mild lymphocytosis.  INR 1.  Flu and COVID swab negative.  Chest x-ray revealed no acute finding.  Initial EKG showed narrowed complex  tachycardia with concern of polymorphic ventricular tachycardia versus artifact. Repeat EKG showed sinus tachycardia 133 bpm with prolonged QT.    He was tachycardiac with HR up to 170s at ED, hypertensive with BP up to 170/141,  required 2-4L [place] oxygen support. He was given 2g Mag IV and potassium 40 meq at ED. Subsequently he is tras[facility] [place] to [facility] for further  evaluation by cardiology.   Hospital Course     Sinus tachycardia/Polymorphic ventricular tachycardia/QT prolongation: Presented to ER with irregular heartbeat and heart palpitation and lightheadedness Initial EKG concerning for polymorphic VT, repeat EKG revealed sinus tachycardia 130s with prolonged QT, manual QTC 596 msec. Suspect severe hypokalemia/hypomagnesemia induced cardiac arrhythmia -- no further arrhythmia noted on monitor and QTc is normal.  -- continue beta blocker, along with K+ supplement -- follow up in office with BMET and mag level   Elevated troponin with flat trend/Nonobstructive CAD: trend was not consistent with ACS -- denied any chest pain or SOB -- cath without obstructive CAD from 10/29/2017    Severe hypokalemia/Hypomagnesemia: Patient reported recent diet change to high vegetables/fruits and low-carb, subsequent increased loose stool twice daily, not taking any diuretic, no clear cause of hypokalemia at this time -- K 2.8 Mag 1.6 POA, s/p replacement with improvement to 3.7 prior to discharge -- K+ supplement at DC   History of nonischemic cardiomyopathy with recovered EF: No clinical signs of CHF decompensation -- Echo from 03/2020 with EF 50 to 55%, no RMWA, no significant valvular disease, mild dilatation of aortic root 43 mm and ascending aorta measuring 38 mm -- Continue GDMT with Entresto and metoprolol -- Of note has been tried on Jardiance but stopped in the setting of dizziness. He is willing to retry, consider resumption at outpatient follow up appt   Hyperlipidemia: LDL 156, total cholesterol 217, HDL 29, triglycerides 154 from lab on 09/05/2020 -- started on Crestor $RemoveBe'20mg'ulkTiUefP$  daily -- FLP/LFTs in 8 weeks   HTN: Blood pressure well controlled -- continue on Toprol and Entresto   Type 2 diabetes: Hemoglobin A1c 6.8% from 09/05/2020, fair control -- resume metformin at DC  Did the patient have an acute coronary syndrome (MI, NSTEMI, STEMI, etc) this  admission?:  No.   The elevated Troponin was due to the acute medical illness (demand ischemia).      _____________  Discharge Vitals Blood pressure 118/79, pulse 68, temperature 97.8 F (36.6 C), temperature source Oral, resp. rate 18, height 5' 8.75" (1.746 m), weight 116.5 kg, SpO2 97 %.  Filed Weights   09/20/20 0659 09/20/20 1126 09/21/20 0027  Weight: 114.8 kg 117.3 kg 116.5 kg    Labs & Radiologic Studies    CBC Recent Labs    09/20/20 0706 09/20/20 1126 09/21/20 0313  WBC 14.2* 11.1* 10.4  NEUTROABS 6.7  --   --   HGB 14.5 14.5 14.0  HCT 44.2 45.2 43.8  MCV 79.1* 79.4* 79.2*  PLT 323 305 818   Basic Metabolic Panel Recent Labs    09/20/20 0706 09/20/20 1126 09/20/20 1407 09/21/20 0313  NA 136  --   --  134*  K 2.8*  --  4.7 3.7  CL 104  --   --  105  CO2 22  --   --  22  GLUCOSE 161*  --   --  110*  BUN 17  --   --  9  CREATININE 1.03 0.91  --  0.82  CALCIUM 8.7*  --   --  9.0  MG 1.6*  --   --   --    Liver Function Tests Recent Labs    09/20/20 0706  AST 22  ALT 21  ALKPHOS 67  BILITOT 0.4  PROT 7.8  ALBUMIN 4.0   No results for input(s): LIPASE, AMYLASE in the last 72 hours. High Sensitivity Troponin:   Recent Labs  Lab 09/20/20 0706 09/20/20 1126 09/20/20 1407 09/20/20 2037  TROPONINIHS 5 61* 73* 67*    BNP Invalid input(s): POCBNP D-Dimer No results for input(s): DDIMER in the last 72 hours. Hemoglobin A1C No results for input(s): HGBA1C in the last 72 hours. Fasting Lipid Panel No results for input(s): CHOL, HDL, LDLCALC, TRIG, CHOLHDL, LDLDIRECT in the last 72 hours. Thyroid Function Tests No results for input(s): TSH, T4TOTAL, T3FREE, THYROIDAB in the last 72 hours.  Invalid input(s): FREET3 _____________  DG Chest Portable 1 View  Result Date: 09/20/2020 CLINICAL DATA:  tachycardia EXAM: PORTABLE CHEST 1 VIEW COMPARISON:  April 26, 2020. FINDINGS: The heart size and mediastinal contours are within normal limits. Both  lungs are clear. No visible pleural effusions or pneumothorax. No acute osseous abnormality. IMPRESSION: No active disease. Electronically Signed   By: Margaretha Sheffield M.D.   On: 09/20/2020 07:35   Disposition   Pt is being discharged home today in good condition.  Follow-up Plans & Appointments     Follow-up Information     Almyra Deforest, [place] Follow up on 10/03/2020.   Specialties: Cardiology, Radiology Why: at 10:15am for your follow up appt with Dr. Rosezella Florida PA [facility] information: [place] [address] [address] [place] Follow up on 09/26/2020.   Specialty: Cardiology Why: Please come in for follow up labs between 8:30-4pm Contact information: [address] [place] [phone]               Discharge Instructions     Amb referral to [facility]   Complete by: As directed    Diet - low sodium heart healthy   Complete by: As directed    Increase activity slowly   Complete by: As directed        Discharge Medications   Allergies as of 09/21/2020       Reactions   Jardiance [empagliflozin] Other (See Comments)   Vertigo        Medication List     STOP taking these medications    empagliflozin 10 MG Tabs tablet Commonly known as: Jardiance       TAKE these medications    clobetasol cream 0.05 % Commonly known as: TEMOVATE Apply 1 application topically 2 (two) times daily. What changed:  when to take this reasons to take this   Entresto 97-103 MG Generic drug: sacubitril-valsartan TAKE 1 TABLET BY MOUTH 2 (TWO) TIMES DAILY. What changed: when to take this   fluticasone 50 MCG/ACT nasal spray Commonly known as: FLONASE Place 1 spray into both nostrils 2 (two) times daily. Reduce to once a day, after 1 month. What changed:  when to take this reasons to take this additional instructions   metFORMIN 1000 MG tablet Commonly known as:  GLUCOPHAGE TAKE 1 TABLET (1,000 MG TOTAL) BY MOUTH EVERY EVENING. What changed:  how much to take how to take this when to take this   metoprolol 200 MG 24 hr tablet Commonly known as: TOPROL-XL TAKE 1 TABLET (200 MG TOTAL) BY  MOUTH DAILY TAKE WITH OR IMMEDIATELY FOLLOWING A MEAL. What changed:  how much to take how to take this when to take this   pantoprazole 40 MG tablet Commonly known as: PROTONIX Take 1 tablet (40 mg total) by mouth daily. What changed: when to take this   potassium chloride SA 20 MEQ tablet Commonly known as: KLOR-CON Take 1 tablet (20 mEq total) by mouth daily.   rosuvastatin 20 MG tablet Commonly known as: CRESTOR Take 1 tablet (20 mg total) by mouth daily. Start taking on: September 22, 2020   True Metrix Air Glucose Meter w/Device Kit 1 kit by Does not apply route 2 (two) times daily.   True Metrix Blood Glucose Test test strip Generic drug: glucose blood Use as instructed to check blood sugars twice daily and as needed   TRUEplus Lancets 28G Misc Use twice daily to check blood sugars and as needed   zolpidem 5 MG tablet Commonly known as: AMBIEN Take 1 tablet (5 mg total) by mouth at bedtime as needed for sleep.        Outstanding Labs/Studies   BMET/Mag 9/13  FLP/LFTs in 8 weeks  Duration of Discharge Encounter   Greater than 30 minutes including physician time.  Signed, Reino Bellis, NP 09/21/2020, 11:05 AM

## 2020-09-21 NOTE — Plan of Care (Signed)

## 2020-09-21 NOTE — Plan of Care (Signed)

## 2020-09-21 NOTE — Progress Notes (Signed)
Progress Note  Patient Name: Bryan English. Date of Encounter: 09/21/2020  [facility] Cardiologist: [name], MD   Subjective   Feels great. No palpitations, dizziness.   Inpatient Medications    Scheduled Meds:  heparin  5,000 Units Subcutaneous Q8H   insulin aspart  0-9 Units Subcutaneous TID WC   metoprolol  200 mg Oral Daily   pantoprazole  40 mg Oral Daily   potassium chloride  40 mEq Oral BID   rosuvastatin  20 mg Oral Daily   sacubitril-valsartan  1 tablet Oral BID   Continuous Infusions:  PRN Meds: acetaminophen   Vital Signs    Vitals:   09/20/20 2142 09/21/20 0027 09/21/20 0559 09/21/20 0838  BP: 127/86 115/81 110/73 118/79  Pulse: 73 71 66 68  Resp:  '17 18 18  '$ Temp:  98.9 F (37.2 C) 98.5 F (36.9 C) 97.8 F (36.6 C)  TempSrc:  Oral Oral Oral  SpO2:  92% 97% 97%  Weight:  116.5 kg    Height:        Intake/Output Summary (Last 24 hours) at 09/21/2020 0912 Last data filed at 09/20/2020 1552 Gross per 24 hour  Intake 237 ml  Output 700 ml  Net -463 ml   Last 3 Weights 09/21/2020 09/20/2020 09/20/2020  Weight (lbs) 256 lb 14.4 oz 258 lb 9.6 oz 253 lb  Weight (kg) 116.529 kg 117.3 kg 114.76 kg      Telemetry    NSR. Normal QTc - Personally Reviewed  ECG    None today - Personally Reviewed  Physical Exam   GEN: No acute distress.   Neck: No JVD Cardiac: RRR, no murmurs, rubs, or gallops.  Respiratory: Clear to auscultation bilaterally. GI: Soft, nontender, non-distended  MS: No edema; No deformity. Neuro:  Nonfocal  Psych: Normal affect   Labs    High Sensitivity Troponin:   Recent Labs  Lab 09/20/20 0706 09/20/20 1126 09/20/20 1407 09/20/20 2037  TROPONINIHS 5 61* 73* 67*      Chemistry Recent Labs  Lab 09/20/20 0706 09/20/20 1126 09/20/20 1407 09/21/20 0313  NA 136  --   --  134*  K 2.8*  --  4.7 3.7  CL 104  --   --  105  CO2 22  --   --  22  GLUCOSE 161*  --   --  110*  BUN 17  --   --  9   CREATININE 1.03 0.91  --  0.82  CALCIUM 8.7*  --   --  9.0  PROT 7.8  --   --   --   ALBUMIN 4.0  --   --   --   AST 22  --   --   --   ALT 21  --   --   --   ALKPHOS 67  --   --   --   BILITOT 0.4  --   --   --   GFRNONAA >60 >60  --  >60  ANIONGAP 10  --   --  7     Hematology Recent Labs  Lab 09/20/20 0706 09/20/20 1126 09/21/20 0313  WBC 14.2* 11.1* 10.4  RBC 5.59 5.69 5.53  HGB 14.5 14.5 14.0  HCT 44.2 45.2 43.8  MCV 79.1* 79.4* 79.2*  MCH 25.9* 25.5* 25.3*  MCHC 32.8 32.1 32.0  RDW 14.1 14.1 14.1  PLT 323 305 272    BNPNo results for input(s): BNP, PROBNP in the last  168 hours.   DDimer No results for input(s): DDIMER in the last 168 hours.   Radiology    DG Chest Portable 1 View  Result Date: 09/20/2020 CLINICAL DATA:  tachycardia EXAM: PORTABLE CHEST 1 VIEW COMPARISON:  April 26, 2020. FINDINGS: The heart size and mediastinal contours are within normal limits. Both lungs are clear. No visible pleural effusions or pneumothorax. No acute osseous abnormality. IMPRESSION: No active disease. Electronically Signed   By: Margaretha Sheffield M.D.   On: 09/20/2020 07:35    Cardiac Studies   Event monitor 06/15/20:   Patch Wear Time: 13 days and 22 hours (2022-04-21T13:28:18-0400 to 2022-05-05T11:44:43-0400) Patient had a min HR of 51 bpm, max HR of 169 bpm, and avg HR of 72 bpm. Predominant underlying rhythm was Sinus Rhythm. 1 run of Ventricular Tachycardia occurred lasting 7 beats with a max rate of 169 bpm (avg 151 bpm). Idioventricular Rhythm was present. Isolated SVEs were rare (<1.0%), SVE Couplets were rare (<1.0%), and SVE Triplets were rare (<1.0%). Isolated VEs were occasional (1.5%, 21676), VE Couplets were rare (<1.0%, 184), and VE Triplets were rare (<1.0%, 15). Ventricular Bigeminy and Trigeminy were present.   Normal sinus rhythm One run of non sustained ventricular tachycardia lasting 7 beats. Rare premature ventricular contractions Rare premature atrial  contractions.  No sustained arrhythmias     Echo on 03/15/2020:    1. Left ventricular ejection fraction, by estimation, is 50 to 55%. Left  ventricular ejection fraction by 3D volume is 55 %. The left ventricle has  low normal function. The left ventricle has no regional wall motion  abnormalities. There is mild left  ventricular hypertrophy. Left ventricular diastolic parameters are  consistent with Grade I diastolic dysfunction (impaired relaxation). The  average left ventricular global longitudinal strain is -20.0 %.   2. Right ventricular systolic function is normal. The right ventricular  size is normal. Tricuspid regurgitation signal is inadequate for assessing  PA pressure.   3. The mitral valve is normal in structure. No evidence of mitral valve  regurgitation.   4. The aortic valve is tricuspid. Aortic valve regurgitation is not  visualized. No aortic stenosis is present.   5. Aortic dilatation noted. There is mild dilatation of the aortic root,  measuring 43 mm. There is mild dilatation of the ascending aorta,  measuring 38 mm.   6. The inferior vena cava is normal in size with greater than 50%  respiratory variability, suggesting right atrial pressure of 3 mmHg.   Comparison(s): 01/25/19 EF 50-55%. GLS -15.9%.    LHC on 10/29/2017:   No significant coronary obstructive disease with smooth 25 -30% proximal RCA narrowing and a normal left coronary circulation.   LVEDP 17 mmHg.  Previous echo Doppler documented EF of 20 to 25%.   RECOMMENDATION: Guideline directed medical therapy for  nonischemic cardiomyopathy.  The patient will follow-up with Dr. Percival Spanish for further evaluation and treatment. Recommend Aspirin '81mg'$  daily for moderate CAD.  Patient Profile     55 y.o. male with PMH of nonobstructive CAD from cath 2019, history of nonischemic cardiomyopathy with recovered EF, HTN, HLD, type 2 DM, GERD, obesity, who is being seen 09/20/2020 for the evaluation of  arrhythmia  Assessment & Plan    Sinus tachycardia Polymorphic ventricular tachycardia QT prolongation Hx of NSVT -Presented to ER with irregular heartbeat and heart palpitation and lightheadedness -Initial EKG concerning for polymorphic VT, repeat EKG revealed sinus tachycardia 130s with prolonged QT, manual QTC 596 msec -suspect severe hypokalemia/hypomagnesemia  induced cardiac arrhythmia - s/p potassium and magnesium repletion - no further arrhythmia noted on monitor and QTc is normal.  - continue beta blocker. Plan DC home today on potassium supplement.  - follow up in office with BMET and mag level   Elevated troponin with flat trend  Nonobstructive CAD - trend is not consistent with ACS - denied any chest pain or SOB, see symptom as above  - cath without obstructive CAD from 10/29/2017    Severe hypokalemia Hypomagnesemia -Patient endorses recent diet change to high vegetables/fruits and low-carb, subsequent increased loose stool twice daily, not taking any diuretic, no clear cause of hypokalemia at this time - K 2.8 Mag 1.6 POA, s/p replacement - potassium 3.7 today. Will add potassium supplement at DC    History of nonischemic cardiomyopathy with recovered EF -No clinical signs of CHF decompensation -Echo from 03/2020 with EF 50 to 55%, no RMWA, no significant valvular disease, mild dilatation of aortic root 43 mm and ascending aorta measuring 38 mm -Continue GDMT with Entresto and metoprolol and Jardiance   Hyperlipidemia -LDL 156, total cholesterol 217, HDL 29, triglycerides 154 from lab on 09/05/2020 -with nonobstructive CAD will initiate Crestor '20mg'$  daily , not on meds at home    HTN -Blood pressure well controlled.   Type 2 diabetes -Hemoglobin A1c 6.8% from 09/05/2020, fair control - will hold metformin, resume Jardiance -Will initiate Humalog insulin SSI AC for hyperglycemia if needed  - [place] home today   For questions or updates, please  contact [place] Please consult [email or web address] for contact info under        Signed, Raider Valbuena [place], MD  09/21/2020, 9:12 AM

## 2020-09-24 ENCOUNTER — Other Ambulatory Visit: Payer: Self-pay

## 2020-09-24 ENCOUNTER — Emergency Department (HOSPITAL_BASED_OUTPATIENT_CLINIC_OR_DEPARTMENT_OTHER): Payer: 59

## 2020-09-24 ENCOUNTER — Emergency Department (HOSPITAL_BASED_OUTPATIENT_CLINIC_OR_DEPARTMENT_OTHER)
Admission: EM | Admit: 2020-09-24 | Discharge: 2020-09-24 | Disposition: A | Discharge status: Home / Self Care | Payer: 59 | Attending: Emergency Medicine | Admitting: Emergency Medicine

## 2020-09-24 ENCOUNTER — Encounter (HOSPITAL_BASED_OUTPATIENT_CLINIC_OR_DEPARTMENT_OTHER): Payer: Self-pay | Admitting: Emergency Medicine

## 2020-09-24 DIAGNOSIS — Z7984 Long term (current) use of oral hypoglycemic drugs: Secondary | ICD-10-CM | POA: Insufficient documentation

## 2020-09-24 DIAGNOSIS — Z79899 Other long term (current) drug therapy: Secondary | ICD-10-CM | POA: Insufficient documentation

## 2020-09-24 DIAGNOSIS — E785 Hyperlipidemia, unspecified: Secondary | ICD-10-CM | POA: Diagnosis not present

## 2020-09-24 DIAGNOSIS — E860 Dehydration: Secondary | ICD-10-CM

## 2020-09-24 DIAGNOSIS — R42 Dizziness and giddiness: Secondary | ICD-10-CM | POA: Diagnosis present

## 2020-09-24 DIAGNOSIS — R197 Diarrhea, unspecified: Secondary | ICD-10-CM

## 2020-09-24 DIAGNOSIS — E1169 Type 2 diabetes mellitus with other specified complication: Secondary | ICD-10-CM | POA: Insufficient documentation

## 2020-09-24 DIAGNOSIS — Z20822 Contact with and (suspected) exposure to covid-19: Secondary | ICD-10-CM | POA: Diagnosis not present

## 2020-09-24 DIAGNOSIS — I10 Essential (primary) hypertension: Secondary | ICD-10-CM | POA: Insufficient documentation

## 2020-09-24 LAB — CBC WITH DIFFERENTIAL/PLATELET
Abs Immature Granulocytes: 0.04 10*3/uL (ref 0.00–0.07)
Basophils Absolute: 0.1 10*3/uL (ref 0.0–0.1)
Basophils Relative: 0 %
Eosinophils Absolute: 0.6 10*3/uL — ABNORMAL HIGH (ref 0.0–0.5)
Eosinophils Relative: 4 %
HCT: 47 % (ref 39.0–52.0)
Hemoglobin: 15.5 g/dL (ref 13.0–17.0)
Immature Granulocytes: 0 %
Lymphocytes Relative: 32 %
Lymphs Abs: 4.1 10*3/uL — ABNORMAL HIGH (ref 0.7–4.0)
MCH: 25.7 pg — ABNORMAL LOW (ref 26.0–34.0)
MCHC: 33 g/dL (ref 30.0–36.0)
MCV: 78.1 fL — ABNORMAL LOW (ref 80.0–100.0)
Monocytes Absolute: 0.8 10*3/uL (ref 0.1–1.0)
Monocytes Relative: 6 %
Neutro Abs: 7.2 10*3/uL (ref 1.7–7.7)
Neutrophils Relative %: 58 %
Platelets: 290 10*3/uL (ref 150–400)
RBC: 6.02 MIL/uL — ABNORMAL HIGH (ref 4.22–5.81)
RDW: 13.9 % (ref 11.5–15.5)
WBC: 12.7 10*3/uL — ABNORMAL HIGH (ref 4.0–10.5)
nRBC: 0 % (ref 0.0–0.2)

## 2020-09-24 LAB — HEPATIC FUNCTION PANEL
ALT: 47 U/L — ABNORMAL HIGH (ref 0–44)
AST: 32 U/L (ref 15–41)
Albumin: 4 g/dL (ref 3.5–5.0)
Alkaline Phosphatase: 76 U/L (ref 38–126)
Bilirubin, Direct: 0.1 mg/dL (ref 0.0–0.2)
Indirect Bilirubin: 0.8 mg/dL (ref 0.3–0.9)
Total Bilirubin: 0.9 mg/dL (ref 0.3–1.2)
Total Protein: 8.2 g/dL — ABNORMAL HIGH (ref 6.5–8.1)

## 2020-09-24 LAB — BASIC METABOLIC PANEL
Anion gap: 11 (ref 5–15)
BUN: 16 mg/dL (ref 6–20)
CO2: 22 mmol/L (ref 22–32)
Calcium: 9.3 mg/dL (ref 8.9–10.3)
Chloride: 102 mmol/L (ref 98–111)
Creatinine, Ser: 1.16 mg/dL (ref 0.61–1.24)
GFR, Estimated: >60 mL/min (ref >60)
Glucose, Bld: 149 mg/dL — ABNORMAL HIGH (ref 70–99)
Potassium: 3.6 mmol/L (ref 3.5–5.1)
Sodium: 135 mmol/L (ref 135–145)

## 2020-09-24 LAB — C DIFFICILE QUICK SCREEN W PCR REFLEX
C Diff antigen: NEGATIVE
C Diff interpretation: NOT DETECTED
C Diff toxin: NEGATIVE

## 2020-09-24 LAB — RESP PANEL BY RT-PCR (FLU A&B, COVID) ARPGX2
Influenza A by PCR: NEGATIVE
Influenza B by PCR: NEGATIVE
SARS Coronavirus 2 by RT PCR: NEGATIVE

## 2020-09-24 LAB — MAGNESIUM: Magnesium: 1.7 mg/dL (ref 1.7–2.4)

## 2020-09-24 MED ORDER — SODIUM CHLORIDE 0.9 % IV BOLUS
500.0000 mL | Freq: Once | INTRAVENOUS | Status: AC
  Administered 2020-09-24: 500 mL via INTRAVENOUS

## 2020-09-24 NOTE — ED Provider Notes (Signed)
[facility] EMERGENCY DEPARTMENT Provider Note   CSN: [id number] Arrival date & time: 09/24/20  0859     History Chief Complaint  Patient presents with   Dizziness   Diarrhea    Bryan English. is a 55 y.o. male.  Patient arrives to the ED with complaints of lightheadedness, diarrhea.  Recently admitted to the hospital for extensive cardiac work-up.  States that he is not having any palpitations.  Feels like his heart rate has been in the 80s mostly.  Does not have any chest pain, shortness of breath or abdominal pain.  Feels like he is having some episodes of weakness and lightheadedness and continues to have diarrhea.  Denies any suspicious food intake or sick contacts.  No new medications.  Sometimes he feels a vibration in his feet.  Denies any weakness or numbness or speech changes or vision changes.  Follows with cardiology next week.  Has not seen his primary care doctor yet.  No recent antibiotics.  The history is provided by the patient.  Dizziness Quality:  Lightheadedness Severity:  Mild Onset quality:  Gradual Duration:  5 days Timing:  Intermittent Progression:  Waxing and waning Chronicity:  Recurrent Relieved by:  Nothing Worsened by:  Nothing Associated symptoms: diarrhea   Associated symptoms: no chest pain, no headaches, no palpitations, no shortness of breath, no vomiting and no weakness       Past Medical History:  Diagnosis Date   Diabetes mellitus without complication ([place])    GERD (gastroesophageal reflux disease)    Hyperlipidemia    Hypertension    NICM (nonischemic cardiomyopathy) ([place]) 10/29/2017   EF 20% by echo, minimal CAD at cath    Patient Active Problem List   Diagnosis Date Noted   Tachycardia 09/20/2020   Tachyarrhythmia 09/20/2020   Ventricular tachycardia, polymorphic ([place])    Hypokalemia    Hypomagnesemia    History of colon polyps 05/08/2020   Hyperlipidemia associated with type 2 diabetes mellitus ([place])  03/27/2020   Psoriasis 03/27/2020   Type 2 diabetes mellitus with hyperglycemia, without long-term current use of insulin ([place]) 06/12/2018   Class 2 severe obesity with serious comorbidity and body mass index (BMI) of 38.0 to 38.9 in adult [facility]) 06/12/2018   Nonischemic cardiomyopathy ([place]) 10/27/2017   Elevated coronary artery calcium score 10/14/2017   Essential hypertension 10/14/2017    Past Surgical History:  Procedure Laterality Date   HAND SURGERY     LEFT HEART CATH AND CORONARY ANGIOGRAPHY N/A 10/29/2017   Procedure: LEFT HEART CATH AND CORONARY ANGIOGRAPHY;  Surgeon: [name], MD;  Location: [place] CV LAB;  Service: Cardiovascular;  Laterality: N/A;       Family History  Adopted: Yes  Problem Relation Age of Onset   Diabetes Mother    Colon cancer Neg Hx    Colon polyps Neg Hx    Esophageal cancer Neg Hx    Rectal cancer Neg Hx    Stomach cancer Neg Hx     Social History   Tobacco Use   Smoking status: Never   Smokeless tobacco: Never  Vaping Use   Vaping Use: Never used  Substance Use Topics   Alcohol use: Not Currently   Drug use: Never    Home Medications Prior to Admission medications   Medication Sig Start Date End Date Taking? Authorizing Provider  Blood Glucose Monitoring Suppl (TRUE METRIX AIR GLUCOSE METER) w/Device KIT 1 kit by Does not apply route 2 (two) times  daily. 12/02/17   [name], [name], MD  clobetasol cream (TEMOVATE) 5.05 % Apply 1 application topically 2 (two) times daily. Patient taking differently: Apply 1 application topically 2 (two) times daily as needed (for swelling, itching, or redness of affected areas). 03/27/20   Haydee Salter, MD  fluticasone (FLONASE) 50 MCG/ACT nasal spray Place 1 spray into both nostrils 2 (two) times daily. Reduce to once a day, after 1 month. Patient taking differently: Place 1 spray into both nostrils 2 (two) times daily as needed for allergies or rhinitis. 09/05/20 10/05/20  Haydee Salter, MD   glucose blood (TRUE METRIX BLOOD GLUCOSE TEST) test strip Use as instructed to check blood sugars twice daily and as needed 05/16/20   Haydee Salter, MD  metFORMIN (GLUCOPHAGE) 1000 MG tablet TAKE 1 TABLET (1,000 MG TOTAL) BY MOUTH EVERY EVENING. Patient taking differently: Take 1,000 mg by mouth at bedtime. 09/05/20 09/05/21  Haydee Salter, MD  metoprolol (TOPROL-XL) 200 MG 24 hr tablet TAKE 1 TABLET (200 MG TOTAL) BY MOUTH DAILY TAKE WITH OR IMMEDIATELY FOLLOWING A MEAL. Patient taking differently: Take 200 mg by mouth daily after breakfast. 05/03/20 05/03/21  [name], MD  pantoprazole (PROTONIX) 40 MG tablet Take 1 tablet (40 mg total) by mouth daily. Patient taking differently: Take 40 mg by mouth daily before breakfast. 03/27/20   Haydee Salter, MD  potassium chloride SA (KLOR-CON) 20 MEQ tablet Take 1 tablet (20 mEq total) by mouth daily. 09/21/20   Cheryln Manly, NP  rosuvastatin (CRESTOR) 20 MG tablet Take 1 tablet (20 mg total) by mouth daily. 09/22/20   Cheryln Manly, NP  sacubitril-valsartan (ENTRESTO) 97-103 MG TAKE 1 TABLET BY MOUTH 2 (TWO) TIMES DAILY. Patient taking differently: Take 1 tablet by mouth in the morning and at bedtime. 04/03/20 04/03/21  [name], MD  TRUEPLUS LANCETS 28G MISC Use twice daily to check blood sugars and as needed 12/02/17   [name], [name], MD  zolpidem (AMBIEN) 5 MG tablet Take 1 tablet (5 mg total) by mouth at bedtime as needed for sleep. 09/05/20   Haydee Salter, MD    Allergies    Jardiance [empagliflozin]  Review of Systems   Review of Systems  Constitutional:  Negative for chills and fever.  HENT:  Negative for ear pain and sore throat.   Eyes:  Negative for pain and visual disturbance.  Respiratory:  Negative for cough and shortness of breath.   Cardiovascular:  Negative for chest pain and palpitations.  Gastrointestinal:  Positive for diarrhea. Negative for abdominal pain and vomiting.  Genitourinary:  Negative for dysuria  and hematuria.  Musculoskeletal:  Negative for arthralgias and back pain.  Skin:  Negative for color change and rash.  Neurological:  Positive for light-headedness. Negative for tremors, seizures, syncope, facial asymmetry, speech difficulty, weakness, numbness and headaches.  All other systems reviewed and are negative.  Physical Exam Updated Vital Signs BP (!) 144/89 (BP Location: Right Arm)   Pulse 82   Temp 97.8 F (36.6 C) (Oral)   Resp 16   Ht 5' 8.75" (1.746 m)   Wt 116.5 kg   SpO2 93%   BMI 38.21 kg/m   Physical Exam Vitals and nursing note reviewed.  Constitutional:      General: He is not in acute distress.    Appearance: He is well-developed. He is not ill-appearing.  HENT:     Head: Normocephalic and atraumatic.     Mouth/Throat:  Mouth: Mucous membranes are moist.  Eyes:     Extraocular Movements: Extraocular movements intact.     Conjunctiva/sclera: Conjunctivae normal.  Cardiovascular:     Rate and Rhythm: Normal rate and regular rhythm.     Pulses: Normal pulses.     Heart sounds: Normal heart sounds. No murmur heard. Pulmonary:     Effort: Pulmonary effort is normal. No respiratory distress.     Breath sounds: Normal breath sounds.  Abdominal:     General: Abdomen is flat. There is no distension.     Palpations: Abdomen is soft.     Tenderness: There is no abdominal tenderness. There is no guarding.     Hernia: No hernia is present.  Musculoskeletal:     Cervical back: Normal range of motion and neck supple.  Skin:    General: Skin is warm and dry.  Neurological:     General: No focal deficit present.     Mental Status: He is alert and oriented to person, place, and time.     Cranial Nerves: No cranial nerve deficit.     Sensory: No sensory deficit.     Motor: No weakness.     Coordination: Coordination normal.     Comments: 5+ out of 5 strength throughout, normal sensation, no drift, normal finger-to-nose finger, normal visual fields   Psychiatric:        Mood and Affect: Mood normal.    ED Results / Procedures / Treatments   Labs (all labs ordered are listed, but only abnormal results are displayed) Labs Reviewed  CBC WITH DIFFERENTIAL/PLATELET - Abnormal; Notable for the following components:      Result Value   WBC 12.7 (*)    RBC 6.02 (*)    MCV 78.1 (*)    MCH 25.7 (*)    Lymphs Abs 4.1 (*)    Eosinophils Absolute 0.6 (*)    All other components within normal limits  BASIC METABOLIC PANEL - Abnormal; Notable for the following components:   Glucose, Bld 149 (*)    All other components within normal limits  HEPATIC FUNCTION PANEL - Abnormal; Notable for the following components:   Total Protein 8.2 (*)    ALT 47 (*)    All other components within normal limits  RESP PANEL BY RT-PCR (FLU A&B, COVID) [id number]  GASTROINTESTINAL PANEL BY PCR, STOOL (REPLACES STOOL CULTURE)  C DIFFICILE QUICK SCREEN W PCR REFLEX    MAGNESIUM    EKG EKG Interpretation  Date/Time:  Sunday September 24 2020 09:21:29 EDT Ventricular Rate:  83 PR Interval:  183 QRS Duration: 100 QT Interval:  389 QTC Calculation: 458 R Axis:   -24 Text Interpretation: Sinus rhythm Low voltage, extremity and precordial leads Confirmed by Lennice Sites ([id number]) on 09/24/2020 9:26:09 AM  Radiology DG Chest Portable 1 View  Result Date: 09/24/2020 CLINICAL DATA:  [place] EXAM: PORTABLE CHEST 1 VIEW COMPARISON:  September 20, 2020 FINDINGS: The heart size and mediastinal contours are within normal limits. Both lungs are clear. The visualized skeletal structures are unremarkable. IMPRESSION: No active disease. Electronically Signed   By: Abelardo Diesel M.D.   On: 09/24/2020 09:45    Procedures Procedures   Medications Ordered in ED Medications  sodium chloride 0.9 % bolus 500 mL (0 mLs Intravenous Stopped 09/24/20 1033)    ED Course  I have reviewed the triage vital signs and the nursing notes.  Pertinent labs & imaging results that were  available during my care of the  patient were reviewed by me and considered in my medical decision making (see chart for details).    MDM Rules/Calculators/A&P                           Herbie Baltimore Donnetta Hail. is here with lightheadedness, diarrhea.  Normal vitals.  No fever.  Recent hospital admission for heart irregularity.  Was found to have likely SVT.  Did not appear to have any ventricular arrhythmias while in the hospital.  Overall had unremarkable work-up and had aggressive repletion of potassium and magnesium.  He recently wore a cardiac monitor back in June that did not show any signs of ventricular arrhythmias or A. fib.  He is on a blood thinner.  He states he is not having any palpitations.  He states that his heart rate has been normal since hospital discharge.  Follows up with cardiology next week.  He is mostly concerned about his ongoing diarrhea.  He denies any recent antibiotics.  No obvious suspicious food intake.  No sick contacts.  Denies any abdominal pain or shortness of breath.  Has no abdominal tenderness.  Neurologically he is intact.  No concern for stroke.  He does state that he has some vibration feeling in his feet sometimes but has normal sensation on exam.  Suspect may be some diabetic neuropathy.  Suspect diarrhea likely from a viral process or foodborne process.  Could be medication related.  We will give some hydration and check basic labs and try to send off stool samples if possible.  EKG shows sinus rhythm.  Not having any chest pain or shortness of breath.  No concern for PE or ACS.  No concern for stroke.  Will evaluate for electrolyte abnormality/dehydration.  Lab work shows no significant anemia, electrolyte abnormality, kidney injury.  Stool sample has been sent.  No tenderness on abdominal exam.  Suspect lightheadedness from mild dehydration.  COVID test negative.  Chest x-ray without signs of infection.  Overall appears well.  Suspect that symptoms will  improve once diarrhea improves.  Recommend close follow-up with primary care doctor.  We will follow-up stool studies.  Discharged in good condition.  This chart was dictated using voice recognition software.  Despite best efforts to proofread,  errors can occur which can change the documentation meaning.   Final Clinical Impression(s) / ED Diagnoses Final diagnoses:  Dehydration  Diarrhea, unspecified type  Lightheadedness    Rx / DC Orders ED Discharge Orders     None        Lennice Sites, DO 09/24/20 1111

## 2020-09-24 NOTE — ED Notes (Signed)
Patient reports diarrhea since last night, feeling lightheaded "like I do when I'm dehydrated", patient was recently admitted to [facility].

## 2020-09-24 NOTE — ED Triage Notes (Signed)
Pt c/o continues to have lightheadedness onset 09/19/2020. Pt reports watery stools for past couple days. Pt recent exposure to others with illness.

## 2020-09-26 ENCOUNTER — Other Ambulatory Visit: Payer: Self-pay

## 2020-09-26 ENCOUNTER — Inpatient Hospital Stay: Payer: 59 | Admitting: Family Medicine

## 2020-09-26 ENCOUNTER — Telehealth (HOSPITAL_BASED_OUTPATIENT_CLINIC_OR_DEPARTMENT_OTHER): Payer: Self-pay

## 2020-09-26 LAB — GASTROINTESTINAL PANEL BY PCR, STOOL (REPLACES STOOL CULTURE)

## 2020-09-26 NOTE — Telephone Encounter (Signed)
This RN made aware of pt positive stool sample for Ecoli, spoke with [place] ED MD who advises to call patient for follow up. Pt reports he continues to have 2 loose stools per day, denies any bloody stool, denies any fever. Pt states he has a follow up with his PCP tomorrow. ED MD advises to keep follow up with PCP but return to ED prior to apt if he has any of the symptoms listed above. Pt agrees.

## 2020-09-27 ENCOUNTER — Ambulatory Visit (INDEPENDENT_AMBULATORY_CARE_PROVIDER_SITE_OTHER): Payer: 59 | Admitting: Family Medicine

## 2020-09-27 ENCOUNTER — Encounter: Payer: Self-pay | Admitting: Family Medicine

## 2020-09-27 VITALS — BP 110/80 | HR 67 | Temp 96.5°F | Ht 68.0 in | Wt 247.6 lb

## 2020-09-27 DIAGNOSIS — I428 Other cardiomyopathies: Secondary | ICD-10-CM | POA: Diagnosis not present

## 2020-09-27 DIAGNOSIS — A040 Enteropathogenic Escherichia coli infection: Secondary | ICD-10-CM

## 2020-09-27 DIAGNOSIS — E876 Hypokalemia: Secondary | ICD-10-CM | POA: Diagnosis not present

## 2020-09-27 LAB — BASIC METABOLIC PANEL
BUN: 15 mg/dL (ref 6–23)
CO2: 20 mEq/L (ref 19–32)
Calcium: 9.7 mg/dL (ref 8.4–10.5)
Chloride: 101 mEq/L (ref 96–112)
Creatinine, Ser: 1.06 mg/dL (ref 0.40–1.50)
GFR: 78.51 mL/min (ref >60.00)
Glucose, Bld: 140 mg/dL — ABNORMAL HIGH (ref 70–99)
Potassium: 4.2 mEq/L (ref 3.5–5.1)
Sodium: 134 mEq/L — ABNORMAL LOW (ref 135–145)

## 2020-09-27 LAB — IRON,TIBC AND FERRITIN PANEL
%SAT: 31 % (calc) (ref 20–48)
Ferritin: 248 ng/mL (ref 38–380)
Iron: 87 ug/dL (ref 50–180)
TIBC: 277 mcg/dL (calc) (ref 250–425)

## 2020-09-27 LAB — MAGNESIUM: Magnesium: 1.6 mg/dL (ref 1.5–2.5)

## 2020-09-27 MED ORDER — AZITHROMYCIN 500 MG PO TABS: 500.0000 mg | ORAL_TABLET | Freq: Every day | ORAL | 0 refills | Status: DC

## 2020-09-27 NOTE — Progress Notes (Signed)
[facility] [address] [place] [address] Dept: [phone] Dept Fax: [phone]  Office Visit  Subjective:    Patient ID: Bryan Greet., male    DOB: Dec 28, 1965, 55 y.o..   MRN: [id number]  Chief Complaint  Patient presents with   Follow-up    Hosp f/u    History of Present Illness:  Patient is in today for hospital follow-up. [name] was admitted to [facility] on 9/7 with tachycardia, hypokalemia, and hypomagnesemia. Prior to this admission he had been having watery diarrhea for 1-2 weeks. At admission he was found to have a potassium of 2.8, magnesium of 1.6, and a WBS of 14.2 with mild lymphocytosis. He was tachycardic with heart rates up to the 170s and hypertensive with BP of 170/141. He was managed with oxygen, fluids, IV magnesium and IV potassium. His symptoms improved with this regimen and he was discharged home. Since being home, he has continued to have some watery diarrhea. He also has noted some abdominal cramping at times. he denies seeing any blood in his stool. He is eating and drinking fluids okay.  [name] notes he was contacted yesterday to inform him his stool studies were positive for E. coli. He recalls having eaten at a [place] just prior to developing his diarrhea, so feels this may be where he contracted the infection.  Past Medical History: Patient Active Problem List   Diagnosis Date Noted   Tachycardia 09/20/2020   Tachyarrhythmia 09/20/2020   Ventricular tachycardia, polymorphic ([facility])    Hypokalemia    Hypomagnesemia    History of colon polyps 05/08/2020   Hyperlipidemia associated with type 2 diabetes mellitus ([place]) 03/27/2020   Psoriasis 03/27/2020   Type 2 diabetes mellitus with hyperglycemia, without long-term current use of insulin ([place]) 06/12/2018   Class 2 severe obesity with serious comorbidity and body mass index (BMI) of 38.0 to 38.9 in adult [facility])  06/12/2018   Nonischemic cardiomyopathy ([place]) 10/27/2017   Elevated coronary artery calcium score 10/14/2017   Essential hypertension 10/14/2017   Past Surgical History:  Procedure Laterality Date   HAND SURGERY     LEFT HEART CATH AND CORONARY ANGIOGRAPHY N/A 10/29/2017   Procedure: LEFT HEART CATH AND CORONARY ANGIOGRAPHY;  Surgeon: [name], MD;  Location: [facility] LAB;  Service: Cardiovascular;  Laterality: N/A;   Family History  Adopted: Yes  Problem Relation Age of Onset   Diabetes Mother    Colon cancer Neg Hx    Colon polyps Neg Hx    Esophageal cancer Neg Hx    Rectal cancer Neg Hx    Stomach cancer Neg Hx    Outpatient Medications Prior to Visit  Medication Sig Dispense Refill   Blood Glucose Monitoring Suppl (TRUE METRIX AIR GLUCOSE METER) w/Device KIT 1 kit by Does not apply route 2 (two) times daily. 1 kit 0   clobetasol cream (TEMOVATE) 7.34 % Apply 1 application topically 2 (two) times daily. (Patient taking differently: Apply 1 application topically 2 (two) times daily as needed (for swelling, itching, or redness of affected areas).) 60 g prn   fluticasone (FLONASE) 50 MCG/ACT nasal spray Place 1 spray into both nostrils 2 (two) times daily. Reduce to once a day, after 1 month. (Patient taking differently: Place 1 spray into both nostrils 2 (two) times daily as needed for allergies or rhinitis.) 16 g 6   glucose blood (TRUE METRIX BLOOD GLUCOSE TEST) test strip Use as instructed  to check blood sugars twice daily and as needed 100 each 3   metFORMIN (GLUCOPHAGE) 1000 MG tablet TAKE 1 TABLET (1,000 MG TOTAL) BY MOUTH EVERY EVENING. (Patient taking differently: Take 1,000 mg by mouth at bedtime.) 90 tablet 1   metoprolol (TOPROL-XL) 200 MG 24 hr tablet TAKE 1 TABLET (200 MG TOTAL) BY MOUTH DAILY TAKE WITH OR IMMEDIATELY FOLLOWING A MEAL. (Patient taking differently: Take 200 mg by mouth daily after breakfast.) 30 tablet 9   pantoprazole (PROTONIX) 40 MG tablet  Take 1 tablet (40 mg total) by mouth daily. (Patient taking differently: Take 40 mg by mouth daily before breakfast.) 90 tablet 4   potassium chloride SA (KLOR-CON) 20 MEQ tablet Take 1 tablet (20 mEq total) by mouth daily. 30 tablet 2   rosuvastatin (CRESTOR) 20 MG tablet Take 1 tablet (20 mg total) by mouth daily. 90 tablet 0   sacubitril-valsartan (ENTRESTO) 97-103 MG TAKE 1 TABLET BY MOUTH 2 (TWO) TIMES DAILY. (Patient taking differently: Take 1 tablet by mouth in the morning and at bedtime.) 60 tablet 6   TRUEPLUS LANCETS 28G MISC Use twice daily to check blood sugars and as needed 100 each 3   zolpidem (AMBIEN) 5 MG tablet Take 1 tablet (5 mg total) by mouth at bedtime as needed for sleep. 15 tablet 1   No facility-administered medications prior to visit.   Allergies  Allergen Reactions   Jardiance [Empagliflozin] Other (See Comments)    Vertigo     Objective:   Today's Vitals   09/27/20 1132  BP: 110/80  Pulse: 67  Temp: (!) 96.5 F (35.8 C)  TempSrc: Temporal  SpO2: 97%  Weight: 247 lb 9.6 oz (112.3 kg)  Height: $Remove'5\' 8"'[id number]$  (1.727 m)   Body mass index is 37.65 kg/m.   General: Well developed, well nourished. No acute distress. Abdomen: Soft, non-tender. No rebound or guarding. Psych: Alert and oriented. Normal mood and affect.  Health Maintenance Due  Topic Date Due   PNEUMOCOCCAL POLYSACCHARIDE VACCINE AGE 34-64 HIGH RISK  Never done   Pneumococcal Vaccine 85-66 Years old (1 - PCV) Never done   FOOT EXAM  Never done   OPHTHALMOLOGY EXAM  Never done   Hepatitis C Screening  Never done   TETANUS/TDAP  Never done   Zoster Vaccines- Shingrix (1 of 2) Never done   COVID-19 Vaccine (3 - Pfizer risk series) 12/15/2019   INFLUENZA VACCINE  Never done   Lab Results Component Ref Range & Units 3 d ago   Campylobacter species NOT DETECTED NOT DETECTED   Plesimonas shigelloides NOT DETECTED NOT DETECTED   Salmonella species NOT DETECTED NOT DETECTED   Yersinia enterocolitica  NOT DETECTED NOT DETECTED   Vibrio species NOT DETECTED NOT DETECTED   Vibrio cholerae NOT DETECTED NOT DETECTED   Enteroaggregative E coli (EAEC) NOT DETECTED NOT DETECTED   Enteropathogenic E coli (EPEC) NOT DETECTED DETECTED Abnormal    Comment: RESULT CALLED TO, READ BACK BY AND VERIFIED WITH:  JAIME BAILEY 09/26/20 1122 KLW   Enterotoxigenic E coli (ETEC) NOT DETECTED NOT DETECTED   Shiga like toxin producing E coli (STEC) NOT DETECTED NOT DETECTED   Shigella/Enteroinvasive E coli (EIEC) NOT DETECTED NOT DETECTED   Cryptosporidium NOT DETECTED NOT DETECTED   Cyclospora cayetanensis NOT DETECTED NOT DETECTED   Entamoeba histolytica NOT DETECTED NOT DETECTED   Giardia lamblia NOT DETECTED NOT DETECTED   Adenovirus F40/41 NOT DETECTED NOT DETECTED   Astrovirus NOT DETECTED NOT DETECTED   Norovirus  GI/GII NOT DETECTED NOT DETECTED   Rotavirus A NOT DETECTED NOT DETECTED   Sapovirus (I, II, IV, and V) NOT DETECTED NOT DETECTED       Assessment & Plan:   1. Enteritis, enteropathogenic E. coli In light of the severity of Mr. Alcario Drought symptoms and his prolonged diarrhea, I will go ahead and treat him with antibiotics for this.  I will also do lab follow-up for his potassium and magnesium imbalance. He also raised concerns about his iron level, which we will include. I will plan to see him back in about a month.  - azithromycin (ZITHROMAX) 500 MG tablet; Take 1 tablet (500 mg total) by mouth daily.  Dispense: 3 tablet; Refill: 0 - Basic metabolic panel - Iron, TIBC and Ferritin Panel - Magnesium  2. Hypokalemia As above. - Basic metabolic panel  3. Hypomagnesemia As above. - Magnesium  4. Nonischemic cardiomyopathy [facility] Cardiology had suggested reconsidering Jardiance. [name] had symptomatic orthostatic hypotension when he was on this, so had stopped the medication. He is unsure if these symptoms may have been due to dehydration from his enteritis, so would consider possibly  trying this medication again. I recommend he stabilize from the E. coli infection and we can readdress this at his next visit.  Haydee Salter, MD

## 2020-09-28 ENCOUNTER — Inpatient Hospital Stay: Payer: 59 | Admitting: Family Medicine

## 2020-10-03 ENCOUNTER — Ambulatory Visit (INDEPENDENT_AMBULATORY_CARE_PROVIDER_SITE_OTHER): Payer: 59 | Admitting: Physician Assistant

## 2020-10-03 ENCOUNTER — Emergency Department (HOSPITAL_BASED_OUTPATIENT_CLINIC_OR_DEPARTMENT_OTHER): Payer: 59

## 2020-10-03 ENCOUNTER — Emergency Department (HOSPITAL_BASED_OUTPATIENT_CLINIC_OR_DEPARTMENT_OTHER)
Admission: EM | Admit: 2020-10-03 | Discharge: 2020-10-03 | Disposition: A | Discharge status: Home / Self Care | Payer: 59 | Attending: Emergency Medicine | Admitting: Emergency Medicine

## 2020-10-03 ENCOUNTER — Encounter (HOSPITAL_BASED_OUTPATIENT_CLINIC_OR_DEPARTMENT_OTHER): Payer: Self-pay | Admitting: *Deleted

## 2020-10-03 ENCOUNTER — Encounter: Payer: Self-pay | Admitting: Physician Assistant

## 2020-10-03 ENCOUNTER — Other Ambulatory Visit: Payer: Self-pay

## 2020-10-03 ENCOUNTER — Telehealth: Payer: Self-pay | Admitting: Family Medicine

## 2020-10-03 VITALS — BP 140/82 | HR 84 | Ht 68.75 in | Wt 251.4 lb

## 2020-10-03 DIAGNOSIS — I10 Essential (primary) hypertension: Secondary | ICD-10-CM | POA: Diagnosis not present

## 2020-10-03 DIAGNOSIS — I428 Other cardiomyopathies: Secondary | ICD-10-CM

## 2020-10-03 DIAGNOSIS — E119 Type 2 diabetes mellitus without complications: Secondary | ICD-10-CM | POA: Diagnosis not present

## 2020-10-03 DIAGNOSIS — R140 Abdominal distension (gaseous): Secondary | ICD-10-CM | POA: Diagnosis not present

## 2020-10-03 DIAGNOSIS — I2510 Atherosclerotic heart disease of native coronary artery without angina pectoris: Secondary | ICD-10-CM | POA: Diagnosis not present

## 2020-10-03 DIAGNOSIS — E876 Hypokalemia: Secondary | ICD-10-CM

## 2020-10-03 DIAGNOSIS — Z7984 Long term (current) use of oral hypoglycemic drugs: Secondary | ICD-10-CM | POA: Diagnosis not present

## 2020-10-03 DIAGNOSIS — E785 Hyperlipidemia, unspecified: Secondary | ICD-10-CM

## 2020-10-03 LAB — CBC WITH DIFFERENTIAL/PLATELET
Abs Immature Granulocytes: 0.05 10*3/uL (ref 0.00–0.07)
Basophils Absolute: 0.1 10*3/uL (ref 0.0–0.1)
Basophils Relative: 1 %
Eosinophils Absolute: 0.6 10*3/uL — ABNORMAL HIGH (ref 0.0–0.5)
Eosinophils Relative: 4 %
HCT: 46 % (ref 39.0–52.0)
Hemoglobin: 15.1 g/dL (ref 13.0–17.0)
Immature Granulocytes: 0 %
Lymphocytes Relative: 32 %
Lymphs Abs: 5 10*3/uL — ABNORMAL HIGH (ref 0.7–4.0)
MCH: 25.4 pg — ABNORMAL LOW (ref 26.0–34.0)
MCHC: 32.8 g/dL (ref 30.0–36.0)
MCV: 77.4 fL — ABNORMAL LOW (ref 80.0–100.0)
Monocytes Absolute: 1.1 10*3/uL — ABNORMAL HIGH (ref 0.1–1.0)
Monocytes Relative: 7 %
Neutro Abs: 8.6 10*3/uL — ABNORMAL HIGH (ref 1.7–7.7)
Neutrophils Relative %: 56 %
Platelets: 333 10*3/uL (ref 150–400)
RBC: 5.94 MIL/uL — ABNORMAL HIGH (ref 4.22–5.81)
RDW: 14 % (ref 11.5–15.5)
WBC: 15.3 10*3/uL — ABNORMAL HIGH (ref 4.0–10.5)
nRBC: 0 % (ref 0.0–0.2)

## 2020-10-03 LAB — BASIC METABOLIC PANEL
Anion gap: 11 (ref 5–15)
BUN: 13 mg/dL (ref 6–20)
CO2: 22 mmol/L (ref 22–32)
Calcium: 9.2 mg/dL (ref 8.9–10.3)
Chloride: 103 mmol/L (ref 98–111)
Creatinine, Ser: 1.06 mg/dL (ref 0.61–1.24)
GFR, Estimated: >60 mL/min (ref >60)
Glucose, Bld: 164 mg/dL — ABNORMAL HIGH (ref 70–99)
Potassium: 3.5 mmol/L (ref 3.5–5.1)
Sodium: 136 mmol/L (ref 135–145)

## 2020-10-03 LAB — URINALYSIS, ROUTINE W REFLEX MICROSCOPIC
Bilirubin Urine: NEGATIVE
Glucose, UA: NEGATIVE mg/dL
Ketones, ur: NEGATIVE mg/dL
Leukocytes,Ua: NEGATIVE
Nitrite: NEGATIVE
Protein, ur: NEGATIVE mg/dL
Specific Gravity, Urine: 1.015 (ref 1.005–1.030)
pH: 6 (ref 5.0–8.0)

## 2020-10-03 LAB — URINALYSIS, MICROSCOPIC (REFLEX)

## 2020-10-03 LAB — TROPONIN I (HIGH SENSITIVITY)
Troponin I (High Sensitivity): 4 ng/L (ref <18)
Troponin I (High Sensitivity): 5 ng/L (ref <18)

## 2020-10-03 MED ORDER — ALUM & MAG HYDROXIDE-SIMETH 200-200-20 MG/5ML PO SUSP
30.0000 mL | Freq: Once | ORAL | Status: AC
  Administered 2020-10-03: 30 mL via ORAL
  Filled 2020-10-03: qty 30

## 2020-10-03 NOTE — ED Triage Notes (Signed)
Substernal CP tonight. Reports that after belching it subsided.  Now reports HTN.  Denies SOB, denies CP, denies N/V at this time.

## 2020-10-03 NOTE — Telephone Encounter (Signed)
Lft VM to rtn call. Dm/cma  

## 2020-10-03 NOTE — Progress Notes (Signed)
Cardiology Office Note:    Date:  10/05/2020   ID:  Bryan English., DOB Nov 05, 1965, MRN [id number]  PCP:  Haydee Salter, MD   [facility] Providers Cardiologist:  [name], MD     Referring MD: Haydee Salter, MD   Chief Complaint  Patient presents with   Follow-up    Seen for Dr. Percival Spanish    History of Present Illness:    Bryan English. is a 55 y.o. male with a hx of nonobstructive CAD on cath 2019, history of nonischemic cardiomyopathy with improved EF, HTN, HLD, DM II and GERD.  Echocardiogram in October 2019 showed EF 20 to 25%, diffuse hypokinesis, grade 2 DD, moderate to severely dilated left atrium, mild MR and trivial TR.  He underwent cardiac catheterization on 10/29/2017 which showed no significant CAD, smooth 25 to 30% proximal RCA narrowing, normal left coronary circulation, LVEDP 17 mmHg.  Etiology for cardiomyopathy was felt to be nonischemic in nature.  He was placed on metoprolol XL 100 mg daily and lisinopril.  Repeat echocardiogram in January 2021 showed EF has improved to 50 to 55%, grade 1 DD, no significant valvular disease.  He was later was switched from lisinopril to [facility].  Echocardiogram in March 2022 showed stable LV function, grade 1 DD.  He was admitted to the hospital in April 2022 for chest pain.  Serial troponin was flat and not consistent with ACS, no further ischemic work-up was recommended.  He had some frequent PVCs and no significant arrhythmia noted on telemetry, event monitor was recommended to assess the PVC burden.  Event monitor revealed episode of NSVT and bradycardia however in not sustained.  No change in medical therapy was recommended.  More recently, he was admitted to the hospital on 09/20/2020 after presenting to [facility] with complaint of irregular heartbeat.  While in the ER, his heart rate was noted to 200 bpm.  Initial lab work also had concerning hypokalemia with potassium of 2.8,  hypomagnesia of 1.6.  High-sensitivity troponin was initially 5 however went up to 61.  COVID and flu were negative.  Chest x-ray showed no acute finding.  EKG demonstrated narrow complex tachycardia with concern polymorphic VT versus artifact.  He was also hypertensive with a blood pressure 170/141.  He was given 2 g of mag and potassium and transferred to [facility].  Repeat EKG demonstrated sinus tachycardia with heart rate in the 130s, prolonged QTC of 596 ms.  This was in the setting of severe hypokalemia and hypomagnesemia.  Both were repleted during the hospitalization.  No further arrhythmia was noted on the heart monitor and the QTC returned back to normal.  He was discharged on potassium supplement.  Since the recent discharge, he presented to the ED on 09/24/2020 with dizziness and diarrhea.  EKG shows sinus rhythm without any recurrence of the irregular rhythm.  COVID test remain negative.  It was suspected that his symptom was related to diarrhea and recommended hydration.  He presented to the ED early this morning again with chest pain that improved with belching and also elevated blood pressure. White blood cell count was 15.3.  Potassium 3.5.  Chest x-ray shows no acute disease.  Patient presents today for follow-up.  He did have another bout of diarrhea this morning.  His chest pain significantly improved after burping.  Given the atypical nature and the negative troponin despite hours of chest discomfort, I do not recommend any further work-up.  White blood cell count is elevated at 15.3, he will need to see his PCP to make sure there is no other sign of infection.  Both neutrophil, lymphocyte, monocyte and eosinophils were elevated.  EKG shows no acute changes.  Chest x-ray was normal.  Given his occasional blood pressure spikes, I recommended addition of as needed dose of hydralazine 25 mg for systolic blood pressure greater than 160.  Otherwise, I do not recommend changing his  scheduled medication as his blood pressure was previously very well controlled.  He can follow-up with Dr. Percival Spanish in 3 to 4 months.  Given the low potassium this morning, I recommended he takes additional dose of potassium supplement for the next 3 days before going back to 1 tablet daily thereafter.  He will need repeat basic metabolic panel in 1 to 2 weeks.   Past Medical History:  Diagnosis Date   Diabetes mellitus without complication (HCC)    GERD (gastroesophageal reflux disease)    Hyperlipidemia    Hypertension    NICM (nonischemic cardiomyopathy) ([place]) 10/29/2017   EF 20% by echo, minimal CAD at cath    Past Surgical History:  Procedure Laterality Date   HAND SURGERY     LEFT HEART CATH AND CORONARY ANGIOGRAPHY N/A 10/29/2017   Procedure: LEFT HEART CATH AND CORONARY ANGIOGRAPHY;  Surgeon: [name], MD;  Location: [facility];  Service: Cardiovascular;  Laterality: N/A;    Current Medications: Current Meds  Medication Sig   azithromycin (ZITHROMAX) 500 MG tablet Take 1 tablet (500 mg total) by mouth daily.   Blood Glucose Monitoring Suppl (TRUE METRIX AIR GLUCOSE METER) w/Device KIT 1 kit by Does not apply route 2 (two) times daily.   clobetasol cream (TEMOVATE) 2.77 % Apply 1 application topically 2 (two) times daily. (Patient taking differently: Apply 1 application topically 2 (two) times daily as needed (for swelling, itching, or redness of affected areas).)   fluticasone (FLONASE) 50 MCG/ACT nasal spray Place 1 spray into both nostrils 2 (two) times daily. Reduce to once a day, after 1 month. (Patient taking differently: Place 1 spray into both nostrils 2 (two) times daily as needed for allergies or rhinitis.)   glucose blood (TRUE METRIX BLOOD GLUCOSE TEST) test strip Use as instructed to check blood sugars twice daily and as needed   metFORMIN (GLUCOPHAGE) 1000 MG tablet TAKE 1 TABLET (1,000 MG TOTAL) BY MOUTH EVERY EVENING. (Patient taking differently: Take  1,000 mg by mouth at bedtime.)   metoprolol (TOPROL-XL) 200 MG 24 hr tablet TAKE 1 TABLET (200 MG TOTAL) BY MOUTH DAILY TAKE WITH OR IMMEDIATELY FOLLOWING A MEAL. (Patient taking differently: Take 200 mg by mouth daily after breakfast.)   pantoprazole (PROTONIX) 40 MG tablet Take 1 tablet (40 mg total) by mouth daily. (Patient taking differently: Take 40 mg by mouth daily before breakfast.)   potassium chloride SA (KLOR-CON) 20 MEQ tablet Take 1 tablet (20 mEq total) by mouth daily.   rosuvastatin (CRESTOR) 20 MG tablet Take 1 tablet (20 mg total) by mouth daily.   sacubitril-valsartan (ENTRESTO) 97-103 MG TAKE 1 TABLET BY MOUTH 2 (TWO) TIMES DAILY. (Patient taking differently: Take 1 tablet by mouth in the morning and at bedtime.)   TRUEPLUS LANCETS 28G MISC Use twice daily to check blood sugars and as needed   zolpidem (AMBIEN) 5 MG tablet Take 1 tablet (5 mg total) by mouth at bedtime as needed for sleep.     Allergies:   Jardiance [empagliflozin]  Social History   Socioeconomic History   Marital status: Married    Spouse name: Not on file   Number of children: Not on file   Years of education: Not on file   Highest education level: Not on file  Occupational History   Not on file  Tobacco Use   Smoking status: Never   Smokeless tobacco: Never  Vaping Use   Vaping Use: Never used  Substance and Sexual Activity   Alcohol use: Not Currently   Drug use: Never   Sexual activity: Not on file  Other Topics Concern   Not on file  Social History Narrative   Married.  Lives with wife.  Two children natural.      Social Determinants of Health   Financial Resource Strain: Not on file  Food Insecurity: Not on file  Transportation Needs: Not on file  Physical Activity: Not on file  Stress: Not on file  Social Connections: Not on file     Family History: The patient's family history includes Diabetes in his mother. There is no history of Colon cancer, Colon polyps, Esophageal  cancer, Rectal cancer, or Stomach cancer. He was adopted.  ROS:   Please see the history of present illness.     All other systems reviewed and are negative.  EKGs/Labs/Other Studies Reviewed:    The following studies were reviewed today:  Echo 03/15/2020 1. Left ventricular ejection fraction, by estimation, is 50 to 55%. Left  ventricular ejection fraction by 3D volume is 55 %. The left ventricle has  low normal function. The left ventricle has no regional wall motion  abnormalities. There is mild left  ventricular hypertrophy. Left ventricular diastolic parameters are  consistent with Grade I diastolic dysfunction (impaired relaxation). The  average left ventricular global longitudinal strain is -20.0 %.   2. Right ventricular systolic function is normal. The right ventricular  size is normal. Tricuspid regurgitation signal is inadequate for assessing  PA pressure.   3. The mitral valve is normal in structure. No evidence of mitral valve  regurgitation.   4. The aortic valve is tricuspid. Aortic valve regurgitation is not  visualized. No aortic stenosis is present.   5. Aortic dilatation noted. There is mild dilatation of the aortic root,  measuring 43 mm. There is mild dilatation of the ascending aorta,  measuring 38 mm.   6. The inferior vena cava is normal in size with greater than 50%  respiratory variability, suggesting right atrial pressure of 3 mmHg.   Comparison(s): 01/25/19 EF 50-55%. GLS -15.9%.   EKG:  EKG is not ordered today.    Recent Labs: 04/27/2020: TSH 2.392 09/24/2020: ALT 47 09/27/2020: Magnesium 1.6 10/03/2020: BUN 13; Creatinine, Ser 1.06; Hemoglobin 15.1; Platelets 333; Potassium 3.5; Sodium 136  Recent Lipid Panel    Component Value Date/Time   CHOL 217 (H) 09/05/2020 1211   CHOL 218 (H) 06/12/2018 1456   TRIG 154.0 (H) 09/05/2020 1211   HDL 29.90 (L) 09/05/2020 1211   HDL 31 (L) 06/12/2018 1456   CHOLHDL 7 09/05/2020 1211   VLDL 30.8 09/05/2020  1211   LDLCALC 156 (H) 09/05/2020 1211   LDLCALC 153 (H) 06/12/2018 1456     Risk Assessment/Calculations:    CHA2DS2-VASc Score = 4   This indicates a 4.8% annual risk of stroke. The patient's score is based upon: CHF History: 1 HTN History: 1 Diabetes History: 1 Stroke History: 0 Vascular Disease History: 1 Age Score: 0 Gender Score: 0  Physical Exam:    VS:  BP 140/82 (BP Location: Left Arm)   Pulse 84   Ht 5' 8.75" (1.746 m)   Wt 251 lb 6.4 oz (114 kg)   SpO2 95%   BMI 37.40 kg/m     Wt Readings from Last 3 Encounters:  10/03/20 251 lb 6.4 oz (114 kg)  10/03/20 250 lb (113.4 kg)  09/27/20 247 lb 9.6 oz (112.3 kg)     GEN:  Well nourished, well developed in no acute distress HEENT: Normal NECK: No JVD; No carotid bruits LYMPHATICS: No lymphadenopathy CARDIAC: RRR, no murmurs, rubs, gallops RESPIRATORY:  Clear to auscultation without rales, wheezing or rhonchi  ABDOMEN: Soft, non-tender, non-distended MUSCULOSKELETAL:  No edema; No deformity  SKIN: Warm and dry NEUROLOGIC:  Alert and oriented x 3 PSYCHIATRIC:  Normal affect   ASSESSMENT:    1. Hypokalemia   2. Coronary artery disease involving native coronary artery of native heart without angina pectoris   3. NICM (nonischemic cardiomyopathy) ([place])   4. Essential hypertension   5. Hyperlipidemia LDL goal <70   6. Controlled type 2 diabetes mellitus without complication, without long-term current use of insulin ([place])   7. PAF (paroxysmal atrial fibrillation) (HCC)    PLAN:    In order of problems listed above:  Hypokalemia: Recently went to the hospital with tachycardia and nonsustained VT in the setting of severe hypokalemia.  I instructed the patient to increase potassium to 40 meq daily for 3 days before resuming 20 meq daily thereafter.  Will obtain basic metabolic panel in 1 to 2 weeks.  CAD: Denies any chest pain  History of nonischemic cardiomyopathy with improved EF: Last  echocardiogram obtained in March 2022 showed EF 50 to 55%.  Continue metoprolol succinate and Entresto  Hypertension: Blood pressure spikes occasionally, will add as needed dose of hydralazine 25 mg for systolic blood pressure greater than 150 mmHg.  Hyperlipidemia: On Crestor  DM2: Managed by primary care provider         Medication Adjustments/Labs and Tests Ordered: Current medicines are reviewed at length with the patient today.  Concerns regarding medicines are outlined above.  Orders Placed This Encounter  Procedures   Basic metabolic panel   No orders of the defined types were placed in this encounter.   Patient Instructions  Medication Instructions:  START Hydralazine 25 mg as needed for Systolic (top number) blood pressure is greater than 160 INCREASE Potassium to 40 meq daily for 3 days, then resume Potassium 20 meq daily   *If you need a refill on your cardiac medications before your next appointment, please call your pharmacy*  Lab Work: Your physician recommends that you return for lab work in 1-2 weeks:  BMET  If you have labs (blood work) drawn today and your tests are completely normal, you will receive your results only by: [employer] (if you have MyChart) OR A paper copy in the mail If you have any lab test that is abnormal or we need to change your treatment, we will call you to review the results.  Testing/Procedures: NONE ordered at this time of appointment   Follow-Up: At [facility], you and your health needs are our priority.  As part of our continuing mission to provide you with exceptional heart care, we have created designated Provider Care Teams.  These Care Teams include your primary Cardiologist (physician) and Advanced Practice Providers (APPs -  Physician Assistants and Nurse Practitioners) who all work together to provide you  with the care you need, when you need it.  Your next appointment:   3-4 month(s)  The format for your  next appointment:   In Person  Provider:   [name], MD  Other Instructions    Signed, Almyra Deforest, [place]  10/05/2020 10:26 PM    [place]

## 2020-10-03 NOTE — Patient Instructions (Addendum)
Medication Instructions:  START Hydralazine 25 mg as needed for Systolic (top number) blood pressure is greater than 160 INCREASE Potassium to 40 meq daily for 3 days, then resume Potassium 20 meq daily   *If you need a refill on your cardiac medications before your next appointment, please call your pharmacy*  Lab Work: Your physician recommends that you return for lab work in 1-2 weeks:  BMET  If you have labs (blood work) drawn today and your tests are completely normal, you will receive your results only by: [employer] (if you have MyChart) OR A paper copy in the mail If you have any lab test that is abnormal or we need to change your treatment, we will call you to review the results.  Testing/Procedures: NONE ordered at this time of appointment   Follow-Up: At [facility], you and your health needs are our priority.  As part of our continuing mission to provide you with exceptional heart care, we have created designated Provider Care Teams.  These Care Teams include your primary Cardiologist (physician) and Advanced Practice Providers (APPs -  Physician Assistants and Nurse Practitioners) who all work together to provide you with the care you need, when you need it.  Your next appointment:   3-4 month(s)  The format for your next appointment:   In Person  Provider:   [name], MD  Other Instructions

## 2020-10-03 NOTE — ED Provider Notes (Signed)
[facility] Provider Note   CSN: [id number] Arrival date & time: 10/03/20  0127     History Chief Complaint  Patient presents with   Hypertension    Kohl Polinsky. is a 55 y.o. male.  The history is provided by the patient.  Hypertension This is a chronic problem. The current episode started more than 1 week ago. The problem occurs constantly. The problem has been gradually worsening. Pertinent negatives include no chest pain, no abdominal pain, no headaches and no shortness of breath. Nothing aggravates the symptoms. Nothing relieves the symptoms. He has tried nothing for the symptoms. The treatment provided significant relief.  Patient with HTN and GERD who presents with elevated BP at home tonight.  Is also feeling gassy.  No DOE, no SOB, no n/v/d.      Past Medical History:  Diagnosis Date   Diabetes mellitus without complication (HCC)    GERD (gastroesophageal reflux disease)    Hyperlipidemia    Hypertension    NICM (nonischemic cardiomyopathy) ([place]) 10/29/2017   EF 20% by echo, minimal CAD at cath    Patient Active Problem List   Diagnosis Date Noted   Tachycardia 09/20/2020   Tachyarrhythmia 09/20/2020   Ventricular tachycardia, polymorphic ([place])    Hypokalemia    Hypomagnesemia    History of colon polyps 05/08/2020   Hyperlipidemia associated with type 2 diabetes mellitus ([place]) 03/27/2020   Psoriasis 03/27/2020   Type 2 diabetes mellitus with hyperglycemia, without long-term current use of insulin ([place]) 06/12/2018   Class 2 severe obesity with serious comorbidity and body mass index (BMI) of 38.0 to 38.9 in adult [facility]) 06/12/2018   Nonischemic cardiomyopathy ([place]) 10/27/2017   Elevated coronary artery calcium score 10/14/2017   Essential hypertension 10/14/2017    Past Surgical History:  Procedure Laterality Date   HAND SURGERY     LEFT HEART CATH AND CORONARY ANGIOGRAPHY N/A 10/29/2017   Procedure: LEFT HEART  CATH AND CORONARY ANGIOGRAPHY;  Surgeon: [name], MD;  Location: [facility];  Service: Cardiovascular;  Laterality: N/A;       Family History  Adopted: Yes  Problem Relation Age of Onset   Diabetes Mother    Colon cancer Neg Hx    Colon polyps Neg Hx    Esophageal cancer Neg Hx    Rectal cancer Neg Hx    Stomach cancer Neg Hx     Social History   Tobacco Use   Smoking status: Never   Smokeless tobacco: Never  Vaping Use   Vaping Use: Never used  Substance Use Topics   Alcohol use: Not Currently   Drug use: Never    Home Medications Prior to Admission medications   Medication Sig Start Date End Date Taking? Authorizing Provider  azithromycin (ZITHROMAX) 500 MG tablet Take 1 tablet (500 mg total) by mouth daily. 09/27/20   Haydee Salter, MD  Blood Glucose Monitoring Suppl (TRUE METRIX AIR GLUCOSE METER) w/Device KIT 1 kit by Does not apply route 2 (two) times daily. 12/02/17   [name], [name], MD  clobetasol cream (TEMOVATE) 0.81 % Apply 1 application topically 2 (two) times daily. Patient taking differently: Apply 1 application topically 2 (two) times daily as needed (for swelling, itching, or redness of affected areas). 03/27/20   Haydee Salter, MD  fluticasone (FLONASE) 50 MCG/ACT nasal spray Place 1 spray into both nostrils 2 (two) times daily. Reduce to once a day, after 1 month. Patient taking differently:  Place 1 spray into both nostrils 2 (two) times daily as needed for allergies or rhinitis. 09/05/20 10/05/20  Haydee Salter, MD  glucose blood (TRUE METRIX BLOOD GLUCOSE TEST) test strip Use as instructed to check blood sugars twice daily and as needed 05/16/20   Haydee Salter, MD  metFORMIN (GLUCOPHAGE) 1000 MG tablet TAKE 1 TABLET (1,000 MG TOTAL) BY MOUTH EVERY EVENING. Patient taking differently: Take 1,000 mg by mouth at bedtime. 09/05/20 09/05/21  Haydee Salter, MD  metoprolol (TOPROL-XL) 200 MG 24 hr tablet TAKE 1 TABLET (200 MG TOTAL) BY MOUTH DAILY  TAKE WITH OR IMMEDIATELY FOLLOWING A MEAL. Patient taking differently: Take 200 mg by mouth daily after breakfast. 05/03/20 05/03/21  [name], MD  pantoprazole (PROTONIX) 40 MG tablet Take 1 tablet (40 mg total) by mouth daily. Patient taking differently: Take 40 mg by mouth daily before breakfast. 03/27/20   Haydee Salter, MD  potassium chloride SA (KLOR-CON) 20 MEQ tablet Take 1 tablet (20 mEq total) by mouth daily. 09/21/20   Cheryln Manly, NP  rosuvastatin (CRESTOR) 20 MG tablet Take 1 tablet (20 mg total) by mouth daily. 09/22/20   Cheryln Manly, NP  sacubitril-valsartan (ENTRESTO) 97-103 MG TAKE 1 TABLET BY MOUTH 2 (TWO) TIMES DAILY. Patient taking differently: Take 1 tablet by mouth in the morning and at bedtime. 04/03/20 04/03/21  [name], MD  TRUEPLUS LANCETS 28G MISC Use twice daily to check blood sugars and as needed 12/02/17   [name], [name], MD  zolpidem (AMBIEN) 5 MG tablet Take 1 tablet (5 mg total) by mouth at bedtime as needed for sleep. 09/05/20   Haydee Salter, MD    Allergies    Jardiance [empagliflozin]  Review of Systems   Review of Systems  Constitutional:  Negative for fever.  HENT:  Negative for congestion.   Eyes:  Negative for redness.  Respiratory:  Negative for shortness of breath.   Cardiovascular:  Negative for chest pain.  Gastrointestinal:  Negative for abdominal pain.  Genitourinary:  Negative for difficulty urinating.  Musculoskeletal:  Negative for neck pain and neck stiffness.  Skin:  Negative for rash.  Neurological:  Negative for headaches.  Psychiatric/Behavioral:  Negative for agitation.   All other systems reviewed and are negative.  Physical Exam Updated Vital Signs BP (!) 156/88   Pulse 72   Temp 98.3 F (36.8 C) (Oral)   Resp 18   Ht _0  (1.727 m)   Wt 113.4 kg   SpO2 96%   BMI 38.01 kg/m   Physical Exam Vitals and nursing note reviewed.  Constitutional:      General: He is not in acute distress.     Appearance: Normal appearance.  HENT:     Head: Normocephalic and atraumatic.     Nose: Nose normal.  Eyes:     Conjunctiva/sclera: Conjunctivae normal.     Pupils: Pupils are equal, round, and reactive to light.  Cardiovascular:     Rate and Rhythm: Normal rate and regular rhythm.     Pulses: Normal pulses.     Heart sounds: Normal heart sounds.  Pulmonary:     Effort: Pulmonary effort is normal.     Breath sounds: Normal breath sounds.  Abdominal:     General: Abdomen is flat.     Palpations: Abdomen is soft.     Tenderness: There is no abdominal tenderness. There is no guarding.     Comments: Gassy throughout   Musculoskeletal:  General: Normal range of motion.     Cervical back: Normal range of motion and neck supple.  Skin:    General: Skin is warm and dry.     Capillary Refill: Capillary refill takes less than 2 seconds.  Neurological:     General: No focal deficit present.     Mental Status: He is alert and oriented to person, place, and time.     Deep Tendon Reflexes: Reflexes normal.  Psychiatric:        Mood and Affect: Mood normal.        Behavior: Behavior normal.    ED Results / Procedures / Treatments   Labs (all labs ordered are listed, but only abnormal results are displayed) Results for orders placed or performed during the hospital encounter of 10/03/20  CBC with Differential/Platelet  Result Value Ref Range   WBC 15.3 (H) 4.0 - 10.5 K/uL   RBC 5.94 (H) 4.22 - 5.81 MIL/uL   Hemoglobin 15.1 13.0 - 17.0 g/dL   HCT 46.0 39.0 - 52.0 %   MCV 77.4 (L) 80.0 - 100.0 fL   MCH 25.4 (L) 26.0 - 34.0 pg   MCHC 32.8 30.0 - 36.0 g/dL   RDW 14.0 11.5 - 15.5 %   Platelets 333 150 - 400 K/uL   nRBC 0.0 0.0 - 0.2 %   Neutrophils Relative % 56 %   Neutro Abs 8.6 (H) 1.7 - 7.7 K/uL   Lymphocytes Relative 32 %   Lymphs Abs 5.0 (H) 0.7 - 4.0 K/uL   Monocytes Relative 7 %   Monocytes Absolute 1.1 (H) 0.1 - 1.0 K/uL   Eosinophils Relative 4 %   Eosinophils  Absolute 0.6 (H) 0.0 - 0.5 K/uL   Basophils Relative 1 %   Basophils Absolute 0.1 0.0 - 0.1 K/uL   Immature Granulocytes 0 %   Abs Immature Granulocytes 0.05 0.00 - 0.07 K/uL  Basic metabolic panel  Result Value Ref Range   Sodium 136 135 - 145 mmol/L   Potassium 3.5 3.5 - 5.1 mmol/L   Chloride 103 98 - 111 mmol/L   CO2 22 22 - 32 mmol/L   Glucose, Bld 164 (H) 70 - 99 mg/dL   BUN 13 6 - 20 mg/dL   Creatinine, Ser 1.06 0.61 - 1.24 mg/dL   Calcium 9.2 8.9 - 10.3 mg/dL   GFR, Estimated >60 >60 mL/min   Anion gap 11 5 - 15  Urinalysis, Routine w reflex microscopic Urine, Clean Catch  Result Value Ref Range   Color, Urine YELLOW YELLOW   APPearance CLEAR CLEAR   Specific Gravity, Urine 1.015 1.005 - 1.030   pH 6.0 5.0 - 8.0   Glucose, UA NEGATIVE NEGATIVE mg/dL   Hgb urine dipstick SMALL (A) NEGATIVE   Bilirubin Urine NEGATIVE NEGATIVE   Ketones, ur NEGATIVE NEGATIVE mg/dL   Protein, ur NEGATIVE NEGATIVE mg/dL   Nitrite NEGATIVE NEGATIVE   Leukocytes,Ua NEGATIVE NEGATIVE  Urinalysis, Microscopic (reflex)  Result Value Ref Range   RBC / HPF 0-5 0 - 5 RBC/hpf   WBC, UA 0-5 0 - 5 WBC/hpf   Bacteria, UA RARE (A) NONE SEEN   Squamous Epithelial / LPF 0-5 0 - 5  Troponin I (High Sensitivity)  Result Value Ref Range   Troponin I (High Sensitivity) 4 <18 ng/L  Troponin I (High Sensitivity)  Result Value Ref Range   Troponin I (High Sensitivity) 5 <18 ng/L   DG Chest Portable 1 View  Result Date: 10/03/2020 CLINICAL  DATA:  Substernal chest pain EXAM: PORTABLE CHEST 1 VIEW COMPARISON:  09/24/2020 FINDINGS: The heart size and mediastinal contours are within normal limits. Both lungs are clear. The visualized skeletal structures are unremarkable. IMPRESSION: No active disease. Electronically Signed   By: Merilyn Baba M.D.   On: 10/03/2020 02:36   DG Chest Portable 1 View  Result Date: 09/24/2020 CLINICAL DATA:  Dizzy EXAM: PORTABLE CHEST 1 VIEW COMPARISON:  September 20, 2020  FINDINGS: The heart size and mediastinal contours are within normal limits. Both lungs are clear. The visualized skeletal structures are unremarkable. IMPRESSION: No active disease. Electronically Signed   By: Abelardo Diesel M.D.   On: 09/24/2020 09:45   DG Chest Portable 1 View  Result Date: 09/20/2020 CLINICAL DATA:  tachycardia EXAM: PORTABLE CHEST 1 VIEW COMPARISON:  Jasmyne Lodato 13, 2022. FINDINGS: The heart size and mediastinal contours are within normal limits. Both lungs are clear. No visible pleural effusions or pneumothorax. No acute osseous abnormality. IMPRESSION: No active disease. Electronically Signed   By: Margaretha Sheffield M.D.   On: 09/20/2020 07:35    EKG See muse no change     Radiology DG Chest Portable 1 View  Result Date: 10/03/2020 CLINICAL DATA:  Substernal chest pain EXAM: PORTABLE CHEST 1 VIEW COMPARISON:  09/24/2020 FINDINGS: The heart size and mediastinal contours are within normal limits. Both lungs are clear. The visualized skeletal structures are unremarkable. IMPRESSION: No active disease. Electronically Signed   By: Merilyn Baba M.D.   On: 10/03/2020 02:36    Procedures Procedures   Medications Ordered in ED Medications  alum & mag hydroxide-simeth (MAALOX/MYLANTA) 200-200-20 MG/5ML suspension 30 mL (30 mLs Oral Given 10/03/20 0357)    ED Course  I have reviewed the triage vital signs and the nursing notes.  Pertinent labs & imaging results that were available during my care of the patient were reviewed by me and considered in my medical decision making (see chart for details).  BP improved on won, ruled out for MI.  All labs and imaging unremarkable.  Stable for discharge with close follow up.    Herbie Baltimore Bartolo Darter Myrla Halsted. was evaluated in [facility] on 10/03/2020 for the symptoms described in the history of present illness. He was evaluated in the context of the global COVID-19 pandemic, which necessitated consideration that the patient might be at  risk for infection with the SARS-CoV-2 virus that causes COVID-19. Institutional protocols and algorithms that pertain to the evaluation of patients at risk for COVID-19 are in a state of rapid change based on information released by regulatory bodies including the CDC and federal and state organizations. These policies and algorithms were followed during the patient's care in the ED.  Final Clinical Impression(s) / ED Diagnoses Final diagnoses:  None   Return for intractable cough, coughing up blood, fevers > 100.4 unrelieved by medication, shortness of breath, intractable vomiting, chest pain, shortness of breath, weakness, numbness, changes in speech, facial asymmetry, abdominal pain, passing out, Inability to tolerate liquids or food, cough, altered mental status or any concerns. No signs of systemic illness or infection. The patient is nontoxic-appearing on exam and vital signs are within normal limits. I have reviewed the triage vital signs and the nursing notes. Pertinent labs & imaging results that were available during my care of the patient were reviewed by me and considered in my medical decision making (see chart for details). After history, exam, and medical workup I feel the patient has been appropriately medically screened and  is safe for discharge home. Pertinent diagnoses were discussed with the patient. Patient was given return precautions. Rx / DC Orders ED Discharge Orders     None        Artha Chiasson, MD 10/03/20 0500

## 2020-10-04 ENCOUNTER — Other Ambulatory Visit: Payer: Self-pay

## 2020-10-04 NOTE — Telephone Encounter (Signed)
Pt called back and requested a call, [phone] is the best call back number for pt

## 2020-10-04 NOTE — Telephone Encounter (Signed)
Spoke to patient and scheduled him a hospital f/u on 9/23 @ 11:30. Dm/cma

## 2020-10-05 ENCOUNTER — Encounter: Payer: Self-pay | Admitting: Physician Assistant

## 2020-10-06 ENCOUNTER — Other Ambulatory Visit: Payer: Self-pay

## 2020-10-06 ENCOUNTER — Ambulatory Visit (INDEPENDENT_AMBULATORY_CARE_PROVIDER_SITE_OTHER): Payer: 59 | Admitting: Family Medicine

## 2020-10-06 VITALS — BP 126/78 | HR 76 | Temp 97.6°F | Ht 68.75 in | Wt 250.6 lb

## 2020-10-06 DIAGNOSIS — E876 Hypokalemia: Secondary | ICD-10-CM | POA: Diagnosis not present

## 2020-10-06 DIAGNOSIS — I10 Essential (primary) hypertension: Secondary | ICD-10-CM | POA: Diagnosis not present

## 2020-10-06 DIAGNOSIS — A040 Enteropathogenic Escherichia coli infection: Secondary | ICD-10-CM | POA: Diagnosis not present

## 2020-10-06 DIAGNOSIS — L304 Erythema intertrigo: Secondary | ICD-10-CM

## 2020-10-06 MED ORDER — CLOTRIMAZOLE 1 % EX CREA: 1.0000 "application " | TOPICAL_CREAM | Freq: Two times a day (BID) | CUTANEOUS | 0 refills | Status: DC

## 2020-10-06 NOTE — Progress Notes (Signed)
[facility] [address] [place] [address] Dept: [phone] Dept Fax: [phone]  Office Visit  Subjective:    Patient ID: Bryan Greet., male    DOB: Dec 26, 1965, 55 y.o..   MRN: [id number]  Chief Complaint  Patient presents with   Hospitalization [facility] f/u     History of Present Illness:  Patient is in today for follow-up froma  recent ED visit. [facility] was seen on 9/20 at [facility] ED with an elevated blood pressure. He was evaluated and an MI was ruled out. His blood pressure responded well to therapy. His potassium was noted to be at 3.5 (low end of normal range)he followed up with cardiology later the same day and was provided with a prescription for hydralazine for episodes where his BP was > 580 systolic. he notes this has not occurred since then. He also was noted to have an elevated WBC. [facility] English did have a recent enteropathogenic E coli infection with diarrhea. I had treated him with azithromycin last week. He has noted an occasional bout of diarrhea since the, including watery diarrhea this morning.  [facility] English also notes a rash he has under his abdominal pannus. He finds this irritating.   Past Medical History: Patient Active Problem List   Diagnosis Date Noted   Tachycardia 09/20/2020   Tachyarrhythmia 09/20/2020   Ventricular tachycardia, polymorphic (HCC)    Hypokalemia    Hypomagnesemia    History of colon polyps 05/08/2020   Hyperlipidemia associated with type 2 diabetes mellitus ([place]) 03/27/2020   Psoriasis 03/27/2020   Type 2 diabetes mellitus with hyperglycemia, without long-term current use of insulin ([place]) 06/12/2018   Class 2 severe obesity with serious comorbidity and body mass index (BMI) of 38.0 to 38.9 in adult [facility]) 06/12/2018   Nonischemic cardiomyopathy ([place]) 10/27/2017   Elevated coronary artery calcium score 10/14/2017   Essential  hypertension 10/14/2017   Past Surgical History:  Procedure Laterality Date   HAND SURGERY     LEFT HEART CATH AND CORONARY ANGIOGRAPHY N/A 10/29/2017   Procedure: LEFT HEART CATH AND CORONARY ANGIOGRAPHY;  Surgeon: [name], MD;  Location: [facility];  Service: Cardiovascular;  Laterality: N/A;   Family History  Adopted: Yes  Problem Relation Age of Onset   Diabetes Mother    Colon cancer Neg Hx    Colon polyps Neg Hx    Esophageal cancer Neg Hx    Rectal cancer Neg Hx    Stomach cancer Neg Hx    Outpatient Medications Prior to Visit  Medication Sig Dispense Refill   Blood Glucose Monitoring Suppl (TRUE METRIX AIR GLUCOSE METER) w/Device KIT 1 kit by Does not apply route 2 (two) times daily. 1 kit 0   clobetasol cream (TEMOVATE) 9.98 % Apply 1 application topically 2 (two) times daily. (Patient taking differently: Apply 1 application topically 2 (two) times daily as needed (for swelling, itching, or redness of affected areas).) 60 g prn   fluticasone (FLONASE) 50 MCG/ACT nasal spray Place 1 spray into both nostrils 2 (two) times daily. Reduce to once a day, after 1 month. (Patient taking differently: Place 1 spray into both nostrils 2 (two) times daily as needed for allergies or rhinitis.) 16 g 6   glucose blood (TRUE METRIX BLOOD GLUCOSE TEST) test strip Use as instructed to check blood sugars twice daily and as needed 100 each 3   metFORMIN (GLUCOPHAGE) 1000 MG  tablet TAKE 1 TABLET (1,000 MG TOTAL) BY MOUTH EVERY EVENING. (Patient taking differently: Take 1,000 mg by mouth at bedtime.) 90 tablet 1   metoprolol (TOPROL-XL) 200 MG 24 hr tablet TAKE 1 TABLET (200 MG TOTAL) BY MOUTH DAILY TAKE WITH OR IMMEDIATELY FOLLOWING A MEAL. (Patient taking differently: Take 200 mg by mouth daily after breakfast.) 30 tablet 9   pantoprazole (PROTONIX) 40 MG tablet Take 1 tablet (40 mg total) by mouth daily. (Patient taking differently: Take 40 mg by mouth daily before breakfast.) 90  tablet 4   potassium chloride SA (KLOR-CON) 20 MEQ tablet Take 1 tablet (20 mEq total) by mouth daily. 30 tablet 2   rosuvastatin (CRESTOR) 20 MG tablet Take 1 tablet (20 mg total) by mouth daily. 90 tablet 0   sacubitril-valsartan (ENTRESTO) 97-103 MG TAKE 1 TABLET BY MOUTH 2 (TWO) TIMES DAILY. (Patient taking differently: Take 1 tablet by mouth in the morning and at bedtime.) 60 tablet 6   TRUEPLUS LANCETS 28G MISC Use twice daily to check blood sugars and as needed 100 each 3   zolpidem (AMBIEN) 5 MG tablet Take 1 tablet (5 mg total) by mouth at bedtime as needed for sleep. 15 tablet 1   azithromycin (ZITHROMAX) 500 MG tablet Take 1 tablet (500 mg total) by mouth daily. (Patient not taking: Reported on 10/06/2020) 3 tablet 0   No facility-administered medications prior to visit.   Allergies  Allergen Reactions   Jardiance [Empagliflozin] Other (See Comments)    Vertigo      Objective:   Today's Vitals   10/06/20 1143  BP: 126/78  Pulse: 76  Temp: 97.6 F (36.4 C)  TempSrc: Temporal  SpO2: 97%  Weight: 250 lb 9.6 oz (113.7 kg)  Height: 5' 8.75" (1.746 m)   Body mass index is 37.28 kg/m.   General: Well developed, well nourished. No acute distress. Skin: There is an area of pink skin with a sharply demarcated border under the abdominal   pannus. Psych: Alert and oriented. Normal mood and affect.  Health Maintenance Due  Topic Date Due   FOOT EXAM  Never done   OPHTHALMOLOGY EXAM  Never done   Hepatitis C Screening  Never done   TETANUS/TDAP  Never done   Zoster Vaccines- Shingrix (1 of 2) Never done   COVID-19 Vaccine (3 - Pfizer risk series) 12/15/2019   INFLUENZA VACCINE  Never done   Lab Results Lab Results  Component Value Date   WBC 15.3 (H) 10/03/2020   HGB 15.1 10/03/2020   HCT 46.0 10/03/2020   MCV 77.4 (L) 10/03/2020   PLT 333 10/03/2020   BMP Latest Ref Rng & Units 10/03/2020 09/27/2020 09/24/2020  Glucose 70 - 99 mg/dL 164(H) 140(H) 149(H)  BUN 6 - 20  mg/dL $Remo'13 15 16  'kzhxv$ Creatinine 0.61 - 1.24 mg/dL 1.06 1.06 1.16  BUN/Creat Ratio 9 - 20 - - -  Sodium 135 - 145 mmol/L 136 134(L) 135  Potassium 3.5 - 5.1 mmol/L 3.5 4.2 3.6  Chloride 98 - 111 mmol/L 103 101 102  CO2 22 - 32 mmol/L $RemoveB'22 20 22  'tHDMkysM$ Calcium 8.9 - 10.3 mg/dL 9.2 9.7 9.3     Assessment & Plan:   1. Essential hypertension Bryan English's BP is at goal today. We will cotninue the plan to treat this with metoprolol and Entresto with hydralazine if needed for systolic BPs > 824.  2. Enteritis, enteropathogenic E. coli Bryan English had a persistent leukocytosis, without a specific shift in  his differential. This may be a sign of ongoing inflammation. I will have him repeat his CBC when he has a repeat potassium level drawn next week. In light of the persistent diarrhea, I will  - Ambulatory referral to Gastroenterology - CBC; Future  3. Hypokalemia Cardiology has ordered a repeat potassium. He is on supplementation at this point.  4. Intertrigo Recommend an antifungal cream to resolve.  - clotrimazole (LOTRIMIN) 1 % cream; Apply 1 application topically 2 (two) times daily.  Dispense: 30 g; Refill: 0  Haydee Salter, MD

## 2020-10-08 ENCOUNTER — Emergency Department (HOSPITAL_BASED_OUTPATIENT_CLINIC_OR_DEPARTMENT_OTHER)
Admission: EM | Admit: 2020-10-08 | Discharge: 2020-10-08 | Disposition: A | Discharge status: Home / Self Care | Payer: 59 | Attending: Emergency Medicine | Admitting: Emergency Medicine

## 2020-10-08 ENCOUNTER — Other Ambulatory Visit: Payer: Self-pay

## 2020-10-08 ENCOUNTER — Emergency Department (HOSPITAL_BASED_OUTPATIENT_CLINIC_OR_DEPARTMENT_OTHER): Payer: 59

## 2020-10-08 ENCOUNTER — Encounter (HOSPITAL_BASED_OUTPATIENT_CLINIC_OR_DEPARTMENT_OTHER): Payer: Self-pay | Admitting: Emergency Medicine

## 2020-10-08 DIAGNOSIS — E119 Type 2 diabetes mellitus without complications: Secondary | ICD-10-CM | POA: Insufficient documentation

## 2020-10-08 DIAGNOSIS — R143 Flatulence: Secondary | ICD-10-CM | POA: Diagnosis not present

## 2020-10-08 DIAGNOSIS — Z955 Presence of coronary angioplasty implant and graft: Secondary | ICD-10-CM | POA: Diagnosis not present

## 2020-10-08 DIAGNOSIS — I10 Essential (primary) hypertension: Secondary | ICD-10-CM | POA: Insufficient documentation

## 2020-10-08 DIAGNOSIS — R0789 Other chest pain: Secondary | ICD-10-CM | POA: Diagnosis present

## 2020-10-08 DIAGNOSIS — R079 Chest pain, unspecified: Secondary | ICD-10-CM

## 2020-10-08 LAB — COMPREHENSIVE METABOLIC PANEL
ALT: 24 U/L (ref 0–44)
AST: 17 U/L (ref 15–41)
Albumin: 4 g/dL (ref 3.5–5.0)
Alkaline Phosphatase: 78 U/L (ref 38–126)
Anion gap: 10 (ref 5–15)
BUN: 17 mg/dL (ref 6–20)
CO2: 23 mmol/L (ref 22–32)
Calcium: 9 mg/dL (ref 8.9–10.3)
Chloride: 103 mmol/L (ref 98–111)
Creatinine, Ser: 1 mg/dL (ref 0.61–1.24)
GFR, Estimated: >60 mL/min (ref >60)
Glucose, Bld: 143 mg/dL — ABNORMAL HIGH (ref 70–99)
Potassium: 3.5 mmol/L (ref 3.5–5.1)
Sodium: 136 mmol/L (ref 135–145)
Total Bilirubin: 0.4 mg/dL (ref 0.3–1.2)
Total Protein: 8.2 g/dL — ABNORMAL HIGH (ref 6.5–8.1)

## 2020-10-08 LAB — CBC
HCT: 46.4 % (ref 39.0–52.0)
Hemoglobin: 15.3 g/dL (ref 13.0–17.0)
MCH: 25.5 pg — ABNORMAL LOW (ref 26.0–34.0)
MCHC: 33 g/dL (ref 30.0–36.0)
MCV: 77.2 fL — ABNORMAL LOW (ref 80.0–100.0)
Platelets: 320 10*3/uL (ref 150–400)
RBC: 6.01 MIL/uL — ABNORMAL HIGH (ref 4.22–5.81)
RDW: 14.1 % (ref 11.5–15.5)
WBC: 15.3 10*3/uL — ABNORMAL HIGH (ref 4.0–10.5)
nRBC: 0 % (ref 0.0–0.2)

## 2020-10-08 LAB — TROPONIN I (HIGH SENSITIVITY)
Troponin I (High Sensitivity): 4 ng/L (ref <18)
Troponin I (High Sensitivity): 4 ng/L (ref <18)

## 2020-10-08 MED ORDER — POTASSIUM CHLORIDE CRYS ER 20 MEQ PO TBCR
20.0000 meq | EXTENDED_RELEASE_TABLET | Freq: Once | ORAL | Status: AC
  Administered 2020-10-08: 20 meq via ORAL
  Filled 2020-10-08: qty 1

## 2020-10-08 MED ORDER — ALUM & MAG HYDROXIDE-SIMETH 200-200-20 MG/5ML PO SUSP
30.0000 mL | Freq: Once | ORAL | Status: AC
  Administered 2020-10-08: 30 mL via ORAL
  Filled 2020-10-08: qty 30

## 2020-10-08 NOTE — ED Provider Notes (Signed)
[facility] Emergency Department Provider Note MRN:  [id number]  Arrival date & time: 10/08/20     Chief Complaint   Chest Pain   History of Present Illness   Bryan English. is a 55 y.o. year-old male with a history of hypertension, diabetes, cardiomyopathy presenting to the ED with chief complaint of chest pain.  Location: Central chest Duration: 30 minutes Onset: Sudden Timing: Const Description: Burning/pressure Severity: Mild to moderate Exacerbating/Alleviating Factors: None.  Woken from sleep Associated Symptoms: Flatulence Pertinent Negatives: No dizziness or diaphoresis, no nausea or vomiting, no trouble breathing  Additional History: None  Review of Systems  A complete 10 system review of systems was obtained and all systems are negative except as noted in the HPI and PMH.   Patient's Health History    Past Medical History:  Diagnosis Date   Diabetes mellitus without complication ([place])    GERD (gastroesophageal reflux disease)    Hyperlipidemia    Hypertension    NICM (nonischemic cardiomyopathy) ([place]) 10/29/2017   EF 20% by echo, minimal CAD at cath    Past Surgical History:  Procedure Laterality Date   HAND SURGERY     LEFT HEART CATH AND CORONARY ANGIOGRAPHY N/A 10/29/2017   Procedure: LEFT HEART CATH AND CORONARY ANGIOGRAPHY;  Surgeon: [name], MD;  Location: [facility];  Service: Cardiovascular;  Laterality: N/A;    Family History  Adopted: Yes  Problem Relation Age of Onset   Diabetes Mother    Colon cancer Neg Hx    Colon polyps Neg Hx    Esophageal cancer Neg Hx    Rectal cancer Neg Hx    Stomach cancer Neg Hx     Social History   Socioeconomic History   Marital status: Married    Spouse name: Not on file   Number of children: Not on file   Years of education: Not on file   Highest education level: Not on file  Occupational History   Not on file  Tobacco Use   Smoking status:  Never   Smokeless tobacco: Never  Vaping Use   Vaping Use: Never used  Substance and Sexual Activity   Alcohol use: Not Currently   Drug use: Never   Sexual activity: Not on file  Other Topics Concern   Not on file  Social History Narrative   Married.  Lives with wife.  Two children natural.      Social Determinants of Health   Financial Resource Strain: Not on file  Food Insecurity: Not on file  Transportation Needs: Not on file  Physical Activity: Not on file  Stress: Not on file  Social Connections: Not on file  Intimate Partner Violence: Not on file     Physical Exam   Vitals:   10/08/20 0640  SpO2: 100%    CONSTITUTIONAL: Well-appearing, NAD NEURO:  Alert and oriented x 3, no focal deficits EYES:  eyes equal and reactive ENT/NECK:  no LAD, no JVD CARDIO: Regular rate, well-perfused, normal S1 and S2 PULM:  CTAB no wheezing or rhonchi GI/GU:  normal bowel sounds, non-distended, non-tender MSK/SPINE:  No gross deformities, no edema SKIN:  no rash, atraumatic PSYCH:  Appropriate speech and behavior  *Additional and/or pertinent findings included in MDM below  Diagnostic and Interventional Summary    EKG Interpretation  Date/Time:  Sunday October 08 2020 06:37:55 EDT Ventricular Rate:  91 PR Interval:  195 QRS Duration: 103 QT Interval:  416 QTC  Calculation: 512 R Axis:   -35 Text Interpretation: Sinus rhythm Multiform ventricular premature complexes Low voltage, extremity and precordial leads Prolonged QT interval Confirmed by Gerlene Fee [id number]) on 10/08/2020 6:50:26 AM       Labs Reviewed  CBC  COMPREHENSIVE METABOLIC PANEL  TROPONIN I (HIGH SENSITIVITY)    DG Chest 2 View    (Results Pending)    Medications  alum & mag hydroxide-simeth (MAALOX/MYLANTA) 200-200-20 MG/5ML suspension 30 mL (has no administration in time range)     Procedures  /  Critical Care Procedures  ED Course and Medical Decision Making  I have reviewed the triage  vital signs, the nursing notes, and pertinent available records from the EMR.  Listed above are laboratory and imaging tests that I personally ordered, reviewed, and interpreted and then considered in my medical decision making (see below for details).  Differential diagnosis includes ACS, GERD, nothing to suggest PE, doubt dissection.  Overall favoring GERD.  Will need troponin x2.  Candidate for discharge if work-up reassuring, signed out to oncoming provider at shift change.       Bryan English. Bryan English, [place] [email or web address]  Final Clinical Impressions(s) / ED Diagnoses     ICD-10-CM   1. Chest pain, unspecified type  R07.9     2. Chest pain  R07.9 DG Chest 2 View    DG Chest 2 View      ED Discharge Orders     None        Discharge Instructions Discussed with and Provided to Patient:   Discharge Instructions   None       Bryan Flakes, MD 10/08/20 [phone]

## 2020-10-08 NOTE — ED Notes (Signed)
Pt discharged to home. Discharge instructions have been discussed with patient and/or family members. Pt verbally acknowledges understanding d/c instructions, and endorses comprehension to checkout at registration before leaving.  °

## 2020-10-08 NOTE — ED Provider Notes (Signed)
  Physical Exam  BP (!) 160/100   Pulse 77   Resp 19   Ht 5' 8.75" (1.746 m)   Wt 113.4 kg   SpO2 95%   BMI 37.19 kg/m   Physical Exam  ED Course/Procedures     Procedures  MDM  Received care of patient from Dr. [name]. Please see his note for prior history, physical and care.  Briefly this is a 55 y.o. 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  Delta troponins negative.  Recommend oupt follow up with Cardiology.     Gareth Morgan, MD 10/09/20 [phone]

## 2020-10-08 NOTE — ED Triage Notes (Signed)
Pt reports substernal CP that woke him from sleep ~30 min PTA. Pt states he felt his heart was racing and he felt clammy. Denies other sx. +hx of CP related to reflux. Pt states personal hx of HF.

## 2020-10-08 NOTE — ED Notes (Signed)
Attempted to draw blood 2x

## 2020-10-13 ENCOUNTER — Emergency Department (HOSPITAL_COMMUNITY): Payer: 59

## 2020-10-13 ENCOUNTER — Emergency Department (HOSPITAL_COMMUNITY)
Admission: EM | Admit: 2020-10-13 | Discharge: 2020-10-14 | Disposition: A | Discharge status: Home / Self Care | Payer: 59 | Attending: Emergency Medicine | Admitting: Emergency Medicine

## 2020-10-13 ENCOUNTER — Other Ambulatory Visit: Payer: Self-pay

## 2020-10-13 ENCOUNTER — Encounter (HOSPITAL_COMMUNITY): Payer: Self-pay | Admitting: *Deleted

## 2020-10-13 DIAGNOSIS — I10 Essential (primary) hypertension: Secondary | ICD-10-CM | POA: Insufficient documentation

## 2020-10-13 DIAGNOSIS — R197 Diarrhea, unspecified: Secondary | ICD-10-CM | POA: Diagnosis not present

## 2020-10-13 DIAGNOSIS — Z7984 Long term (current) use of oral hypoglycemic drugs: Secondary | ICD-10-CM | POA: Insufficient documentation

## 2020-10-13 DIAGNOSIS — Z79899 Other long term (current) drug therapy: Secondary | ICD-10-CM | POA: Insufficient documentation

## 2020-10-13 DIAGNOSIS — R079 Chest pain, unspecified: Secondary | ICD-10-CM | POA: Diagnosis not present

## 2020-10-13 DIAGNOSIS — R109 Unspecified abdominal pain: Secondary | ICD-10-CM | POA: Diagnosis not present

## 2020-10-13 DIAGNOSIS — E1169 Type 2 diabetes mellitus with other specified complication: Secondary | ICD-10-CM | POA: Insufficient documentation

## 2020-10-13 LAB — CBC WITH DIFFERENTIAL/PLATELET
Abs Immature Granulocytes: 0.05 10*3/uL (ref 0.00–0.07)
Basophils Absolute: 0.1 10*3/uL (ref 0.0–0.1)
Basophils Relative: 0 %
Eosinophils Absolute: 0.6 10*3/uL — ABNORMAL HIGH (ref 0.0–0.5)
Eosinophils Relative: 4 %
HCT: 47.2 % (ref 39.0–52.0)
Hemoglobin: 15.1 g/dL (ref 13.0–17.0)
Immature Granulocytes: 0 %
Lymphocytes Relative: 29 %
Lymphs Abs: 4.2 10*3/uL — ABNORMAL HIGH (ref 0.7–4.0)
MCH: 25.4 pg — ABNORMAL LOW (ref 26.0–34.0)
MCHC: 32 g/dL (ref 30.0–36.0)
MCV: 79.3 fL — ABNORMAL LOW (ref 80.0–100.0)
Monocytes Absolute: 0.9 10*3/uL (ref 0.1–1.0)
Monocytes Relative: 6 %
Neutro Abs: 8.8 10*3/uL — ABNORMAL HIGH (ref 1.7–7.7)
Neutrophils Relative %: 61 %
Platelets: 344 10*3/uL (ref 150–400)
RBC: 5.95 MIL/uL — ABNORMAL HIGH (ref 4.22–5.81)
RDW: 13.8 % (ref 11.5–15.5)
WBC: 14.6 10*3/uL — ABNORMAL HIGH (ref 4.0–10.5)
nRBC: 0 % (ref 0.0–0.2)

## 2020-10-13 LAB — COMPREHENSIVE METABOLIC PANEL
ALT: 35 U/L (ref 0–44)
AST: 20 U/L (ref 15–41)
Albumin: 3.6 g/dL (ref 3.5–5.0)
Alkaline Phosphatase: 74 U/L (ref 38–126)
Anion gap: 12 (ref 5–15)
BUN: 12 mg/dL (ref 6–20)
CO2: 23 mmol/L (ref 22–32)
Calcium: 9.8 mg/dL (ref 8.9–10.3)
Chloride: 101 mmol/L (ref 98–111)
Creatinine, Ser: 1.09 mg/dL (ref 0.61–1.24)
GFR, Estimated: >60 mL/min (ref >60)
Glucose, Bld: 125 mg/dL — ABNORMAL HIGH (ref 70–99)
Potassium: 4.4 mmol/L (ref 3.5–5.1)
Sodium: 136 mmol/L (ref 135–145)
Total Bilirubin: 0.4 mg/dL (ref 0.3–1.2)
Total Protein: 7.7 g/dL (ref 6.5–8.1)

## 2020-10-13 LAB — TROPONIN I (HIGH SENSITIVITY): Troponin I (High Sensitivity): 5 ng/L (ref <18)

## 2020-10-13 MED ORDER — LIDOCAINE VISCOUS HCL 2 % MT SOLN
15.0000 mL | Freq: Once | OROMUCOSAL | Status: AC
  Administered 2020-10-13: 15 mL via ORAL
  Filled 2020-10-13: qty 15

## 2020-10-13 MED ORDER — ALUM & MAG HYDROXIDE-SIMETH 200-200-20 MG/5ML PO SUSP
30.0000 mL | Freq: Once | ORAL | Status: AC
  Administered 2020-10-13: 30 mL via ORAL
  Filled 2020-10-13: qty 30

## 2020-10-13 NOTE — ED Triage Notes (Signed)
The pt is c/o chest pain that he has had for awhile and has been diagnosed with indigestion

## 2020-10-13 NOTE — ED Provider Notes (Signed)
[facility] EMERGENCY DEPARTMENT Provider Note   CSN: [id number] Arrival date & time: 10/13/20  2204     History Chief Complaint  Patient presents with   Chest Pain    Bryan English. is a 55 y.o. male.  HPI 55 year old male with a history of DM type II, GERD, hyperlipidemia, hypertension, nonischemic cardiomyopathy presents to the ER with complaints of "gas".  He states that he has had ongoing burping since his last visit here several days ago.  He came in complaining of chest pain and had a negative cardiac work-up, encouraged to follow-up with cardiology.  He states that he has not followed up with cardiology but has seen his PCP.  He has a pending endoscopy scheduled.  He has taken Maalox and Tums, and does take pantoprazole daily with little relief.  He endorses some occasional abdominal discomfort and some several loose stools but no vomiting, no fevers or chills.  Denies any dysuria hematuria.  Denies any dizziness or syncope.    Past Medical History:  Diagnosis Date   Diabetes mellitus without complication (H[facility])    GERD (gastroesophageal reflux disease)    Hyperlipidemia    Hypertension    NICM (nonischemic cardiomyopathy) ([place]) 10/29/2017   EF 20% by echo, minimal CAD at cath    Patient Active Problem List   Diagnosis Date Noted   Tachycardia 09/20/2020   Tachyarrhythmia 09/20/2020   Ventricular tachycardia, polymorphic    Hypokalemia    Hypomagnesemia    History of colon polyps 05/08/2020   Hyperlipidemia associated with type 2 diabetes mellitus ([place]) 03/27/2020   Psoriasis 03/27/2020   Type 2 diabetes mellitus with hyperglycemia, without long-term current use of insulin ([place]) 06/12/2018   Class 2 severe obesity with serious comorbidity and body mass index (BMI) of 38.0 to 38.9 in adult [facility]) 06/12/2018   Nonischemic cardiomyopathy ([place]) 10/27/2017   Elevated coronary artery calcium score 10/14/2017   Essential hypertension  10/14/2017    Past Surgical History:  Procedure Laterality Date   HAND SURGERY     LEFT HEART CATH AND CORONARY ANGIOGRAPHY N/A 10/29/2017   Procedure: LEFT HEART CATH AND CORONARY ANGIOGRAPHY;  Surgeon: [name], MD;  Location: [facility];  Service: Cardiovascular;  Laterality: N/A;       Family History  Adopted: Yes  Problem Relation Age of Onset   Diabetes Mother    Colon cancer Neg Hx    Colon polyps Neg Hx    Esophageal cancer Neg Hx    Rectal cancer Neg Hx    Stomach cancer Neg Hx     Social History   Tobacco Use   Smoking status: Never   Smokeless tobacco: Never  Vaping Use   Vaping Use: Never used  Substance Use Topics   Alcohol use: Not Currently   Drug use: Never    Home Medications Prior to Admission medications   Medication Sig Start Date End Date Taking? Authorizing Provider  Blood Glucose Monitoring Suppl (TRUE METRIX AIR GLUCOSE METER) w/Device KIT 1 kit by Does not apply route 2 (two) times daily. 12/02/17   [name], [name], MD  clobetasol cream (TEMOVATE) 7.07 % Apply 1 application topically 2 (two) times daily. Patient taking differently: Apply 1 application topically 2 (two) times daily as needed (for swelling, itching, or redness of affected areas). 03/27/20   Haydee Salter, MD  clotrimazole (LOTRIMIN) 1 % cream Apply 1 application topically 2 (two) times daily. 10/06/20   Arlester Marker  M, MD  fluticasone (FLONASE) 50 MCG/ACT nasal spray Place 1 spray into both nostrils 2 (two) times daily. Reduce to once a day, after 1 month. Patient taking differently: Place 1 spray into both nostrils 2 (two) times daily as needed for allergies or rhinitis. 09/05/20 10/06/20  Haydee Salter, MD  glucose blood (TRUE METRIX BLOOD GLUCOSE TEST) test strip Use as instructed to check blood sugars twice daily and as needed 05/16/20   Haydee Salter, MD  metFORMIN (GLUCOPHAGE) 1000 MG tablet TAKE 1 TABLET (1,000 MG TOTAL) BY MOUTH EVERY EVENING. Patient taking  differently: Take 1,000 mg by mouth at bedtime. 09/05/20 09/05/21  Haydee Salter, MD  metoprolol (TOPROL-XL) 200 MG 24 hr tablet TAKE 1 TABLET (200 MG TOTAL) BY MOUTH DAILY TAKE WITH OR IMMEDIATELY FOLLOWING A MEAL. Patient taking differently: Take 200 mg by mouth daily after breakfast. 05/03/20 05/03/21  [name], MD  pantoprazole (PROTONIX) 40 MG tablet Take 1 tablet (40 mg total) by mouth daily. Patient taking differently: Take 40 mg by mouth daily before breakfast. 03/27/20   Haydee Salter, MD  potassium chloride SA (KLOR-CON) 20 MEQ tablet Take 1 tablet (20 mEq total) by mouth daily. 09/21/20   Cheryln Manly, NP  rosuvastatin (CRESTOR) 20 MG tablet Take 1 tablet (20 mg total) by mouth daily. 09/22/20   Cheryln Manly, NP  sacubitril-valsartan (ENTRESTO) 97-103 MG TAKE 1 TABLET BY MOUTH 2 (TWO) TIMES DAILY. Patient taking differently: Take 1 tablet by mouth in the morning and at bedtime. 04/03/20 04/03/21  [name], MD  TRUEPLUS LANCETS 28G MISC Use twice daily to check blood sugars and as needed 12/02/17   [name], [name], MD  zolpidem (AMBIEN) 5 MG tablet Take 1 tablet (5 mg total) by mouth at bedtime as needed for sleep. 09/05/20   Haydee Salter, MD    Allergies    Jardiance [empagliflozin]  Review of Systems   Review of Systems Ten systems reviewed and are negative for acute change, except as noted in the HPI.   Physical Exam Updated Vital Signs BP 119/79   Pulse 71   Temp 98.2 F (36.8 C) (Oral)   Resp 19   Ht 5' 8" (1.727 m)   Wt 113.4 kg   SpO2 98%   BMI 38.01 kg/m   Physical Exam Vitals and nursing note reviewed.  Constitutional:      General: He is not in acute distress.    Appearance: He is well-developed. He is not ill-appearing, toxic-appearing or diaphoretic.  HENT:     Head: Normocephalic and atraumatic.  Eyes:     Conjunctiva/sclera: Conjunctivae normal.  Cardiovascular:     Rate and Rhythm: Normal rate and regular rhythm.     Heart sounds:  No murmur heard. Pulmonary:     Effort: Pulmonary effort is normal. No respiratory distress.     Breath sounds: Normal breath sounds.  Abdominal:     Palpations: Abdomen is soft.     Tenderness: There is no abdominal tenderness. There is no right CVA tenderness or left CVA tenderness.  Musculoskeletal:        General: Normal range of motion.     Cervical back: Neck supple.  Skin:    General: Skin is warm and dry.     Capillary Refill: Capillary refill takes less than 2 seconds.  Neurological:     General: No focal deficit present.     Mental Status: He is alert and oriented to person, place,  and time.     Sensory: No sensory deficit.     Motor: No weakness.    ED Results / Procedures / Treatments   Labs (all labs ordered are listed, but only abnormal results are displayed) Labs Reviewed  CBC WITH DIFFERENTIAL/PLATELET - Abnormal; Notable for the following components:      Result Value   WBC 14.6 (*)    RBC 5.95 (*)    MCV 79.3 (*)    MCH 25.4 (*)    Neutro Abs 8.8 (*)    Lymphs Abs 4.2 (*)    Eosinophils Absolute 0.6 (*)    All other components within normal limits  COMPREHENSIVE METABOLIC PANEL - Abnormal; Notable for the following components:   Glucose, Bld 125 (*)    All other components within normal limits  TROPONIN I (HIGH SENSITIVITY)  TROPONIN I (HIGH SENSITIVITY)    EKG None  Radiology DG Chest 2 View  Result Date: 10/13/2020 CLINICAL DATA:  Chest pain, indigestion EXAM: CHEST - 2 VIEW COMPARISON:  10/08/2020 FINDINGS: The heart size and mediastinal contours are within normal limits. Both lungs are clear. The visualized skeletal structures are unremarkable. IMPRESSION: No active cardiopulmonary disease. Electronically Signed   By: Randa Ngo M.D.   On: 10/13/2020 23:06    Procedures Procedures   Medications Ordered in ED Medications  alum & mag hydroxide-simeth (MAALOX/MYLANTA) 200-200-20 MG/5ML suspension 30 mL (30 mLs Oral Given 10/13/20 2312)     And  lidocaine (XYLOCAINE) 2 % viscous mouth solution 15 mL (15 mLs Oral Given 10/13/20 2312)    ED Course  I have reviewed the triage vital signs and the nursing notes.  Pertinent labs & imaging results that were available during my care of the patient were reviewed by me and considered in my medical decision making (see chart for details).    MDM Rules/Calculators/A&P                           Patient here complaining of gastric reflux type symptoms.  On arrival, he is slightly hypertensive, however not tachycardic, tachypneic or hypoxic.  Overall well-appearing and resting comfortably in ER bed.  Physical exam overall benign, abdomen is soft and nontender.  He states that his chest pain has not changed in character or severity since his last ER visit he feels as though it is more gas related rather than cardiac.  We discussed different options for him, we will give him GI cocktail, repeat cardiac work-up however my suspicion for this is low.     I encouraged the patient to switch from pantoprazole to omeprazole and avoid high fatty foods.  Stressed follow-up with GI. Final Clinical Impression(s) / ED Diagnoses Final diagnoses:  Nonspecific chest pain    Rx / DC Orders ED Discharge Orders     None        Garald Balding, PA-C 10/14/20 [place], [place], DO 10/14/20 [phone]

## 2020-10-14 LAB — BASIC METABOLIC PANEL
BUN/Creatinine Ratio: 14 (ref 9–20)
BUN: 15 mg/dL (ref 6–24)
CO2: 21 mmol/L (ref 20–29)
Calcium: 10.1 mg/dL (ref 8.7–10.2)
Chloride: 97 mmol/L (ref 96–106)
Creatinine, Ser: 1.08 mg/dL (ref 0.76–1.27)
Glucose: 123 mg/dL — ABNORMAL HIGH (ref 70–99)
Potassium: 5.3 mmol/L — ABNORMAL HIGH (ref 3.5–5.2)
Sodium: 136 mmol/L (ref 134–144)
eGFR: 81 mL/min/{1.73_m2} (ref >59)

## 2020-10-14 LAB — TROPONIN I (HIGH SENSITIVITY): Troponin I (High Sensitivity): 5 ng/L (ref <18)

## 2020-10-14 NOTE — ED Provider Notes (Signed)
[facility] EMERGENCY DEPARTMENT Provider Note   CSN: [id number] Arrival date & time: 10/13/20  2204     History Chief Complaint  Patient presents with   Chest Pain    Bryan English. is a 55 y.o. male.  55 yo M with a chief complaints of chest pain.  Feels like a burning in his chest.  He had this problem off and on for the past few weeks.  For like it got worse this evening and so came in to be evaluated.  Symptoms usually improve with Maalox at home.  He denies exertional symptoms.  Has seen a cardiologist for this who thought it was noncardiac.  He has been referred to GI and has an appointment in 10 days.  The history is provided by the patient.  Chest Pain Pain location:  Epigastric Pain quality: burning   Pain radiates to:  Does not radiate Pain severity:  Moderate Onset quality:  Gradual Duration:  2 weeks Timing:  Intermittent Progression:  Waxing and waning Chronicity:  New Relieved by:  Nothing Worsened by:  Nothing Ineffective treatments:  None tried Associated symptoms: no abdominal pain, no fever, no headache, no palpitations, no shortness of breath and no vomiting       Past Medical History:  Diagnosis Date   Diabetes mellitus without complication (HCC)    GERD (gastroesophageal reflux disease)    Hyperlipidemia    Hypertension    NICM (nonischemic cardiomyopathy) ([place]) 10/29/2017   EF 20% by echo, minimal CAD at cath    Patient Active Problem List   Diagnosis Date Noted   Tachycardia 09/20/2020   Tachyarrhythmia 09/20/2020   Ventricular tachycardia, polymorphic    Hypokalemia    Hypomagnesemia    History of colon polyps 05/08/2020   Hyperlipidemia associated with type 2 diabetes mellitus ([place]) 03/27/2020   Psoriasis 03/27/2020   Type 2 diabetes mellitus with hyperglycemia, without long-term current use of insulin (HCC) 06/12/2018   Class 2 severe obesity with serious comorbidity and body mass index (BMI) of 38.0 to  38.9 in adult [facility]) 06/12/2018   Nonischemic cardiomyopathy ([place]) 10/27/2017   Elevated coronary artery calcium score 10/14/2017   Essential hypertension 10/14/2017    Past Surgical History:  Procedure Laterality Date   HAND SURGERY     LEFT HEART CATH AND CORONARY ANGIOGRAPHY N/A 10/29/2017   Procedure: LEFT HEART CATH AND CORONARY ANGIOGRAPHY;  Surgeon: [name], MD;  Location: [place] [facility];  Service: Cardiovascular;  Laterality: N/A;       Family History  Adopted: Yes  Problem Relation Age of Onset   Diabetes Mother    Colon cancer Neg Hx    Colon polyps Neg Hx    Esophageal cancer Neg Hx    Rectal cancer Neg Hx    Stomach cancer Neg Hx     Social History   Tobacco Use   Smoking status: Never   Smokeless tobacco: Never  Vaping Use   Vaping Use: Never used  Substance Use Topics   Alcohol use: Not Currently   Drug use: Never    Home Medications Prior to Admission medications   Medication Sig Start Date End Date Taking? Authorizing Provider  Blood Glucose Monitoring Suppl (TRUE METRIX AIR GLUCOSE METER) w/Device KIT 1 kit by Does not apply route 2 (two) times daily. 12/02/17   [name], [name], MD  clobetasol cream (TEMOVATE) 1.94 % Apply 1 application topically 2 (two) times daily. Patient taking differently: Apply 1  application topically 2 (two) times daily as needed (for swelling, itching, or redness of affected areas). 03/27/20   Haydee Salter, MD  clotrimazole (LOTRIMIN) 1 % cream Apply 1 application topically 2 (two) times daily. 10/06/20   Haydee Salter, MD  fluticasone (FLONASE) 50 MCG/ACT nasal spray Place 1 spray into both nostrils 2 (two) times daily. Reduce to once a day, after 1 month. Patient taking differently: Place 1 spray into both nostrils 2 (two) times daily as needed for allergies or rhinitis. 09/05/20 10/06/20  Haydee Salter, MD  glucose blood (TRUE METRIX BLOOD GLUCOSE TEST) test strip Use as instructed to check blood sugars twice daily  and as needed 05/16/20   Haydee Salter, MD  metFORMIN (GLUCOPHAGE) 1000 MG tablet TAKE 1 TABLET (1,000 MG TOTAL) BY MOUTH EVERY EVENING. Patient taking differently: Take 1,000 mg by mouth at bedtime. 09/05/20 09/05/21  Haydee Salter, MD  metoprolol (TOPROL-XL) 200 MG 24 hr tablet TAKE 1 TABLET (200 MG TOTAL) BY MOUTH DAILY TAKE WITH OR IMMEDIATELY FOLLOWING A MEAL. Patient taking differently: Take 200 mg by mouth daily after breakfast. 05/03/20 05/03/21  [name], MD  pantoprazole (PROTONIX) 40 MG tablet Take 1 tablet (40 mg total) by mouth daily. Patient taking differently: Take 40 mg by mouth daily before breakfast. 03/27/20   Haydee Salter, MD  potassium chloride SA (KLOR-CON) 20 MEQ tablet Take 1 tablet (20 mEq total) by mouth daily. 09/21/20   Cheryln Manly, NP  rosuvastatin (CRESTOR) 20 MG tablet Take 1 tablet (20 mg total) by mouth daily. 09/22/20   Cheryln Manly, NP  sacubitril-valsartan (ENTRESTO) 97-103 MG TAKE 1 TABLET BY MOUTH 2 (TWO) TIMES DAILY. Patient taking differently: Take 1 tablet by mouth in the morning and at bedtime. 04/03/20 04/03/21  [name], MD  TRUEPLUS LANCETS 28G MISC Use twice daily to check blood sugars and as needed 12/02/17   [name], [name], MD  zolpidem (AMBIEN) 5 MG tablet Take 1 tablet (5 mg total) by mouth at bedtime as needed for sleep. 09/05/20   Haydee Salter, MD    Allergies    Jardiance [empagliflozin]  Review of Systems   Review of Systems  Constitutional:  Negative for chills and fever.  HENT:  Negative for congestion and facial swelling.   Eyes:  Negative for discharge and visual disturbance.  Respiratory:  Negative for shortness of breath.   Cardiovascular:  Positive for chest pain. Negative for palpitations.  Gastrointestinal:  Negative for abdominal pain, diarrhea and vomiting.  Musculoskeletal:  Negative for arthralgias and myalgias.  Skin:  Negative for color change and rash.  Neurological:  Negative for tremors, syncope  and headaches.  Psychiatric/Behavioral:  Negative for confusion and dysphoric mood.    Physical Exam Updated Vital Signs BP 135/84   Pulse 73   Temp 98.9 F (37.2 C) (Oral)   Resp 18   Ht $R'5\' 8"'kV$  (1.727 m)   Wt 113.4 kg   SpO2 97%   BMI 38.01 kg/m   Physical Exam Vitals and nursing note reviewed.  Constitutional:      Appearance: He is well-developed.     Comments: BMI 38  HENT:     Head: Normocephalic and atraumatic.  Eyes:     Pupils: Pupils are equal, round, and reactive to light.  Neck:     Vascular: No JVD.  Cardiovascular:     Rate and Rhythm: Normal rate and regular rhythm.     Heart sounds: No murmur  heard.   No friction rub. No gallop.  Pulmonary:     Effort: No respiratory distress.     Breath sounds: No wheezing.  Abdominal:     General: There is no distension.     Tenderness: There is no abdominal tenderness. There is no guarding or rebound.  Musculoskeletal:        General: Normal range of motion.     Cervical back: Normal range of motion and neck supple.  Skin:    Coloration: Skin is not pale.     Findings: No rash.  Neurological:     Mental Status: He is alert and oriented to person, place, and time.  Psychiatric:        Behavior: Behavior normal.    ED Results / Procedures / Treatments   Labs (all labs ordered are listed, but only abnormal results are displayed) Labs Reviewed  CBC WITH DIFFERENTIAL/PLATELET - Abnormal; Notable for the following components:      Result Value   WBC 14.6 (*)    RBC 5.95 (*)    MCV 79.3 (*)    MCH 25.4 (*)    Neutro Abs 8.8 (*)    Lymphs Abs 4.2 (*)    Eosinophils Absolute 0.6 (*)    All other components within normal limits  COMPREHENSIVE METABOLIC PANEL - Abnormal; Notable for the following components:   Glucose, Bld 125 (*)    All other components within normal limits  TROPONIN I (HIGH SENSITIVITY)  TROPONIN I (HIGH SENSITIVITY)    EKG None  Radiology DG Chest 2 View  Result Date:  10/13/2020 CLINICAL DATA:  Chest pain, indigestion EXAM: CHEST - 2 VIEW COMPARISON:  10/08/2020 FINDINGS: The heart size and mediastinal contours are within normal limits. Both lungs are clear. The visualized skeletal structures are unremarkable. IMPRESSION: No active cardiopulmonary disease. Electronically Signed   By: Randa Ngo M.D.   On: 10/13/2020 23:06    Procedures Procedures   Medications Ordered in ED Medications  alum & mag hydroxide-simeth (MAALOX/MYLANTA) 200-200-20 MG/5ML suspension 30 mL (30 mLs Oral Given 10/13/20 2312)    And  lidocaine (XYLOCAINE) 2 % viscous mouth solution 15 mL (15 mLs Oral Given 10/13/20 2312)    ED Course  I have reviewed the triage vital signs and the nursing notes.  Pertinent labs & imaging results that were available during my care of the patient were reviewed by me and considered in my medical decision making (see chart for details).    MDM Rules/Calculators/A&P                           55 yo M with a chief complaints of chest pain.  Atypical in nature nonexertional.  Patient has had multiple visits in the ED for this in the past month.  EKG is unchanged 2 troponins are negative leukocytosis is not drastically changed over the course of these 3 weeks.  I discussed the results with the patient.  We will do a trial of H2 blockers.  Given a list of foods to avoid.  Of note the patient has been drinking an entire bottle of water right before lying back to sleep.  Cautioned him to attempt a trial of not eating or drinking anything 4 hours prior to bed.  He has GI follow-up scheduled in 10 days time.  2:25 AM:  I have discussed the diagnosis/risks/treatment options with the patient and friend  and believe the pt to be  eligible for discharge home to follow-up with PCP, GI. We also discussed returning to the ED immediately if new or worsening sx occur. We discussed the sx which are most concerning (e.g., sudden worsening pain, fever, inability to tolerate  by mouth, exertional symptoms) that necessitate immediate return. Medications administered to the patient during their visit and any new prescriptions provided to the patient are listed below.  Medications given during this visit Medications  alum & mag hydroxide-simeth (MAALOX/MYLANTA) 200-200-20 MG/5ML suspension 30 mL (30 mLs Oral Given 10/13/20 2312)    And  lidocaine (XYLOCAINE) 2 % viscous mouth solution 15 mL (15 mLs Oral Given 10/13/20 2312)     The patient appears reasonably screen and/or stabilized for discharge and I doubt any other medical condition or other [facility] requiring further screening, evaluation, or treatment in the ED at this time prior to discharge.   Final Clinical Impression(s) / ED Diagnoses Final diagnoses:  Nonspecific chest pain    Rx / DC Orders ED Discharge Orders     None        Deno Etienne, DO 10/14/20 0225

## 2020-10-14 NOTE — Discharge Instructions (Signed)
Try pepcid or tagamet up to twice a day.  Try to avoid things that may make this worse, most commonly these are spicy foods tomato based products fatty foods chocolate and peppermint.  Alcohol and tobacco can also make this worse.  Return to the emergency department for sudden worsening pain fever or inability to eat or drink.  

## 2020-10-17 ENCOUNTER — Telehealth: Payer: Self-pay

## 2020-10-17 NOTE — Progress Notes (Signed)
Kidney function is stable, but potassium is high. This potassium is likely falsely high. Aware the patient went to the ED on the same day, repeat blood work from ED reviewed which showed normal potassium level. ED mentions atypical chest pain, please make sure his chest pain improved.

## 2020-10-17 NOTE — Telephone Encounter (Addendum)
Called and left a voice message for patient to give me a call back at the office. I will try calling patient again.  ----- Message from Almyra Deforest, [place] sent at 10/17/2020 10:12 AM EDT ----- Kidney function is stable, but potassium is high. This potassium is likely falsely high. Aware the patient went to the ED on the same day, repeat blood work from ED reviewed which showed normal potassium level. ED mentions atypical chest pain, please  make sure his chest pain improved.

## 2020-10-19 ENCOUNTER — Other Ambulatory Visit: Payer: Self-pay

## 2020-10-19 ENCOUNTER — Other Ambulatory Visit (INDEPENDENT_AMBULATORY_CARE_PROVIDER_SITE_OTHER): Payer: 59

## 2020-10-19 DIAGNOSIS — A040 Enteropathogenic Escherichia coli infection: Secondary | ICD-10-CM

## 2020-10-19 NOTE — Progress Notes (Signed)
Per the orders of Dr. Gena Fray pt is here for labs pt tolerated draw well.

## 2020-10-20 LAB — CBC
HCT: 43.4 % (ref 39.0–52.0)
Hemoglobin: 14.2 g/dL (ref 13.0–17.0)
MCHC: 32.7 g/dL (ref 30.0–36.0)
MCV: 77.8 fl — ABNORMAL LOW (ref 78.0–100.0)
Platelets: 274 10*3/uL (ref 150.0–400.0)
RBC: 5.58 Mil/uL (ref 4.22–5.81)
RDW: 14.1 % (ref 11.5–15.5)
WBC: 10 10*3/uL (ref 4.0–10.5)

## 2020-10-24 ENCOUNTER — Encounter: Payer: Self-pay | Admitting: Gastroenterology

## 2020-10-24 ENCOUNTER — Ambulatory Visit (INDEPENDENT_AMBULATORY_CARE_PROVIDER_SITE_OTHER): Payer: 59 | Admitting: Gastroenterology

## 2020-10-24 VITALS — BP 134/82 | HR 73 | Ht 68.0 in | Wt 255.0 lb

## 2020-10-24 DIAGNOSIS — K219 Gastro-esophageal reflux disease without esophagitis: Secondary | ICD-10-CM | POA: Diagnosis not present

## 2020-10-24 DIAGNOSIS — Z86010 Personal history of colonic polyps: Secondary | ICD-10-CM | POA: Diagnosis not present

## 2020-10-24 DIAGNOSIS — R194 Change in bowel habit: Secondary | ICD-10-CM

## 2020-10-24 NOTE — Progress Notes (Signed)
    History of Present Illness: This is a 55 year old male with diarrhea, constipation, gas.  He relates difficulties with persistent diarrhea that began in August.  He was hospitalized with hypokalemia and hypomagnesemia that led to a tachyarrhythmia.  A GI stool profile in September showed enteropathogenic E. coli and the patient relates he was treated with a 3-day course of antibiotics.  He relates that his diarrhea has substantially improved and now he relates loose stools alternating with small pellet-like stools and increased intestinal gas.  He relates frequent active reflux symptoms and his pantoprazole was increased a few days ago from daily to twice daily and he has not noted any improvement.   Colonoscopy April 2022:  - One 6 mm polyp in the cecum, removed with a cold snare. Resected and retrieved. - Erythematous mucosa at the ileocecal valve. Biopsied. - Mild diverticulosis in the left colon. - The examination was otherwise normal on direct and retroflexion views. Path: tubular adenoma and hyperemia at IC valve  Current Medications, Allergies, Past Medical History, Past Surgical History, Family History and Social History were reviewed in [employer] record.   Physical Exam: General: Well developed, well nourished, no acute distress Head: Normocephalic and atraumatic Eyes: Sclerae anicteric, EOMI Ears: Normal auditory acuity Mouth: Not examined, mask on during Covid-19 pandemic Lungs: Clear throughout to auscultation Heart: Regular rate and rhythm; no murmurs, rubs or bruits Abdomen: Soft, non tender and non distended. No masses, hepatosplenomegaly or hernias noted. Normal Bowel sounds Rectal: Not done Musculoskeletal: Symmetrical with no gross deformities  Pulses:  Normal pulses noted Extremities: No clubbing, cyanosis, edema or deformities noted Neurological: Alert oriented x 4, grossly nonfocal Psychological:  Alert and cooperative. Normal mood and  affect   Assessment and Recommendations:  Change in bowel habits. Gas.  Suspected postinfectious diarrhea that is resolving.  Recommended a lactose free diet x 1 week, Align qd for 1 month, a low gas diet and Gas-X or Gaviscon qid prn.  Advised to keep a food and beverage diary to determine if any particular foods or beverages are causing symptoms.  Maintain a high-fiber diet with adequate daily water intake.  REV in 6 weeks.  GERD. Protonix increased to BID and remain on that dose. Follow antireflux measures.   Personal history of an adenomatous colon polyp.  A 7-year interval surveillance colonoscopy is recommended in April 2029.

## 2020-10-24 NOTE — Patient Instructions (Addendum)
Take over the counter Align probiotic daily x 1 month.   Remain on pantoprazole 40 mg twice daily.   We have given you a GERD diet and low gas diet.   You can take over the counter Gas-x or Gaviscon four times a day.   Start a lactose free diet x 1 week.   Lactose-Free Diet, Adult If you have lactose intolerance, you are not able to digest lactose. Lactose is a natural sugar found mainly in dairy milk and dairy products. A lactose-free diet can help you avoid foods and beverages that contain lactose. What are tips for following this plan? Reading food labels Do not consume foods, beverages, vitamins, minerals, or medicines containing lactose. Read ingredient lists carefully. Look for the words "lactose-free" on labels. Meal planning Use alternatives to dairy milk and foods made with milk products. These include the following: Lactose-free milk. Soy milk with added calcium and vitamin D. Almond milk, coconut milk, rice milk, or other nondairy milk alternatives with added calcium and vitamin D. Note that a lot of these are low in protein. Soy products, such as soy yogurt, soy cheese, soy ice cream, and soy-based sour cream. Other nut milk products, such as almond yogurt, almond cheese, cashew yogurt, cashew cheese, cashew ice cream, coconut yogurt, and coconut ice cream. Medicines, vitamins, and supplements Use lactase enzyme drops or tablets as directed by your health care provider. Make sure you get enough calcium and vitamin D in your diet. A lactose-free eating plan can be lacking in these important nutrients. Take calcium and vitamin D supplements as directed by your health care provider. Talk with your health care provider about supplements if you are not able to get enough calcium and vitamin D from food. What foods should I eat? Fruits All fresh, canned, frozen, or dried fruits and fruit juices that are not processed with lactose. Vegetables All fresh, frozen, and canned  vegetables without cheese, cream, or butter sauces. Grains Any that are not made with dairy milk or dairy products. Meats and other proteins Any meat, fish, poultry, and other protein sources that are not made with dairy milk or dairy products. Fats and oils Any that are not made with dairy milk or dairy products. Sweets and desserts Any that are not made with dairy milk or dairy products. Seasonings and condiments Any that are not made with dairy milk or dairy products. Calcium Calcium is found in many foods that contain lactose and is important for bone health. The amount of calcium you need depends on your age: Adults younger than 50 years: 1,000 mg of calcium a day. Adults older than 50 years: 1,200 mg of calcium a day. If you are not getting enough calcium, you may get it from other sources, including: Orange juice that has been fortified with calcium. This means that calcium has been added to the product. There are 300-350 mg of calcium in 1 cup (237 mL) of calcium-fortified orange juice. Soy milk fortified with calcium. There are 300-400 mg of calcium in 1 cup (237 mL) of calcium-fortified soy milk. Rice or almond milk fortified with calcium. There are 300 mg of calcium in 1 cup (237 mL) of calcium-fortified rice or almond milk. Breakfast cereals fortified with calcium. There are 100-1,000 mg of calcium in calcium-fortified breakfast cereals. Spinach, cooked. There are 145 mg of calcium in  cup (90 g) of cooked spinach. Edamame, cooked. There are 130 mg of calcium in  cup (47 g) of cooked edamame. Collard greens,  cooked. There are 125 mg of calcium in  cup (85 g) of cooked collard greens. Kale, frozen or cooked. There are 90 mg of calcium in  cup (59 g) of cooked or frozen kale. Almonds. There are 95 mg of calcium in  cup (35 g) of almonds. Broccoli, cooked. There are 60 mg of calcium in 1 cup (156 g) of cooked broccoli. The items listed above may not be a complete list of foods  and beverages you can eat. Contact a dietitian for more options. What foods should I avoid? Lactose is found in dairy milk and dairy products, such as: Yogurt. Cheese. Butter. Margarine. Sour cream. Cream. Whipped toppings and creamers. Ice cream and other dairy-based desserts. Lactose is also found in foods or products made with dairy milk or milk ingredients. To find out whether a food contains dairy milk or a milk ingredient, look at the ingredients list. Avoid foods with the statement "May contain milk" and foods that contain: Milk powder. Whey. Curd. Lactose. Lactoglobulin. The items listed above may not be a complete list of foods and beverages to avoid. Contact a dietitian for more information. Where to find more information [employer] of Diabetes and Digestive and Kidney Diseases: [email or web address] Summary If you are lactose intolerant, it means that you are not able to digest lactose, a natural sugar found in milk and milk products. Following a lactose-free diet can help you manage this condition. Calcium is important for bone health and is found in many foods that contain lactose. Talk with your health care provider about other sources of calcium. This information is not intended to replace advice given to you by your health care provider. Make sure you discuss any questions you have with your health care provider. Document Revised: 12/07/2019 Document Reviewed: 12/07/2019 Elsevier Patient Education  2022 [employer].   Due to recent changes in healthcare laws, you may see the results of your imaging and laboratory studies on MyChart before your provider has had a chance to review them.  We understand that in some cases there may be results that are confusing or concerning to you. Not all laboratory results come back in the same time frame and the provider may be waiting for multiple results in order to interpret others.  Please give [place] 48 hours in order for your  provider to thoroughly review all the results before contacting the office for clarification of your results.   The [facility] providers would like to encourage you to use [facility] to communicate with providers for non-urgent requests or questions.  Due to long hold times on the telephone, sending your provider a message by [facility] may be a faster and more efficient way to get a response.  Please allow 48 business hours for a response.  Please remember that this is for non-urgent requests.   Thank you for choosing me and [facility].  Pricilla Riffle. Dagoberto Ligas., MD., Marval Regal

## 2020-10-25 ENCOUNTER — Other Ambulatory Visit: Payer: Self-pay

## 2020-10-25 ENCOUNTER — Encounter: Payer: Self-pay | Admitting: Family Medicine

## 2020-10-25 ENCOUNTER — Ambulatory Visit (INDEPENDENT_AMBULATORY_CARE_PROVIDER_SITE_OTHER): Payer: 59 | Admitting: Family Medicine

## 2020-10-25 VITALS — BP 140/74 | HR 72 | Temp 97.7°F | Ht 68.0 in | Wt 252.8 lb

## 2020-10-25 DIAGNOSIS — I10 Essential (primary) hypertension: Secondary | ICD-10-CM | POA: Diagnosis not present

## 2020-10-25 DIAGNOSIS — A040 Enteropathogenic Escherichia coli infection: Secondary | ICD-10-CM | POA: Diagnosis not present

## 2020-10-25 DIAGNOSIS — E876 Hypokalemia: Secondary | ICD-10-CM | POA: Diagnosis not present

## 2020-10-25 DIAGNOSIS — E1165 Type 2 diabetes mellitus with hyperglycemia: Secondary | ICD-10-CM | POA: Diagnosis not present

## 2020-10-25 NOTE — Progress Notes (Signed)
[facility] [address] [place] [address] Dept: [phone] Dept Fax: [phone]  Office Visit  Subjective:    Patient ID: Bryan Greet., male    DOB: [birth year], 55 y.o..   MRN: [id number]  Chief Complaint  Patient presents with   Follow-up    4 week f/u, ringing in ears.   Declines flu shot.    History of Present Illness:  Patient is in today for reassessment. [name] had a prolonged episode of not feeling well that all seemed to originate with an  enteropathogenic E coli infection in early September. This was complicated by dehydration and hypokalemia. His diarrhea has improved, but now he is having issues with ongoing belching and changes in his bowel movements. he also complains of ringing in his ears and a trimerous feeling inside his muscles (denies visible tremor). He was seen by Dr. Fuller Plan (gastroenterology) yesterday. He has not returned to regular exercise. He uses an occasional Flonase inhaler, but not regularly. He notes his home blood sugars are int he 120-160 range usually. He had noted a burning sensation in his feet that he attributed to his metoprolol. He stopped taking this about a week ago and the burning did improve.  Past Medical History: Patient Active Problem List   Diagnosis Date Noted   Tachycardia 09/20/2020   Tachyarrhythmia 09/20/2020   Ventricular tachycardia, polymorphic    Hypokalemia    Hypomagnesemia    History of colon polyps 05/08/2020   Hyperlipidemia associated with type 2 diabetes mellitus ([place]) 03/27/2020   Psoriasis 03/27/2020   Type 2 diabetes mellitus with hyperglycemia, without long-term current use of insulin ([place]) 06/12/2018   Class 2 severe obesity with serious comorbidity and body mass index (BMI) of 38.0 to 38.9 in adult [facility]) 06/12/2018   Nonischemic cardiomyopathy ([place]) 10/27/2017   Elevated coronary artery calcium score 10/14/2017   Essential  hypertension 10/14/2017   Past Surgical History:  Procedure Laterality Date   HAND SURGERY     LEFT HEART CATH AND CORONARY ANGIOGRAPHY N/A 10/29/2017   Procedure: LEFT HEART CATH AND CORONARY ANGIOGRAPHY;  Surgeon: [name], MD;  Location: [facility];  Service: Cardiovascular;  Laterality: N/A;   Family History  Adopted: Yes  Problem Relation Age of Onset   Diabetes Mother    Colon cancer Neg Hx    Colon polyps Neg Hx    Esophageal cancer Neg Hx    Rectal cancer Neg Hx    Stomach cancer Neg Hx    Outpatient Medications Prior to Visit  Medication Sig Dispense Refill   Blood Glucose Monitoring Suppl (TRUE METRIX AIR GLUCOSE METER) w/Device KIT 1 kit by Does not apply route 2 (two) times daily. 1 kit 0   clobetasol cream (TEMOVATE) 0.09 % Apply 1 application topically 2 (two) times daily. (Patient taking differently: Apply 1 application topically 2 (two) times daily as needed (for swelling, itching, or redness of affected areas).) 60 g prn   clotrimazole (LOTRIMIN) 1 % cream Apply 1 application topically 2 (two) times daily. 30 g 0   fluticasone (FLONASE) 50 MCG/ACT nasal spray Place 1 spray into both nostrils 2 (two) times daily. Reduce to once a day, after 1 month. (Patient taking differently: Place 1 spray into both nostrils 2 (two) times daily as needed for allergies or rhinitis.) 16 g 6   glucose blood (TRUE METRIX BLOOD GLUCOSE TEST) test strip Use as instructed to check blood sugars twice  daily and as needed 100 each 3   metFORMIN (GLUCOPHAGE) 1000 MG tablet TAKE 1 TABLET (1,000 MG TOTAL) BY MOUTH EVERY EVENING. (Patient taking differently: Take 1,000 mg by mouth at bedtime.) 90 tablet 1   metoprolol (TOPROL-XL) 200 MG 24 hr tablet TAKE 1 TABLET (200 MG TOTAL) BY MOUTH DAILY TAKE WITH OR IMMEDIATELY FOLLOWING A MEAL. (Patient taking differently: Take 200 mg by mouth daily after breakfast.) 30 tablet 9   pantoprazole (PROTONIX) 40 MG tablet Take 1 tablet (40 mg total) by  mouth daily. (Patient taking differently: Take 40 mg by mouth daily before breakfast.) 90 tablet 4   potassium chloride SA (KLOR-CON) 20 MEQ tablet Take 1 tablet (20 mEq total) by mouth daily. 30 tablet 2   sacubitril-valsartan (ENTRESTO) 97-103 MG TAKE 1 TABLET BY MOUTH 2 (TWO) TIMES DAILY. (Patient taking differently: Take 1 tablet by mouth in the morning and at bedtime.) 60 tablet 6   TRUEPLUS LANCETS 28G MISC Use twice daily to check blood sugars and as needed 100 each 3   rosuvastatin (CRESTOR) 20 MG tablet Take 1 tablet (20 mg total) by mouth daily. (Patient not taking: Reported on 10/25/2020) 90 tablet 0   zolpidem (AMBIEN) 5 MG tablet Take 1 tablet (5 mg total) by mouth at bedtime as needed for sleep. (Patient not taking: Reported on 10/25/2020) 15 tablet 1   No facility-administered medications prior to visit.   Allergies  Allergen Reactions   Jardiance [Empagliflozin] Other (See Comments)    Vertigo     Objective:   Today's Vitals   10/25/20 1521  BP: 140/74  Pulse: 72  Temp: 97.7 F (36.5 C)  TempSrc: Temporal  SpO2: 98%  Weight: 252 lb 12.8 oz (114.7 kg)  Height: $Remove'5\' 8"'wVbwyrx$  (1.727 m)   Body mass index is 38.44 kg/m.   General: Well developed, well nourished. No acute distress. HEENT: Normocephalic, non-traumatic. Eyes clear. External ears normal. EAC clear bilaterally   Right TM is mildly bulging. Nose  is mildly congested with scant clear rhinorrhea. Mucous   membranes moist. Oropharynx clear. Good dentition. Neck: Supple. No lymphadenopathy. No thyromegaly. Lungs: Clear to auscultation bilaterally. No wheezing, rales or rhonchi. CV: RRR without murmurs or rubs. Pulses 2+ bilaterally. Psych: Alert and oriented. Normal mood and affect.  Health Maintenance Due  Topic Date Due   FOOT EXAM  Never done   OPHTHALMOLOGY EXAM  Never done   Hepatitis C Screening  Never done   TETANUS/TDAP  Never done   Zoster Vaccines- Shingrix (1 of 2) Never done   COVID-19 Vaccine (3 -  Pfizer risk series) 12/15/2019   INFLUENZA VACCINE  Never done   Lab Results CBC Latest Ref Rng & Units 10/19/2020 10/13/2020 10/08/2020  WBC 4.0 - 10.5 K/uL 10.0 14.6(H) 15.3(H)  Hemoglobin 13.0 - 17.0 g/dL 14.2 15.1 15.3  Hematocrit 39.0 - 52.0 % 43.4 47.2 46.4  Platelets 150.0 - 400.0 K/uL 274.0 344 320   CMP Latest Ref Rng & Units 10/13/2020 10/13/2020 10/08/2020  Glucose 70 - 99 mg/dL 125(H) 123(H) 143(H)  BUN 6 - 20 mg/dL $Remove'12 15 17  'pBfUbdP$ Creatinine 0.61 - 1.24 mg/dL 1.09 1.08 1.00  Sodium 135 - 145 mmol/L 136 136 136  Potassium 3.5 - 5.1 mmol/L 4.4 5.3(H) 3.5  Chloride 98 - 111 mmol/L 101 97 103  CO2 22 - 32 mmol/L $RemoveB'23 21 23  'XjgszLYa$ Calcium 8.9 - 10.3 mg/dL 9.8 10.1 9.0  Total Protein 6.5 - 8.1 g/dL 7.7 - 8.2(H)  Total Bilirubin  0.3 - 1.2 mg/dL 0.4 - 0.4  Alkaline Phos 38 - 126 U/L 74 - 78  AST 15 - 41 U/L 20 - 17  ALT 0 - 44 U/L 35 - 24     Assessment & Plan:   1. Enteritis, enteropathogenic E. coli Treated with antibiotics. This appears to be continuing to improve, in light of the now normal WBC. GI discussed with him about simethicone use for the belching and probiotic use to try and correct the other bowel complaints. I support this and feel this is reasonable. I also encouraged [name] to keep well hydrated and to start back to exercising. This should help him to finish resolving his illness and get back to his baseline. I will reassess him in 6 weeks.  2. Hypokalemia Resolved. he can stop taking the potassium supplement.  3. Essential hypertension BP is a little high off of the metoprolol. We discussed him restarting this.  4. Type 2 diabetes mellitus with hyperglycemia, without long-term current use of insulin ([place]) As above. Recommend resuming routine exercise and continued efforts at weight loss.  Haydee Salter, MD

## 2020-10-31 ENCOUNTER — Other Ambulatory Visit: Payer: Self-pay | Admitting: Cardiology

## 2020-10-31 DIAGNOSIS — I5042 Chronic combined systolic (congestive) and diastolic (congestive) heart failure: Secondary | ICD-10-CM

## 2020-10-31 DIAGNOSIS — I10 Essential (primary) hypertension: Secondary | ICD-10-CM

## 2020-11-02 ENCOUNTER — Other Ambulatory Visit: Payer: Self-pay

## 2020-11-03 ENCOUNTER — Telehealth: Payer: Self-pay | Admitting: Family Medicine

## 2020-11-03 ENCOUNTER — Other Ambulatory Visit: Payer: Self-pay

## 2020-11-03 DIAGNOSIS — E1165 Type 2 diabetes mellitus with hyperglycemia: Secondary | ICD-10-CM

## 2020-11-03 MED ORDER — TRUE METRIX BLOOD GLUCOSE TEST VI STRP
ORAL_STRIP | 3 refills | Status: DC
Start: 2020-11-03 — End: 2023-05-26

## 2020-11-03 NOTE — Telephone Encounter (Signed)
Rs sent tot he pharmacy.  Patient notified [place] phone. Dm/cma

## 2020-11-03 NOTE — Telephone Encounter (Signed)
Pt called requesting his test strips... true metrix.

## 2020-12-04 ENCOUNTER — Other Ambulatory Visit: Payer: Self-pay

## 2020-12-05 ENCOUNTER — Other Ambulatory Visit: Payer: Self-pay

## 2020-12-15 ENCOUNTER — Ambulatory Visit: Payer: 59 | Admitting: Family Medicine

## 2021-01-01 ENCOUNTER — Encounter: Payer: Self-pay | Admitting: Family Medicine

## 2021-01-01 ENCOUNTER — Ambulatory Visit (INDEPENDENT_AMBULATORY_CARE_PROVIDER_SITE_OTHER): Payer: 59 | Admitting: Family Medicine

## 2021-01-01 ENCOUNTER — Other Ambulatory Visit: Payer: Self-pay

## 2021-01-01 VITALS — BP 136/78 | HR 75 | Temp 97.6°F | Ht 68.0 in | Wt 256.0 lb

## 2021-01-01 DIAGNOSIS — Z23 Encounter for immunization: Secondary | ICD-10-CM | POA: Diagnosis not present

## 2021-01-01 DIAGNOSIS — K219 Gastro-esophageal reflux disease without esophagitis: Secondary | ICD-10-CM

## 2021-01-01 DIAGNOSIS — E1165 Type 2 diabetes mellitus with hyperglycemia: Secondary | ICD-10-CM | POA: Diagnosis not present

## 2021-01-01 DIAGNOSIS — I10 Essential (primary) hypertension: Secondary | ICD-10-CM | POA: Diagnosis not present

## 2021-01-01 DIAGNOSIS — Z1159 Encounter for screening for other viral diseases: Secondary | ICD-10-CM

## 2021-01-01 DIAGNOSIS — I428 Other cardiomyopathies: Secondary | ICD-10-CM | POA: Diagnosis not present

## 2021-01-01 MED ORDER — PANTOPRAZOLE SODIUM 40 MG PO TBEC: 40.0000 mg | DELAYED_RELEASE_TABLET | Freq: Every day | ORAL | 3 refills | Status: DC

## 2021-01-01 NOTE — Progress Notes (Signed)
[facility] [address] [place] [address] Dept: [phone] Dept Fax: [phone]  Chronic Care Office Visit  Subjective:    Patient ID: [name], male    DOB: [birth year], 55 y.o..   MRN: [id number]  Chief Complaint  Patient presents with   Follow-up    F/u meds.   No concerns.   Declines flu shot.      History of Present Illness:  Patient is in today for reassessment of chronic medical issues.  This fall, [name] an persistent issues related to an enteropathogenic E. coli infection. These have now resolved and he is feeling better.  [name] has a history of a nonischemic cardiomyopathy. Several years ago, his ejection fraction was 25-35%. His most recent EF is now 50-55%. He is currently managed on Entresto and metoprolol. He had a heart cath in 2020 that showed a 30% stenosis of his proximal RCA. He denies nay current issues with chest pain or dyspnea.   [name] has a history of Type 2 diabetes. He is managed on metformin. He notes over the past month he has not been on his medication as he had to go to [place] for some family issues. [name] also has a history of hyperlipidemia and has been off his rosuvastatin.  [name] has a history of GERD and is managed on pantoprazole.  Past Medical History: Patient Active Problem List   Diagnosis Date Noted   Gastroesophageal reflux disease without esophagitis 01/01/2021   Tachycardia 09/20/2020   Tachyarrhythmia 09/20/2020   Ventricular tachycardia, polymorphic    Hypokalemia    Hypomagnesemia    History of colon polyps 05/08/2020   Hyperlipidemia associated with type 2 diabetes mellitus ([place]) 03/27/2020   Psoriasis 03/27/2020   Type 2 diabetes mellitus with hyperglycemia, without long-term current use of insulin ([place]) 06/12/2018   Class 2 severe obesity with serious comorbidity and body mass index (BMI) of 38.0 to 38.9 in adult [facility])  06/12/2018   Nonischemic cardiomyopathy ([place]) 10/27/2017   Elevated coronary artery calcium score 10/14/2017   Essential hypertension 10/14/2017   Past Surgical History:  Procedure Laterality Date   HAND SURGERY     LEFT HEART CATH AND CORONARY ANGIOGRAPHY N/A 10/29/2017   Procedure: LEFT HEART CATH AND CORONARY ANGIOGRAPHY;  Surgeon: [name], MD;  Location: [facility];  Service: Cardiovascular;  Laterality: N/A;   Family History  Adopted: Yes  Problem Relation Age of Onset   Diabetes Mother    Colon cancer Neg Hx    Colon polyps Neg Hx    Esophageal cancer Neg Hx    Rectal cancer Neg Hx    Stomach cancer Neg Hx    Outpatient Medications Prior to Visit  Medication Sig Dispense Refill   Blood Glucose Monitoring Suppl (TRUE METRIX AIR GLUCOSE METER) w/Device KIT 1 kit by Does not apply route 2 (two) times daily. 1 kit 0   clobetasol cream (TEMOVATE) 4.16 % Apply 1 application topically 2 (two) times daily. (Patient taking differently: Apply 1 application topically 2 (two) times daily as needed (for swelling, itching, or redness of affected areas).) 60 g prn   clotrimazole (LOTRIMIN) 1 % cream Apply 1 application topically 2 (two) times daily. 30 g 0   ENTRESTO 97-103 MG TAKE 1 TABLET BY MOUTH TWICE A DAY 60 tablet 6   fluticasone (FLONASE) 50 MCG/ACT nasal spray Place 1 spray into both nostrils 2 (two) times daily. Reduce  to once a day, after 1 month. (Patient taking differently: Place 1 spray into both nostrils 2 (two) times daily as needed for allergies or rhinitis.) 16 g 6   glucose blood (TRUE METRIX BLOOD GLUCOSE TEST) test strip Use as instructed to check blood sugars twice daily and as needed 100 each 3   metFORMIN (GLUCOPHAGE) 1000 MG tablet TAKE 1 TABLET (1,000 MG TOTAL) BY MOUTH EVERY EVENING. (Patient taking differently: Take 1,000 mg by mouth at bedtime.) 90 tablet 1   metoprolol (TOPROL-XL) 200 MG 24 hr tablet TAKE 1 TABLET (200 MG TOTAL) BY MOUTH DAILY TAKE  WITH OR IMMEDIATELY FOLLOWING A MEAL. (Patient taking differently: Take 200 mg by mouth daily after breakfast.) 30 tablet 9   potassium chloride SA (KLOR-CON) 20 MEQ tablet Take 1 tablet (20 mEq total) by mouth daily. 30 tablet 2   TRUEPLUS LANCETS 28G MISC Use twice daily to check blood sugars and as needed 100 each 3   zolpidem (AMBIEN) 5 MG tablet Take 1 tablet (5 mg total) by mouth at bedtime as needed for sleep. 15 tablet 1   pantoprazole (PROTONIX) 40 MG tablet Take 1 tablet (40 mg total) by mouth daily. (Patient taking differently: Take 40 mg by mouth daily before breakfast.) 90 tablet 4   rosuvastatin (CRESTOR) 20 MG tablet Take 1 tablet (20 mg total) by mouth daily. (Patient not taking: Reported on 10/25/2020) 90 tablet 0   No facility-administered medications prior to visit.   Allergies  Allergen Reactions   Jardiance [Empagliflozin] Other (See Comments)    Vertigo      Objective:   Today's Vitals   01/01/21 1456  BP: 136/78  Pulse: 75  Temp: 97.6 F (36.4 C)  TempSrc: Temporal  SpO2: 95%  Weight: 256 lb (116.1 kg)  Height: $Remove'5\' 8"'FWoQmhR$  (1.727 m)   Body mass index is 38.92 kg/m.   General: Well developed, well nourished. No acute distress. Feet: Skin intact. DP and PT pulses 2+. A few nails are mildly dystrophic and thickened. 5.07   monofilament testing normal. Psych: Alert and oriented. Normal mood and affect.  Health Maintenance Due  Topic Date Due   Pneumococcal Vaccine 9-47 Years old (1 - PCV) Never done   Hepatitis C Screening  Never done   TETANUS/TDAP  Never done   Zoster Vaccines- Shingrix (1 of 2) Never done   COVID-19 Vaccine (3 - Pfizer risk series) 12/15/2019   INFLUENZA VACCINE  Never done   Lab Results Last lipids Lab Results  Component Value Date   CHOL 217 (H) 09/05/2020   HDL 29.90 (L) 09/05/2020   LDLCALC 156 (H) 09/05/2020   TRIG 154.0 (H) 09/05/2020   CHOLHDL 7 09/05/2020   Last hemoglobin A1c Lab Results  Component Value Date    HGBA1C 6.8 (H) 09/05/2020     Assessment & Plan:   1. Type 2 diabetes mellitus with hyperglycemia, without long-term current use of insulin (HCC) Due for quarterly labs. These may be off due to being without his medication. Recommend he restart his metformin.  - Glucose, random - Hemoglobin A1c  2. Essential hypertension BP is acceptable. Continue metoprolol and Entresto.  3. Nonischemic cardiomyopathy ([place]) Compensated. Continue metoprolol and Entresto.  4. Gastroesophageal reflux disease without esophagitis Stable on Protonix.  - pantoprazole (PROTONIX) 40 MG tablet; Take 1 tablet (40 mg total) by mouth daily before breakfast.  Dispense: 90 tablet; Refill: 3  5. Encounter for hepatitis C screening test for low risk patient  - HCV  Ab w Reflex to Quant PCR  Haydee Salter, MD

## 2021-01-02 ENCOUNTER — Other Ambulatory Visit: Payer: Self-pay

## 2021-01-02 LAB — HCV INTERPRETATION

## 2021-01-02 LAB — HEMOGLOBIN A1C: Hgb A1c MFr Bld: 7 % — ABNORMAL HIGH (ref 4.6–6.5)

## 2021-01-02 LAB — GLUCOSE, RANDOM: Glucose, Bld: 127 mg/dL — ABNORMAL HIGH (ref 70–99)

## 2021-01-02 LAB — HCV AB W REFLEX TO QUANT PCR: HCV Ab: 0.1 s/co ratio (ref 0.0–0.9)

## 2021-01-03 ENCOUNTER — Other Ambulatory Visit: Payer: Self-pay

## 2021-01-04 ENCOUNTER — Other Ambulatory Visit: Payer: Self-pay

## 2021-02-01 DIAGNOSIS — I7789 Other specified disorders of arteries and arterioles: Secondary | ICD-10-CM | POA: Insufficient documentation

## 2021-02-01 DIAGNOSIS — E785 Hyperlipidemia, unspecified: Secondary | ICD-10-CM | POA: Insufficient documentation

## 2021-02-01 NOTE — Progress Notes (Signed)
Cardiology Office Note   Date:  02/02/2021   ID:  Bryan Greet., DOB September 29, 1965, MRN [id number]  PCP:  Haydee Salter, MD  Cardiologist:   [name], MD   Chief Complaint  Patient presents with   Cardiomyopathy       History of Present Illness: Bryan English. is a 56 y.o. male who was referred by himself for evaluation of chest pain.    He had had multiple ED visits for chest pain. He was not found to have any evidence for ischemia.  In a previous visit in August he had had negative CT for PE.  He did have coronary calcification in the LAD.   When I saw him I sent him for an echo.  He was found to have an EF of 20%.  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  We have titrated his meds.    His EF in Jan was slightly better with at 25 - 30%.  He has had bradycardia.   With med titration his EF has improved and was 50% in March 2022.  He was admitted to the hospital on 09/20/2020 after presenting to [facility] with complaint of irregular heartbeat.  While in the ER, his heart rate was noted to 200 bpm .  He was hypokalemic.    He has not been in the ED since Sept.        Since he was last seen he has done well.  I reviewed all of these records and it turns out he was eventually diagnosed with E. coli.  He said he has had his potassium followed by his primary provider and he has been supplemented and has had no arrhythmias. The patient denies any new symptoms such as chest discomfort, neck or arm discomfort. There has been no new shortness of breath, PND or orthopnea. There have been no reported palpitations, presyncope or syncope.    Past Medical History:  Diagnosis Date   Diabetes mellitus without complication (HCC)    GERD (gastroesophageal reflux disease)    Hyperlipidemia    Hypertension    NICM (nonischemic cardiomyopathy) ([place]) 10/29/2017   EF 20% by echo, minimal CAD at cath    Past Surgical History:  Procedure Laterality Date    HAND SURGERY     LEFT HEART CATH AND CORONARY ANGIOGRAPHY N/A 10/29/2017   Procedure: LEFT HEART CATH AND CORONARY ANGIOGRAPHY;  Surgeon: [name], MD;  Location: [facility];  Service: Cardiovascular;  Laterality: N/A;     Current Outpatient Medications  Medication Sig Dispense Refill   Blood Glucose Monitoring Suppl (TRUE METRIX AIR GLUCOSE METER) w/Device KIT 1 kit by Does not apply route 2 (two) times daily. 1 kit 0   clobetasol cream (TEMOVATE) 7.34 % Apply 1 application topically 2 (two) times daily. (Patient taking differently: Apply 1 application topically 2 (two) times daily as needed (for swelling, itching, or redness of affected areas).) 60 g prn   clotrimazole (LOTRIMIN) 1 % cream Apply 1 application topically 2 (two) times daily. 30 g 0   ENTRESTO 97-103 MG TAKE 1 TABLET BY MOUTH TWICE A DAY 60 tablet 6   glucose blood (TRUE METRIX BLOOD GLUCOSE TEST) test strip Use as instructed to check blood sugars twice daily and as needed 100 each 3   metFORMIN (GLUCOPHAGE) 1000 MG tablet TAKE 1 TABLET (1,000 MG TOTAL) BY MOUTH EVERY EVENING. (Patient taking differently: Take 1,000  mg by mouth at bedtime.) 90 tablet 1   metoprolol (TOPROL-XL) 200 MG 24 hr tablet TAKE 1 TABLET (200 MG TOTAL) BY MOUTH DAILY TAKE WITH OR IMMEDIATELY FOLLOWING A MEAL. (Patient taking differently: Take 200 mg by mouth daily after breakfast.) 30 tablet 9   pantoprazole (PROTONIX) 40 MG tablet Take 1 tablet (40 mg total) by mouth daily before breakfast. 90 tablet 3   potassium chloride SA (KLOR-CON) 20 MEQ tablet Take 1 tablet (20 mEq total) by mouth daily. 30 tablet 2   rosuvastatin (CRESTOR) 20 MG tablet Take 1 tablet (20 mg total) by mouth daily. 90 tablet 0   TRUEPLUS LANCETS 28G MISC Use twice daily to check blood sugars and as needed 100 each 3   zolpidem (AMBIEN) 5 MG tablet Take 1 tablet (5 mg total) by mouth at bedtime as needed for sleep. 15 tablet 1   fluticasone (FLONASE) 50 MCG/ACT nasal  spray Place 1 spray into both nostrils 2 (two) times daily. Reduce to once a day, after 1 month. (Patient taking differently: Place 1 spray into both nostrils 2 (two) times daily as needed for allergies or rhinitis.) 16 g 6   No current facility-administered medications for this visit.    Allergies:   Jardiance [empagliflozin]    ROS:  Please see the history of present illness.   Otherwise, review of systems are positive for none.   All other systems are reviewed and negative.    PHYSICAL EXAM: VS:  BP 132/80    Pulse 69    Resp (!) 95    Ht 5' 8.75" (1.746 m)    Wt 257 lb 9.6 oz (116.8 kg)    BMI 38.32 kg/m  , BMI Body mass index is 38.32 kg/m. GENERAL:  Well appearing NECK:  No jugular venous distention, waveform within normal limits, carotid upstroke brisk and symmetric, no bruits, no thyromegaly LUNGS:  Clear to auscultation bilaterally CHEST:  Unremarkable HEART:  PMI not displaced or sustained,S1 and S2 within normal limits, no S3, no S4, no clicks, no rubs, no murmurs ABD:  Flat, positive bowel sounds normal in frequency in pitch, no bruits, no rebound, no guarding, no midline pulsatile mass, no hepatomegaly, no splenomegaly EXT:  2 plus pulses throughout, no edema, no cyanosis no clubbing   EKG:  EKG is not ordered today.   Recent Labs: 04/27/2020: TSH 2.392 09/27/2020: Magnesium 1.6 10/13/2020: ALT 35; BUN 12; Creatinine, Ser 1.09; Potassium 4.4; Sodium 136 10/19/2020: Hemoglobin 14.2; Platelets 274.0    Lipid Panel    Component Value Date/Time   CHOL 217 (H) 09/05/2020 1211   CHOL 218 (H) 06/12/2018 1456   TRIG 154.0 (H) 09/05/2020 1211   HDL 29.90 (L) 09/05/2020 1211   HDL 31 (L) 06/12/2018 1456   CHOLHDL 7 09/05/2020 1211   VLDL 30.8 09/05/2020 1211   LDLCALC 156 (H) 09/05/2020 1211   LDLCALC 153 (H) 06/12/2018 1456      Wt Readings from Last 3 Encounters:  02/02/21 257 lb 9.6 oz (116.8 kg)  01/01/21 256 lb (116.1 kg)  10/25/20 252 lb 12.8 oz (114.7 kg)       Other studies Reviewed: Additional studies/ records that were reviewed today include: Hospital records Review of the above records demonstrates:     ASSESSMENT AND PLAN:   CARDIOMYOPATHY:    EF was 55% in March 2022.   No change in therapy.  I think he has been compliant with his medications.  HTN:    At target.  No change in therapy.  DM: A1c was he said he was fall follow-up he is diabetes medicines but he is just restarted them.  Her last A1c was 7.0.  No change in therapy.    BRADYCARDIA:   He has had no symptoms related to this.  No change in therapy.  DYSLIPIDEMIA: His LDL was 156 but he says he was not taking his medications.  He said he just restarted his statins.  I am going to put him down for 10-week lipid profile.   OBESITY: He has had a lot of travel a lot of family stress going to be back on diet for weight loss.   AORTIC ENLARGEMENT:  His aorta is 43 mm.  I will consider follow-up CT in a year.   Current medicines are reviewed at length with the patient today.  The patient does not have concerns regarding medicines.  The following changes have been made:   As above none  Labs/ tests ordered today include:    As above  Orders Placed This Encounter  Procedures   Lipid panel    Disposition:   FU with Almyra Deforest in 6 months.    Signed, [name], MD  02/02/2021 10:52 AM    [facility]

## 2021-02-02 ENCOUNTER — Other Ambulatory Visit: Payer: Self-pay

## 2021-02-02 ENCOUNTER — Encounter: Payer: Self-pay | Admitting: Cardiology

## 2021-02-02 ENCOUNTER — Ambulatory Visit (INDEPENDENT_AMBULATORY_CARE_PROVIDER_SITE_OTHER): Payer: 59 | Admitting: Cardiology

## 2021-02-02 VITALS — BP 132/80 | HR 69 | Resp 95 | Ht 68.75 in | Wt 257.6 lb

## 2021-02-02 DIAGNOSIS — E118 Type 2 diabetes mellitus with unspecified complications: Secondary | ICD-10-CM | POA: Diagnosis not present

## 2021-02-02 DIAGNOSIS — R001 Bradycardia, unspecified: Secondary | ICD-10-CM

## 2021-02-02 DIAGNOSIS — I10 Essential (primary) hypertension: Secondary | ICD-10-CM | POA: Diagnosis not present

## 2021-02-02 DIAGNOSIS — I420 Dilated cardiomyopathy: Secondary | ICD-10-CM

## 2021-02-02 DIAGNOSIS — I7789 Other specified disorders of arteries and arterioles: Secondary | ICD-10-CM

## 2021-02-02 DIAGNOSIS — E785 Hyperlipidemia, unspecified: Secondary | ICD-10-CM

## 2021-02-02 NOTE — Patient Instructions (Signed)
Medication Instructions:  Your physician recommends that you continue on your current medications as directed. Please refer to the Current Medication list given to you today.  *If you need a refill on your cardiac medications before your next appointment, please call your pharmacy*   Lab Work: Your physician recommends that you return for lab work in: 10 weeks for FASTING lipid panel (around 04/13/21)  If you have labs (blood work) drawn today and your tests are completely normal, you will receive your results only by: [place] (if you have MyChart) OR A paper copy in the mail If you have any lab test that is abnormal or we need to change your treatment, we will call you to review the results.   Follow-Up: At [facility], you and your health needs are our priority.  As part of our continuing mission to provide you with exceptional heart care, we have created designated Provider Care Teams.  These Care Teams include your primary Cardiologist (physician) and Advanced Practice Providers (APPs -  Physician Assistants and Nurse Practitioners) who all work together to provide you with the care you need, when you need it.  We recommend signing up for the patient portal called "MyChart".  Sign up information is provided on this After Visit Summary.  MyChart is used to connect with patients for Virtual Visits (Telemedicine).  Patients are able to view lab/test results, encounter notes, upcoming appointments, etc.  Non-urgent messages can be sent to your provider as well.   To learn more about what you can do with MyChart, go to [email or web address].    Your next appointment:   6 month(s)  The format for your next appointment:   In Person  Provider:   Almyra Deforest, PA-C

## 2021-02-26 ENCOUNTER — Other Ambulatory Visit: Payer: Self-pay | Admitting: Family Medicine

## 2021-02-26 DIAGNOSIS — K219 Gastro-esophageal reflux disease without esophagitis: Secondary | ICD-10-CM

## 2021-03-02 ENCOUNTER — Other Ambulatory Visit: Payer: Self-pay | Admitting: Family Medicine

## 2021-03-02 DIAGNOSIS — E1165 Type 2 diabetes mellitus with hyperglycemia: Secondary | ICD-10-CM

## 2021-03-03 ENCOUNTER — Other Ambulatory Visit: Payer: Self-pay | Admitting: Cardiology

## 2021-03-06 ENCOUNTER — Other Ambulatory Visit: Payer: Self-pay

## 2021-03-06 ENCOUNTER — Other Ambulatory Visit: Payer: Self-pay | Admitting: Family Medicine

## 2021-03-06 ENCOUNTER — Other Ambulatory Visit: Payer: Self-pay | Admitting: Cardiology

## 2021-03-06 DIAGNOSIS — K219 Gastro-esophageal reflux disease without esophagitis: Secondary | ICD-10-CM

## 2021-03-06 MED ORDER — METOPROLOL SUCCINATE ER 200 MG PO TB24
ORAL_TABLET | ORAL | 9 refills | Status: DC
  Filled 2021-03-06: qty 30, 30d supply, fill #0

## 2021-03-07 ENCOUNTER — Other Ambulatory Visit: Payer: Self-pay

## 2021-03-30 DIAGNOSIS — I639 Cerebral infarction, unspecified: Secondary | ICD-10-CM | POA: Insufficient documentation

## 2021-03-30 DIAGNOSIS — I509 Heart failure, unspecified: Secondary | ICD-10-CM | POA: Insufficient documentation

## 2021-04-02 ENCOUNTER — Other Ambulatory Visit: Payer: Self-pay

## 2021-04-02 ENCOUNTER — Ambulatory Visit (INDEPENDENT_AMBULATORY_CARE_PROVIDER_SITE_OTHER): Payer: 59 | Admitting: Family Medicine

## 2021-04-02 ENCOUNTER — Encounter: Payer: Self-pay | Admitting: Family Medicine

## 2021-04-02 VITALS — BP 124/82 | HR 82 | Temp 97.4°F | Ht 68.75 in | Wt 254.0 lb

## 2021-04-02 DIAGNOSIS — I428 Other cardiomyopathies: Secondary | ICD-10-CM | POA: Diagnosis not present

## 2021-04-02 DIAGNOSIS — I10 Essential (primary) hypertension: Secondary | ICD-10-CM

## 2021-04-02 DIAGNOSIS — R42 Dizziness and giddiness: Secondary | ICD-10-CM

## 2021-04-02 DIAGNOSIS — E1165 Type 2 diabetes mellitus with hyperglycemia: Secondary | ICD-10-CM | POA: Diagnosis not present

## 2021-04-02 DIAGNOSIS — R000 Tachycardia, unspecified: Secondary | ICD-10-CM

## 2021-04-02 MED ORDER — METOPROLOL SUCCINATE ER 100 MG PO TB24: 100.0000 mg | ORAL_TABLET | Freq: Every day | ORAL | 9 refills | Status: DC

## 2021-04-02 NOTE — Progress Notes (Signed)
?[facility] ?[facility] ?[place] ?[place] [address] ?Dept: [phone] ?Dept Fax: [phone] ? ?Chronic Care Office Visit ? ?Subjective:  ? ? Patient ID: Bryan Greet., male    DOB: [birth year], 56 y.o..   MRN: [id number] ? ?Chief Complaint  ?Patient presents with  ? Follow-up  ?  3 month f/u.  C/o having some light headedness and went to ER while in [place].    ? ? ?History of Present Illness: ? ?Patient is in today for reassessment of chronic medical issues. ? ?[name] notes he had a recent issue with lightheadedness that persisted for about a week. This started while he was in [place], but worsened after traveling up to [place]. After nearly passing out one night, he went to the ER. He notes he underwent multiplet test, including a CT of head, an MRI scan of the brain, and an echocardiogram. No significant underlying issue was found. He has still been noting a bit of this. ? ?[name] a history of a nonischemic cardiomyopathy. Several years ago, his ejection fraction was 25-35%. His last EF by the local cardiolgoist was 50-55%. He is currently managed on Entresto and metoprolol. He had a heart cath in 2020 that showed a 30% stenosis of his proximal RCA. He denies any current issues with chest pain or dyspnea. The EF in [place] was 53%. ?  ?[name] a history of Type 2 diabetes. He is managed on metformin. He notes he has had some GI intolerance, so had been off of his meds some. His A1c in [place] was at 6.9%.  ? ?[name] also has a history of hyperlipidemia and is managed on rosuvastatin. ?  ?[name] a history of GERD and is managed on pantoprazole. ? ?Past Medical History: ?Patient Active Problem List  ? Diagnosis Date Noted  ? Aortic root enlargement ([place]) 02/01/2021  ? Hyperlipidemia 02/01/2021  ? Gastroesophageal reflux disease without esophagitis 01/01/2021  ? Tachyarrhythmia 09/20/2020  ? Ventricular tachycardia,  polymorphic   ? History of colon polyps 05/08/2020  ? Psoriasis 03/27/2020  ? Type 2 diabetes mellitus with hyperglycemia, without long-term current use of insulin ([place]) 06/12/2018  ? Class 2 severe obesity with serious comorbidity and body mass index (BMI) of 38.0 to 38.9 in adult [facility]) 06/12/2018  ? Nonischemic cardiomyopathy ([place]) 10/27/2017  ? Elevated coronary artery calcium score 10/14/2017  ? Essential hypertension 10/14/2017  ? ?Past Surgical History:  ?Procedure Laterality Date  ? HAND SURGERY    ? LEFT HEART CATH AND CORONARY ANGIOGRAPHY N/A 10/29/2017  ? Procedure: LEFT HEART CATH AND CORONARY ANGIOGRAPHY;  Surgeon: [name], MD;  Location: [facility];  Service: Cardiovascular;  Laterality: N/A;  ? ?Family History  ?Adopted: Yes  ?Problem Relation Age of Onset  ? Diabetes Mother   ? Colon cancer Neg Hx   ? Colon polyps Neg Hx   ? Esophageal cancer Neg Hx   ? Rectal cancer Neg Hx   ? Stomach cancer Neg Hx   ? ?Outpatient Medications Prior to Visit  ?Medication Sig Dispense Refill  ? Blood Glucose Monitoring Suppl (TRUE METRIX AIR GLUCOSE METER) w/Device KIT 1 kit by Does not apply route 2 (two) times daily. 1 kit 0  ? clobetasol cream (TEMOVATE) 9.74 % Apply 1 application topically 2 (two) times daily. (Patient taking differently: Apply 1 application. topically 2 (two) times daily as needed (for swelling, itching, or redness of affected areas).) 60  g prn  ? ENTRESTO 97-103 MG TAKE 1 TABLET BY MOUTH TWICE A DAY 60 tablet 6  ? fluticasone (FLONASE) 50 MCG/ACT nasal spray Place 1 spray into both nostrils 2 (two) times daily. Reduce to once a day, after 1 month. (Patient taking differently: Place 1 spray into both nostrils 2 (two) times daily as needed for allergies or rhinitis.) 16 g 6  ? glucose blood (TRUE METRIX BLOOD GLUCOSE TEST) test strip Use as instructed to check blood sugars twice daily and as needed 100 each 3  ? KLOR-CON M20 20 MEQ tablet TAKE 1 TABLET BY MOUTH EVERY DAY 30 tablet 2   ? pantoprazole (PROTONIX) 40 MG tablet Take 1 tablet (40 mg total) by mouth 2 (two) times daily. 180 tablet 3  ? rosuvastatin (CRESTOR) 20 MG tablet Take 1 tablet (20 mg total) by mouth daily. 90 tablet 0  ? TRUEPLUS LANCETS 28G MISC Use twice daily to check blood sugars and as needed 100 each 3  ? zolpidem (AMBIEN) 5 MG tablet Take 1 tablet (5 mg total) by mouth at bedtime as needed for sleep. 15 tablet 1  ? metoprolol (TOPROL-XL) 200 MG 24 hr tablet TAKE 1 TABLET (200 MG TOTAL) BY MOUTH DAILY TAKE WITH OR IMMEDIATELY FOLLOWING A MEAL. 30 tablet 9  ? clotrimazole (LOTRIMIN) 1 % cream Apply 1 application topically 2 (two) times daily. (Patient not taking: Reported on 04/02/2021) 30 g 0  ? metFORMIN (GLUCOPHAGE) 1000 MG tablet Take 1 tablet (1,000 mg total) by mouth at bedtime. (Patient not taking: Reported on 04/02/2021) 90 tablet 3  ? ?No facility-administered medications prior to visit.  ? ?Allergies  ?Allergen Reactions  ? Jardiance [Empagliflozin] Other (See Comments)  ?  Vertigo ?  ?  ?Objective:  ? ?Today's Vitals  ? 04/02/21 1300  ?BP: 124/82  ?Pulse: 82  ?Temp: (!) 97.4 ?F (36.3 ?C)  ?TempSrc: Temporal  ?SpO2: 97%  ?Weight: 254 lb (115.2 kg)  ?Height: 5' 8.75" (1.746 m)  ? ?Body mass index is 37.78 kg/m?.  ? ?General: Well developed, well nourished. No acute distress. ?Psych: Alert and oriented. Normal mood and affect. ? ?Health Maintenance Due  ?Topic Date Due  ? Zoster Vaccines- Shingrix (1 of 2) Never done  ?   ?Assessment & Plan:  ? ?1. Lightheadedness ?I reviewed labs, CT and MRI report and echocardiogram report from [place] (on patient's My Chart). I recommend we step his beta-blocker dose down to 100 mg daily and see if the lightheadedness improves. I will plan to check him back in 1 month. ? ?- metoprolol (TOPROL-XL) 100 MG 24 hr tablet; Take 1 tablet (100 mg total) by mouth daily.  Dispense: 30 tablet; Refill: 9 ? ?2. Type 2 diabetes mellitus with hyperglycemia, without long-term current use of insulin  ([place]) ?We discussed options for DM management. [name] would prefer to go back on metformin for 1 month and see if he tolerates this before making a switch to other diabetes medications. ? ?3. Nonischemic cardiomyopathy ([place]) ?Compensated and stable. ? ?- metoprolol (TOPROL-XL) 100 MG 24 hr tablet; Take 1 tablet (100 mg total) by mouth daily.  Dispense: 30 tablet; Refill: 9 ? ?4. Essential hypertension ?Blood pressure at goal. ? ?5. Tachyarrhythmia ?Has been well managed with metoprolol. I will cautiously lower his beta blocker dose and assess for any tachyarrhythmia at his next visit. ? ?- metoprolol (TOPROL-XL) 100 MG 24 hr tablet; Take 1 tablet (100 mg total) by mouth daily.  Dispense: 30 tablet;  Refill: 9 ? ? ?Return in about 1 month (around 05/03/2021) for Reassessment.  ? ?Haydee Salter, MD ?

## 2021-04-03 ENCOUNTER — Encounter: Payer: Self-pay | Admitting: Family Medicine

## 2021-04-03 DIAGNOSIS — R42 Dizziness and giddiness: Secondary | ICD-10-CM

## 2021-04-03 DIAGNOSIS — I6502 Occlusion and stenosis of left vertebral artery: Secondary | ICD-10-CM

## 2021-05-08 ENCOUNTER — Other Ambulatory Visit: Payer: Self-pay

## 2021-05-08 DIAGNOSIS — I6529 Occlusion and stenosis of unspecified carotid artery: Secondary | ICD-10-CM

## 2021-05-10 ENCOUNTER — Ambulatory Visit (HOSPITAL_COMMUNITY)
Admission: RE | Admit: 2021-05-10 | Discharge: 2021-05-10 | Disposition: A | Discharge status: Home / Self Care | Payer: 59 | Source: Ambulatory Visit | Attending: Vascular Surgery | Admitting: Vascular Surgery

## 2021-05-10 DIAGNOSIS — I6529 Occlusion and stenosis of unspecified carotid artery: Secondary | ICD-10-CM | POA: Diagnosis present

## 2021-05-10 DIAGNOSIS — I6502 Occlusion and stenosis of left vertebral artery: Secondary | ICD-10-CM | POA: Insufficient documentation

## 2021-05-22 ENCOUNTER — Encounter: Payer: Self-pay | Admitting: Vascular Surgery

## 2021-05-22 ENCOUNTER — Ambulatory Visit: Payer: 59 | Admitting: Vascular Surgery

## 2021-05-22 DIAGNOSIS — I6502 Occlusion and stenosis of left vertebral artery: Secondary | ICD-10-CM

## 2021-05-22 DIAGNOSIS — I6509 Occlusion and stenosis of unspecified vertebral artery: Secondary | ICD-10-CM | POA: Insufficient documentation

## 2021-05-22 NOTE — Progress Notes (Signed)
? ? ?Patient name: Bryan English. MRN: [id number] DOB: 1965/11/29 Sex: male ? ?REASON FOR CONSULT: Evaluate left vertebral artery stenosis ? ?HPI: ?Bryan English. is a 56 y.o. male, with history of hypertension, hyperlipidemia, diabetes, nonischemic cardiomyopathy that presents for evaluation of left vertebral artery stenosis.  Patient was seen in the emergency room in [place] when he had some dizziness several months ago.  He states he had a number of tests while there including some imaging that showed a left vertebral artery narrowing.  I do not have the report or a copy of the images.  He states ultimately he thinks his dizziness was related to medications and his dizziness has resolved about 4 weeks ago.  He denies any history of TIAs or strokes.  He is on aspirin. ? ?Past Medical History:  ?Diagnosis Date  ? Diabetes mellitus without complication ([place])   ? GERD (gastroesophageal reflux disease)   ? Hyperlipidemia   ? Hypertension   ? NICM (nonischemic cardiomyopathy) ([place]) 10/29/2017  ? EF 20% by echo, minimal CAD at cath  ? ? ?Past Surgical History:  ?Procedure Laterality Date  ? HAND SURGERY    ? LEFT HEART CATH AND CORONARY ANGIOGRAPHY N/A 10/29/2017  ? Procedure: LEFT HEART CATH AND CORONARY ANGIOGRAPHY;  Surgeon: [name], MD;  Location: [facility];  Service: Cardiovascular;  Laterality: N/A;  ? ? ?Family History  ?Adopted: Yes  ?Problem Relation Age of Onset  ? Diabetes Mother   ? Colon cancer Neg Hx   ? Colon polyps Neg Hx   ? Esophageal cancer Neg Hx   ? Rectal cancer Neg Hx   ? Stomach cancer Neg Hx   ? ? ?SOCIAL HISTORY: ?Social History  ? ?Socioeconomic History  ? Marital status: Married  ?  Spouse name: Not on file  ? Number of children: Not on file  ? Years of education: Not on file  ? Highest education level: Not on file  ?Occupational History  ? Not on file  ?Tobacco Use  ? Smoking status: Never  ? Smokeless tobacco: Never  ?Vaping Use  ? Vaping Use: Never  used  ?Substance and Sexual Activity  ? Alcohol use: Not Currently  ? Drug use: Never  ? Sexual activity: Yes  ?Other Topics Concern  ? Not on file  ?Social History Narrative  ? Married.  Lives with wife.  Two children natural.     ? ?Social Determinants of Health  ? ?Financial Resource Strain: Not on file  ?Food Insecurity: Not on file  ?Transportation Needs: Not on file  ?Physical Activity: Not on file  ?Stress: Not on file  ?Social Connections: Not on file  ?Intimate Partner Violence: Not on file  ? ? ?Allergies  ?Allergen Reactions  ? Jardiance [Empagliflozin] Other (See Comments)  ?  Vertigo ?  ? ? ?Current Outpatient Medications  ?Medication Sig Dispense Refill  ? Blood Glucose Monitoring Suppl (TRUE METRIX AIR GLUCOSE METER) w/Device KIT 1 kit by Does not apply route 2 (two) times daily. 1 kit 0  ? clobetasol cream (TEMOVATE) 4.09 % Apply 1 application topically 2 (two) times daily. (Patient taking differently: Apply 1 application. topically 2 (two) times daily as needed (for swelling, itching, or redness of affected areas).) 60 g prn  ? ENTRESTO 97-103 MG TAKE 1 TABLET BY MOUTH TWICE A DAY 60 tablet 6  ? glucose blood (TRUE METRIX BLOOD GLUCOSE TEST) test strip Use as instructed to check blood sugars twice  daily and as needed 100 each 3  ? KLOR-CON M20 20 MEQ tablet TAKE 1 TABLET BY MOUTH EVERY DAY 30 tablet 2  ? metFORMIN (GLUCOPHAGE) 1000 MG tablet Take 1 tablet (1,000 mg total) by mouth at bedtime. 90 tablet 3  ? metoprolol (TOPROL-XL) 100 MG 24 hr tablet Take 1 tablet (100 mg total) by mouth daily. 30 tablet 9  ? pantoprazole (PROTONIX) 40 MG tablet Take 1 tablet (40 mg total) by mouth 2 (two) times daily. 180 tablet 3  ? rosuvastatin (CRESTOR) 20 MG tablet Take 1 tablet (20 mg total) by mouth daily. 90 tablet 0  ? TRUEPLUS LANCETS 28G MISC Use twice daily to check blood sugars and as needed 100 each 3  ? zolpidem (AMBIEN) 5 MG tablet Take 1 tablet (5 mg total) by mouth at bedtime as needed for sleep. 15  tablet 1  ? fluticasone (FLONASE) 50 MCG/ACT nasal spray Place 1 spray into both nostrils 2 (two) times daily. Reduce to once a day, after 1 month. (Patient taking differently: Place 1 spray into both nostrils 2 (two) times daily as needed for allergies or rhinitis.) 16 g 6  ? ?No current facility-administered medications for this visit.  ? ? ?REVIEW OF SYSTEMS:  ?$RemoveBe'[X]'enUZiyJtT$  denotes positive finding, $RemoveBeforeDEI'[ ]'nRsZrAdCMMSxTCcd$  denotes negative finding ?Cardiac  Comments:  ?Chest pain or chest pressure:    ?Shortness of breath upon exertion:    ?Short of breath when lying flat:    ?Irregular heart rhythm:    ?    ?Vascular    ?Pain in calf, thigh, or hip brought on by ambulation:    ?Pain in feet at night that wakes you up from your sleep:     ?Blood clot in your veins:    ?Leg swelling:     ?    ?Pulmonary    ?Oxygen at home:    ?Productive cough:     ?Wheezing:     ?    ?Neurologic    ?Sudden weakness in arms or legs:     ?Sudden numbness in arms or legs:     ?Sudden onset of difficulty speaking or slurred speech:    ?Temporary loss of vision in one eye:     ?Problems with dizziness:     ?    ?Gastrointestinal    ?Blood in stool:     ?Vomited blood:     ?    ?Genitourinary    ?Burning when urinating:     ?Blood in urine:    ?    ?Psychiatric    ?Major depression:     ?    ?Hematologic    ?Bleeding problems:    ?Problems with blood clotting too easily:    ?    ?Skin    ?Rashes or ulcers:    ?    ?Constitutional    ?Fever or chills:    ? ? ?PHYSICAL EXAM: ?Vitals:  ? 05/22/21 1419  ?BP: (!) 144/85  ?Pulse: 79  ?Resp: 18  ?Temp: 97.9 ?F (36.6 ?C)  ?TempSrc: Temporal  ?SpO2: 96%  ?Weight: 255 lb (115.7 kg)  ?Height: $RemoveB'5\' 9"'VaYodzVT$  (1.753 m)  ? ? ?GENERAL: The patient is a well-nourished male, in no acute distress. The vital signs are documented above. ?CARDIAC: There is a regular rate and rhythm.  ?VASCULAR:  ?Palpable radial pulses bilaterally ?Palpable DP pulses bilaterally ?PULMONARY: No respiratory distress ?ABDOMEN: Soft and  non-tender. ?MUSCULOSKELETAL: There are no major deformities or cyanosis. ?NEUROLOGIC: No focal weakness  or paresthesias are detected. ?SKIN: There are no ulcers or rashes noted. ?PSYCHIATRIC: The patient has a normal affect. ? ?DATA:  ? ?Carotid duplex 05/10/2021 shows no evidence of carotid stenosis and antegrade flow in both vertebral arteries. ? ?Assessment/Plan: ? ?56 year old male that presents for evaluation of suspected left vertebral artery stenosis identified on imaging from [place] when he was evaluated in the ED with dizziness several months ago.  I do not have any imaging or copies of the images.  His dizziness completely resolved once he adjusted his medicines about 4 weeks ago and he thinks this may have been related to the metoprolol or pantoprazole he was taking.  I discussed his ultrasound done here yesterday shows no evidence of significant carotid disease and normal antegrade flow in both vertebral arteries.  I do not really feel strongly about pursuing more invasive work-up given he has had symptom resolution.  Discussed the importance of aspirin for overall stroke risk reduction.  Discussed if he has any recurrent symptoms I'd be happy to evaluate him again. ? ? ?Marty Heck, MD ?[facility] ?Office: [phone] ? ? ? ? ?

## 2021-05-29 ENCOUNTER — Other Ambulatory Visit: Payer: Self-pay | Admitting: Cardiology

## 2021-05-29 DIAGNOSIS — I5042 Chronic combined systolic (congestive) and diastolic (congestive) heart failure: Secondary | ICD-10-CM

## 2021-05-29 DIAGNOSIS — I10 Essential (primary) hypertension: Secondary | ICD-10-CM

## 2021-06-27 ENCOUNTER — Other Ambulatory Visit: Payer: Self-pay | Admitting: Family Medicine

## 2021-06-27 DIAGNOSIS — K219 Gastro-esophageal reflux disease without esophagitis: Secondary | ICD-10-CM

## 2021-10-02 ENCOUNTER — Other Ambulatory Visit: Payer: Self-pay | Admitting: Cardiology

## 2021-12-24 ENCOUNTER — Telehealth: Payer: Self-pay | Admitting: Family Medicine
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
## 2021-12-24 NOTE — Telephone Encounter (Signed)
Caller Name: Kendricks Reap Call back phone #: [phone]  Reason for Call: Please call pt back. He was not specific to what he needed just asked for information within his chart

## 2021-12-25 ENCOUNTER — Other Ambulatory Visit: Payer: Self-pay | Admitting: Cardiology

## 2021-12-25 NOTE — Telephone Encounter (Signed)
Lft VM to rtn call. Dm/cma  

## 2021-12-25 NOTE — Telephone Encounter (Signed)
Please give pt a cb.

## 2021-12-25 NOTE — Telephone Encounter (Signed)
Patient notified [place] phone.  No other concerns.  (States its all okay).  Dm/cma

## 2021-12-28 ENCOUNTER — Encounter: Payer: Self-pay | Admitting: Physician Assistant

## 2021-12-28 ENCOUNTER — Ambulatory Visit (INDEPENDENT_AMBULATORY_CARE_PROVIDER_SITE_OTHER): Payer: Self-pay

## 2021-12-28 ENCOUNTER — Ambulatory Visit (INDEPENDENT_AMBULATORY_CARE_PROVIDER_SITE_OTHER): Payer: Self-pay | Admitting: Physician Assistant

## 2021-12-28 DIAGNOSIS — M25561 Pain in right knee: Secondary | ICD-10-CM
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
## 2022-01-02 ENCOUNTER — Telehealth: Payer: Self-pay | Admitting: Cardiology

## 2022-01-02 DIAGNOSIS — I5042 Chronic combined systolic (congestive) and diastolic (congestive) heart failure: Secondary | ICD-10-CM

## 2022-01-02 DIAGNOSIS — I10 Essential (primary) hypertension: Secondary | ICD-10-CM

## 2022-01-02 MED ORDER — ENTRESTO 97-103 MG PO TABS: 1.0000 | ORAL_TABLET | Freq: Two times a day (BID) | ORAL | 6 refills | Status: DC

## 2022-01-02 NOTE — Telephone Encounter (Signed)
*  STAT* If patient is at the pharmacy, call can be transferred to refill team.   1. Which medications need to be refilled? (please list name of each medication and dose if known)   ENTRESTO 97-103 MG   2. Which pharmacy/location (including street and city if local pharmacy) is medication to be sent to?  [id number] [place], [place] - [address]  3. Do they need a 30 day or 90 day supply?   90 day  Patient stated he is completely out of this medication.  Patient has appointment scheduled on 02/26/22.

## 2022-01-27 ENCOUNTER — Other Ambulatory Visit: Payer: Self-pay | Admitting: Family Medicine

## 2022-01-27 DIAGNOSIS — R000 Tachycardia, unspecified: Secondary | ICD-10-CM

## 2022-01-27 DIAGNOSIS — R42 Dizziness and giddiness: Secondary | ICD-10-CM

## 2022-01-27 DIAGNOSIS — I428 Other cardiomyopathies: Secondary | ICD-10-CM

## 2022-01-28 NOTE — Telephone Encounter (Signed)
Patient scheduled for 02/25/22. Dm/cma

## 2022-01-28 NOTE — Telephone Encounter (Signed)
Lft VM to rtn call. Dm/cma  

## 2022-02-15 ENCOUNTER — Encounter: Payer: Self-pay | Admitting: *Deleted

## 2022-02-25 ENCOUNTER — Ambulatory Visit: Payer: 59 | Admitting: Family Medicine

## 2022-02-25 DIAGNOSIS — K219 Gastro-esophageal reflux disease without esophagitis: Secondary | ICD-10-CM

## 2022-02-25 DIAGNOSIS — R000 Tachycardia, unspecified: Secondary | ICD-10-CM

## 2022-02-25 DIAGNOSIS — E1165 Type 2 diabetes mellitus with hyperglycemia: Secondary | ICD-10-CM

## 2022-02-25 DIAGNOSIS — I10 Essential (primary) hypertension: Secondary | ICD-10-CM

## 2022-02-25 DIAGNOSIS — G4700 Insomnia, unspecified: Secondary | ICD-10-CM

## 2022-02-25 DIAGNOSIS — R42 Dizziness and giddiness: Secondary | ICD-10-CM

## 2022-02-25 DIAGNOSIS — J309 Allergic rhinitis, unspecified: Secondary | ICD-10-CM

## 2022-02-25 DIAGNOSIS — I428 Other cardiomyopathies: Secondary | ICD-10-CM

## 2022-02-26 ENCOUNTER — Other Ambulatory Visit: Payer: Self-pay | Admitting: Family Medicine

## 2022-02-26 ENCOUNTER — Ambulatory Visit: Payer: Self-pay | Admitting: Cardiology

## 2022-02-26 DIAGNOSIS — I428 Other cardiomyopathies: Secondary | ICD-10-CM

## 2022-02-26 DIAGNOSIS — R42 Dizziness and giddiness: Secondary | ICD-10-CM

## 2022-02-26 DIAGNOSIS — R000 Tachycardia, unspecified: Secondary | ICD-10-CM

## 2022-02-27 ENCOUNTER — Telehealth: Payer: Self-pay | Admitting: Family Medicine

## 2022-02-27 ENCOUNTER — Encounter: Payer: 59 | Admitting: Family Medicine

## 2022-02-27 DIAGNOSIS — I428 Other cardiomyopathies: Secondary | ICD-10-CM

## 2022-02-27 DIAGNOSIS — K219 Gastro-esophageal reflux disease without esophagitis: Secondary | ICD-10-CM

## 2022-02-27 DIAGNOSIS — I10 Essential (primary) hypertension: Secondary | ICD-10-CM

## 2022-02-27 DIAGNOSIS — E1165 Type 2 diabetes mellitus with hyperglycemia: Secondary | ICD-10-CM

## 2022-02-27 DIAGNOSIS — Z0000 Encounter for general adult medical examination without abnormal findings: Secondary | ICD-10-CM

## 2022-02-27 NOTE — Telephone Encounter (Signed)
Pt was a no show for a cpe with Dr Gena Fray on 02/27/22, I sent a letter.

## 2022-02-28 ENCOUNTER — Ambulatory Visit (INDEPENDENT_AMBULATORY_CARE_PROVIDER_SITE_OTHER): Payer: 59 | Admitting: Family Medicine

## 2022-02-28 ENCOUNTER — Encounter: Payer: Self-pay | Admitting: Family Medicine

## 2022-02-28 VITALS — BP 132/70 | HR 84 | Temp 98.2°F | Ht 69.0 in | Wt 260.2 lb

## 2022-02-28 DIAGNOSIS — E1165 Type 2 diabetes mellitus with hyperglycemia: Secondary | ICD-10-CM

## 2022-02-28 DIAGNOSIS — K219 Gastro-esophageal reflux disease without esophagitis: Secondary | ICD-10-CM

## 2022-02-28 DIAGNOSIS — I428 Other cardiomyopathies: Secondary | ICD-10-CM

## 2022-02-28 DIAGNOSIS — L409 Psoriasis, unspecified: Secondary | ICD-10-CM

## 2022-02-28 DIAGNOSIS — E782 Mixed hyperlipidemia: Secondary | ICD-10-CM | POA: Diagnosis not present

## 2022-02-28 DIAGNOSIS — I10 Essential (primary) hypertension: Secondary | ICD-10-CM

## 2022-02-28 LAB — BASIC METABOLIC PANEL
BUN: 12 mg/dL (ref 6–23)
CO2: 26 mEq/L (ref 19–32)
Calcium: 9.4 mg/dL (ref 8.4–10.5)
Chloride: 103 mEq/L (ref 96–112)
Creatinine, Ser: 1.22 mg/dL (ref 0.40–1.50)
GFR: 65.66 mL/min (ref >60.00)
Glucose, Bld: 143 mg/dL — ABNORMAL HIGH (ref 70–99)
Potassium: 4.5 mEq/L (ref 3.5–5.1)
Sodium: 139 mEq/L (ref 135–145)

## 2022-02-28 LAB — MICROALBUMIN / CREATININE URINE RATIO
Creatinine,U: 364.7 mg/dL
Microalb Creat Ratio: 0.8 mg/g (ref 0.0–30.0)
Microalb, Ur: 3 mg/dL — ABNORMAL HIGH (ref 0.0–1.9)

## 2022-02-28 LAB — URINALYSIS, ROUTINE W REFLEX MICROSCOPIC
Ketones, ur: NEGATIVE
Leukocytes,Ua: NEGATIVE
Nitrite: NEGATIVE
Specific Gravity, Urine: >=1.03 — AB (ref 1.000–1.030)
Total Protein, Urine: NEGATIVE
Urine Glucose: NEGATIVE
Urobilinogen, UA: 0.2 (ref 0.0–1.0)
pH: 5.5 (ref 5.0–8.0)

## 2022-02-28 LAB — HEMOGLOBIN A1C: Hgb A1c MFr Bld: 7.1 % — ABNORMAL HIGH (ref 4.6–6.5)

## 2022-02-28 LAB — LIPID PANEL
Cholesterol: 228 mg/dL — ABNORMAL HIGH (ref 0–200)
HDL: 31.5 mg/dL — ABNORMAL LOW (ref >39.00)
LDL Cholesterol: 161 mg/dL — ABNORMAL HIGH (ref 0–99)
NonHDL: 196.26
Total CHOL/HDL Ratio: 7
Triglycerides: 178 mg/dL — ABNORMAL HIGH (ref 0.0–149.0)
VLDL: 35.6 mg/dL (ref 0.0–40.0)

## 2022-02-28 MED ORDER — METFORMIN HCL 1000 MG PO TABS: 1000.0000 mg | ORAL_TABLET | Freq: Every day | ORAL | 3 refills | Status: DC

## 2022-02-28 MED ORDER — TRIAMCINOLONE ACETONIDE 0.1 % EX CREA: 1.0000 | TOPICAL_CREAM | Freq: Two times a day (BID) | CUTANEOUS | 3 refills | Status: AC

## 2022-02-28 MED ORDER — CLOBETASOL PROPIONATE 0.05 % EX CREA: 1.0000 | TOPICAL_CREAM | Freq: Two times a day (BID) | CUTANEOUS | 2 refills | Status: DC | PRN

## 2022-02-28 MED ORDER — ROSUVASTATIN CALCIUM 20 MG PO TABS: 20.0000 mg | ORAL_TABLET | Freq: Every day | ORAL | 3 refills | Status: DC

## 2022-02-28 NOTE — Assessment & Plan Note (Signed)
He should restart rosuvastatin 20 mg daily. I will check his lipids today to see where he is at with his LDL.

## 2022-02-28 NOTE — Assessment & Plan Note (Signed)
Stable. He will let me know if his insurance will cover a different PPI

## 2022-02-28 NOTE — Assessment & Plan Note (Addendum)
Currently has HFpEF that is compensated. Recommend he continue metoprolol succinate 100 mg daily and Entresto 97-103 mg bid. Follow-up with cardiology.

## 2022-02-28 NOTE — Assessment & Plan Note (Addendum)
Blood pressure is in adequate control. Continue metoprolol succinate 100 mg daily and Entresto 97-103 mg bid. We will check annual kidney tests today.

## 2022-02-28 NOTE — Assessment & Plan Note (Signed)
Controlled with topical steroids. Notes he can use TAC cream for milder flares and uses clobetasol for more significant flares. I will renew these creams.

## 2022-02-28 NOTE — Assessment & Plan Note (Signed)
Annual DM labs today. Completed foot exam. I discussed the importance of an annual eye exam and encouraged him to follow through with scheduling this.

## 2022-02-28 NOTE — Progress Notes (Signed)
[facility] [address] [place] [address] Dept: [phone] Dept Fax: [phone]  Chronic Care Office Visit  Subjective:    Patient ID: Bryan English., male    DOB: [birth year], 57 y.o..   MRN: [id number]  Chief Complaint  Patient presents with   Annual Exam    CPE/labs.  Fasting today. No concerns.    History of Present Illness:  Patient is in today for reassessment of chronic medical issues.  Although scheduled for a physical, [name] had not been seen in 11 months. We discussed that priority should be given to follow-up of his chronic medical conditions, rather than a routine physical.  [name] a history of a nonischemic cardiomyopathy. Several years ago, his ejection fraction was 25-35%. His last EF by the local cardiolgoist was 50-55%. He is currently managed on Entresto and metoprolol. He had a heart cath in 2020 that showed a 30% stenosis of his proximal RCA. He denies any current issues with chest pain or dyspnea.   [name] a history of Type 2 diabetes. He is managed on metformin 1,000 mg daily. His blood sugars have remained in good control at home.   [name] also has a history of hyperlipidemia and is managed on rosuvastatin 20 mg daily. He notes he has been out of his medicine for the past several weeks..   [name] a history of GERD and is managed on pantoprazole. He states his insurance is not covering more than 30 days a year.  Past Medical History: Patient Active Problem List   Diagnosis Date Noted   Vertebral artery stenosis 05/22/2021   Aortic root enlargement ([place]) 02/01/2021   Hyperlipidemia 02/01/2021   Gastroesophageal reflux disease without esophagitis 01/01/2021   Tachyarrhythmia 09/20/2020   Ventricular tachycardia, polymorphic ([place])    History of colon polyps 05/08/2020   Psoriasis 03/27/2020   Type 2 diabetes mellitus with hyperglycemia, without  long-term current use of insulin ([place] [facility]) 06/12/2018   Class 2 severe obesity with serious comorbidity and body mass index (BMI) of 38.0 to 38.9 in adult [facility]) 06/12/2018   Nonischemic cardiomyopathy ([place]) 10/27/2017   Elevated coronary artery calcium score 10/14/2017   Essential hypertension 10/14/2017   Past Surgical History:  Procedure Laterality Date   HAND SURGERY     LEFT HEART CATH AND CORONARY ANGIOGRAPHY N/A 10/29/2017   Procedure: LEFT HEART CATH AND CORONARY ANGIOGRAPHY;  Surgeon: [name], MD;  Location: [facility];  Service: Cardiovascular;  Laterality: N/A;   Family History  Adopted: Yes  Problem Relation Age of Onset   Diabetes Mother    Colon cancer Neg Hx    Colon polyps Neg Hx    Esophageal cancer Neg Hx    Rectal cancer Neg Hx    Stomach cancer Neg Hx    Outpatient Medications Prior to Visit  Medication Sig Dispense Refill   Blood Glucose Monitoring Suppl (TRUE METRIX AIR GLUCOSE METER) w/Device KIT 1 kit by Does not apply route 2 (two) times daily. 1 kit 0   fluticasone (FLONASE) 50 MCG/ACT nasal spray Place 1 spray into both nostrils 2 (two) times daily. Reduce to once a day, after 1 month. (Patient taking differently: Place 1 spray into both nostrils 2 (two) times daily as needed for allergies or rhinitis.) 16 g 6   glucose blood (TRUE METRIX BLOOD GLUCOSE TEST) test strip Use as instructed to check blood sugars twice daily and as  needed 100 each 3   KLOR-CON M20 20 MEQ tablet TAKE 1 TABLET BY MOUTH EVERY DAY 30 tablet 2   metoprolol succinate (TOPROL-XL) 100 MG 24 hr tablet TAKE 1 TABLET BY MOUTH EVERY DAY 90 tablet 3   pantoprazole (PROTONIX) 40 MG tablet TAKE 1 TABLET BY MOUTH EVERY DAY 90 tablet 3   sacubitril-valsartan (ENTRESTO) 97-103 MG Take 1 tablet by mouth 2 (two) times daily. 60 tablet 6   TRUEPLUS LANCETS 28G MISC Use twice daily to check blood sugars and as needed 100 each 3   zolpidem (AMBIEN) 5 MG tablet Take 1 tablet (5 mg total) by  mouth at bedtime as needed for sleep. 15 tablet 1   clobetasol cream (TEMOVATE) [id number] % Apply 1 application topically 2 (two) times daily. (Patient taking differently: Apply 1 application  topically 2 (two) times daily as needed (for swelling, itching, or redness of affected areas).) 60 g prn   metFORMIN (GLUCOPHAGE) 1000 MG tablet Take 1 tablet (1,000 mg total) by mouth at bedtime. 90 tablet 3   triamcinolone cream (KENALOG) 0.1 % Apply 1 Application topically 2 (two) times daily.     rosuvastatin (CRESTOR) 20 MG tablet Take 1 tablet (20 mg total) by mouth daily. (Patient not taking: Reported on 02/28/2022) 90 tablet 0   No facility-administered medications prior to visit.   Allergies  Allergen Reactions   Jardiance [Empagliflozin] Other (See Comments)    Vertigo    Objective:   Today's Vitals   02/28/22 0846  BP: 132/70  Pulse: 84  Temp: 98.2 F (36.8 C)  TempSrc: Temporal  SpO2: 96%  Weight: 260 lb 3.2 oz (118 kg)  Height: 5' 9"$  (1.753 m)   Body mass index is 38.42 kg/m.   General: Well developed, well nourished. No acute distress. Lungs: Clear to auscultation bilaterally. No wheezing, rales or rhonchi. CV: RRR without murmurs or rubs. Pulses 2+ bilaterally. Feet- Skin intact. No sign of maceration between toes. Nails are normal. Dorsalis pedis and posterior tibial artery   pulses are normal. 5.07 monofilament testing normal. Psych: Alert and oriented. Normal mood and affect.  Health Maintenance Due  Topic Date Due   Zoster Vaccines- Shingrix (1 of 2) Never done   Diabetic kidney evaluation - Urine ACR  03/27/2021   HEMOGLOBIN A1C  07/02/2021   OPHTHALMOLOGY EXAM  08/14/2021   Diabetic kidney evaluation - eGFR measurement  10/13/2021     Assessment & Plan:   Problem List Items Addressed This Visit       Cardiovascular and Mediastinum   Essential hypertension    Blood pressure is in adequate control. Continue metoprolol succinate 100 mg daily and Entresto 97-103 mg  bid. We will check annual kidney tests today.       Relevant Medications   rosuvastatin (CRESTOR) 20 MG tablet   Nonischemic cardiomyopathy (HCC)    Currently has HFpEF that is compensated. Recommend he continue metoprolol succinate 100 mg daily and Entresto 97-103 mg bid. Follow-up with cardiology.      Relevant Medications   rosuvastatin (CRESTOR) 20 MG tablet     Digestive   Gastroesophageal reflux disease without esophagitis    Stable. He will let me know if his insurance will cover a different PPI        Endocrine   Type 2 diabetes mellitus with hyperglycemia, without long-term current use of insulin ([place]) - Primary    Annual DM labs today. Completed foot exam. I discussed the importance of an annual eye  exam and encouraged him to follow through with scheduling this.      Relevant Medications   metFORMIN (GLUCOPHAGE) 1000 MG tablet   rosuvastatin (CRESTOR) 20 MG tablet   Other Relevant Orders   Microalbumin / creatinine urine ratio   Basic metabolic panel   Hemoglobin A1c   Urinalysis, Routine w reflex microscopic     Musculoskeletal and Integument   Psoriasis    Controlled with topical steroids. Notes he can use TAC cream for milder flares and uses clobetasol for more significant flares. I will renew these creams.      Relevant Medications   clobetasol cream (TEMOVATE) 0.05 %   triamcinolone cream (KENALOG) 0.1 %     Other   Hyperlipidemia    He should restart rosuvastatin 20 mg daily. I will check his lipids today to see where he is at with his LDL.      Relevant Medications   rosuvastatin (CRESTOR) 20 MG tablet   Other Relevant Orders   Lipid panel    Return in about 3 months (around 05/29/2022) for Reassessment.   Haydee Salter, MD

## 2022-03-01 NOTE — Telephone Encounter (Signed)
1st no show w/in 12 months, fee waived, pt was seen 2/15

## 2022-03-26 ENCOUNTER — Other Ambulatory Visit: Payer: Self-pay | Admitting: Cardiology

## 2022-04-27 ENCOUNTER — Other Ambulatory Visit: Payer: Self-pay | Admitting: Cardiology

## 2022-05-28 ENCOUNTER — Other Ambulatory Visit: Payer: Self-pay | Admitting: Cardiology

## 2022-07-01 ENCOUNTER — Other Ambulatory Visit: Payer: Self-pay | Admitting: Family Medicine

## 2022-07-01 DIAGNOSIS — K219 Gastro-esophageal reflux disease without esophagitis: Secondary | ICD-10-CM

## 2022-07-15 ENCOUNTER — Ambulatory Visit: Payer: 59 | Attending: Nurse Practitioner | Admitting: Nurse Practitioner

## 2022-07-15 ENCOUNTER — Encounter: Payer: Self-pay | Admitting: Nurse Practitioner

## 2022-07-15 VITALS — BP 136/86 | HR 73 | Ht 68.0 in | Wt 258.2 lb

## 2022-07-15 DIAGNOSIS — I2510 Atherosclerotic heart disease of native coronary artery without angina pectoris: Secondary | ICD-10-CM | POA: Diagnosis not present

## 2022-07-15 DIAGNOSIS — E785 Hyperlipidemia, unspecified: Secondary | ICD-10-CM | POA: Diagnosis not present

## 2022-07-15 DIAGNOSIS — I10 Essential (primary) hypertension: Secondary | ICD-10-CM | POA: Diagnosis not present

## 2022-07-15 DIAGNOSIS — E119 Type 2 diabetes mellitus without complications: Secondary | ICD-10-CM

## 2022-07-15 DIAGNOSIS — I7789 Other specified disorders of arteries and arterioles: Secondary | ICD-10-CM | POA: Diagnosis not present

## 2022-07-15 DIAGNOSIS — I428 Other cardiomyopathies: Secondary | ICD-10-CM

## 2022-07-15 MED ORDER — ENTRESTO 97-103 MG PO TABS: 1.0000 | ORAL_TABLET | Freq: Two times a day (BID) | ORAL | 3 refills | Status: DC

## 2022-07-15 MED ORDER — METOPROLOL SUCCINATE ER 100 MG PO TB24: 100.0000 mg | ORAL_TABLET | Freq: Every day | ORAL | 3 refills | Status: DC

## 2022-07-15 NOTE — Patient Instructions (Signed)
Medication Instructions:  Your physician recommends that you continue on your current medications as directed. Please refer to the Current Medication list given to you today.  *If you need a refill on your cardiac medications before your next appointment, please call your pharmacy*   Lab Work: NONE ordered at this time of appointment     Testing/Procedures: CT-scan of the chest    Follow-Up: At [facility], you and your health needs are our priority.  As part of our continuing mission to provide you with exceptional heart care, we have created designated Provider Care Teams.  These Care Teams include your primary Cardiologist (physician) and Advanced Practice Providers (APPs -  Physician Assistants and Nurse Practitioners) who all work together to provide you with the care you need, when you need it.  We recommend signing up for the patient portal called "MyChart".  Sign up information is provided on this After Visit Summary.  MyChart is used to connect with patients for Virtual Visits (Telemedicine).  Patients are able to view lab/test results, encounter notes, upcoming appointments, etc.  Non-urgent messages can be sent to your provider as well.   To learn more about what you can do with MyChart, go to [email or web address].    Your next appointment:   1 year(s)  Provider:   Rollene Rotunda, MD

## 2022-07-15 NOTE — Progress Notes (Signed)
Office Visit    Patient Name: Bryan English. Date of Encounter: 07/15/2022  Primary Care Provider:  Loyola Mast, MD Primary Cardiologist:  Rollene Rotunda, MD  Chief Complaint    57 year old male with a history of nonobstructive CAD, NICM with improved EF, hypertension, hyperlipidemia, type 2 diabetes, and GERD who presents for follow-up related to CAD.  Past Medical History    Past Medical History:  Diagnosis Date   Diabetes mellitus without complication (HCC)    GERD (gastroesophageal reflux disease)    Hyperlipidemia    Hypertension    NICM (nonischemic cardiomyopathy) (HCC) 10/29/2017   EF 20% by echo, minimal CAD at cath   Past Surgical History:  Procedure Laterality Date   HAND SURGERY     LEFT HEART CATH AND CORONARY ANGIOGRAPHY N/A 10/29/2017   Procedure: LEFT HEART CATH AND CORONARY ANGIOGRAPHY;  Surgeon: Lennette Bihari, MD;  Location: [facility];  Service: Cardiovascular;  Laterality: N/A;    Allergies  Allergies  Allergen Reactions   Jardiance [Empagliflozin] Other (See Comments)    Vertigo      Labs/Other Studies Reviewed    The following studies were reviewed today:  Cardiac Studies & Procedures   CARDIAC CATHETERIZATION  CARDIAC CATHETERIZATION 10/29/2017  Narrative  Prox RCA lesion is 30% stenosed.  No significant coronary obstructive disease with smooth 25 -30% proximal RCA narrowing and a normal left coronary circulation.  LVEDP 17 mmHg.  Previous echo Doppler documented EF of 20 to 25%.  RECOMMENDATION: Guideline directed medical therapy for  nonischemic cardiomyopathy.  The patient will follow-up with Dr. Antoine Poche for further evaluation and treatment. Recommend Aspirin 81mg  daily for moderate CAD.  Findings Coronary Findings Diagnostic  Dominance: Right  Right Coronary Artery Prox RCA lesion is 30% stenosed.  Intervention  No interventions have been documented.     ECHOCARDIOGRAM  ECHOCARDIOGRAM  COMPLETE 03/15/2020  Narrative ECHOCARDIOGRAM REPORT    Patient Name:   Bryan English. Date of Exam: 03/15/2020 Medical Rec #:  [id number]                   Height:       68.0 in Accession #:    [id number]                  Weight:       273.4 lb Date of Birth:  November 16, 1965                   BSA:          2.334 m Patient Age:    54 years                    BP:           158/92 mmHg Patient Gender: M                           HR:           99 bpm. Exam Location:  [address]  Procedure: 2D Echo, 3D Echo, Cardiac Doppler, Color Doppler and Strain Analysis  Indications:    I42.8 Non-ischemic cardiomyopathy  History:        Patient has prior history of Echocardiogram examinations, most recent 01/25/2019. Cardiomyopathy, Arrythmias:Tachycardia. Bradycardia.; Risk Factors:Hypertension, Diabetes and Obesity.  Sonographer:    Garald Braver, RDCS Referring Phys: 60 RHONDA G BARRETT  IMPRESSIONS   1. Left ventricular  ejection fraction, by estimation, is 50 to 55%. Left ventricular ejection fraction by 3D volume is 55 %. The left ventricle has low normal function. The left ventricle has no regional wall motion abnormalities. There is mild left ventricular hypertrophy. Left ventricular diastolic parameters are consistent with Grade I diastolic dysfunction (impaired relaxation). The average left ventricular global longitudinal strain is -20.0 %. 2. Right ventricular systolic function is normal. The right ventricular size is normal. Tricuspid regurgitation signal is inadequate for assessing PA pressure. 3. The mitral valve is normal in structure. No evidence of mitral valve regurgitation. 4. The aortic valve is tricuspid. Aortic valve regurgitation is not visualized. No aortic stenosis is present. 5. Aortic dilatation noted. There is mild dilatation of the aortic root, measuring 43 mm. There is mild dilatation of the ascending aorta, measuring 38 mm. 6. The inferior vena cava is normal  in size with greater than 50% respiratory variability, suggesting right atrial pressure of 3 mmHg.  Comparison(s): 01/25/19 EF 50-55%. GLS -15.9%.  FINDINGS Left Ventricle: Left ventricular ejection fraction, by estimation, is 50 to 55%. Left ventricular ejection fraction by 3D volume is 55 %. The left ventricle has low normal function. The left ventricle has no regional wall motion abnormalities. The average left ventricular global longitudinal strain is -20.0 %. The left ventricular internal cavity size was normal in size. There is mild left ventricular hypertrophy. Left ventricular diastolic parameters are consistent with Grade I diastolic dysfunction (impaired relaxation).  Right Ventricle: The right ventricular size is normal. No increase in right ventricular wall thickness. Right ventricular systolic function is normal. Tricuspid regurgitation signal is inadequate for assessing PA pressure.  Left Atrium: Left atrial size was normal in size.  Right Atrium: Right atrial size was normal in size.  Pericardium: There is no evidence of pericardial effusion.  Mitral Valve: The mitral valve is normal in structure. No evidence of mitral valve regurgitation.  Tricuspid Valve: The tricuspid valve is normal in structure. Tricuspid valve regurgitation is trivial.  Aortic Valve: The aortic valve is tricuspid. Aortic valve regurgitation is not visualized. No aortic stenosis is present.  Pulmonic Valve: The pulmonic valve was not well visualized. Pulmonic valve regurgitation is not visualized.  Aorta: Aortic dilatation noted. There is mild dilatation of the aortic root, measuring 43 mm. There is mild dilatation of the ascending aorta, measuring 38 mm.  Venous: The inferior vena cava is normal in size with greater than 50% respiratory variability, suggesting right atrial pressure of 3 mmHg.  IAS/Shunts: No atrial level shunt detected by color flow Doppler.   LEFT VENTRICLE PLAX 2D LVIDd:          5.30 cm         Diastology LVIDs:         4.00 cm         LV e' medial:    6.45 cm/s LV PW:         1.20 cm         LV E/e' medial:  7.2 LV IVS:        1.20 cm         LV e' lateral:   10.70 cm/s LVOT diam:     2.60 cm         LV E/e' lateral: 4.4 LV SV:         101 LV SV Index:   43              2D LVOT Area:  5.31 cm        Longitudinal Strain 2D Strain GLS  -21.0 % (A2C): 2D Strain GLS  -17.7 % (A3C): 2D Strain GLS  -21.2 % (A4C): 2D Strain GLS  -20.0 % Avg:  3D Volume EF LV 3D EF:    Left ventricular ejection fraction by 3D volume is 55 %.  3D Volume EF: 3D EF:        55 % LV EDV:       204 ml LV ESV:       92 ml LV SV:        112 ml  RIGHT VENTRICLE RV Basal diam:  3.40 cm RV S prime:     11.80 cm/s TAPSE (M-mode): 2.0 cm  LEFT ATRIUM             Index       RIGHT ATRIUM           Index LA diam:        4.30 cm 1.84 cm/m  RA Pressure: 3.00 mmHg LA Vol (A2C):   50.3 ml 21.55 ml/m RA Area:     10.50 cm LA Vol (A4C):   28.4 ml 12.17 ml/m RA Volume:   20.90 ml  8.95 ml/m LA Biplane Vol: 39.4 ml 16.88 ml/m AORTIC VALVE LVOT Vmax:   83.60 cm/s LVOT Vmean:  59.500 cm/s LVOT VTI:    0.190 m  AORTA Ao Root diam: 4.30 cm Ao Asc diam:  3.80 cm  MITRAL VALVE               TRICUSPID VALVE Estimated RAP:  3.00 mmHg  MV E velocity: 46.60 cm/s  SHUNTS MV A velocity: 74.70 cm/s  Systemic VTI:  0.19 m MV E/A ratio:  0.62        Systemic Diam: 2.60 cm  Epifanio Lesches MD Electronically signed by Epifanio Lesches MD Signature Date/Time: 03/15/2020/1:29:43 PM    Final    MONITORS  LONG TERM MONITOR (3-14 DAYS) 06/15/2020  Narrative  Patch Wear Time: 13 days and 22 hours (2022-04-21T13:28:18-0400 to 2022-05-05T11:44:43-0400) Patient had a min HR of 51 bpm, max HR of 169 bpm, and avg HR of 72 bpm. Predominant underlying rhythm was Sinus Rhythm. 1 run of Ventricular Tachycardia occurred lasting 7 beats with a max rate of 169 bpm (avg 151 bpm).  Idioventricular Rhythm was present. Isolated SVEs were rare (<1.0%), SVE Couplets were rare (<1.0%), and SVE Triplets were rare (<1.0%). Isolated VEs were occasional (1.5%, 21676), VE Couplets were rare (<1.0%, 184), and VE Triplets were rare (<1.0%, 15). Ventricular Bigeminy and Trigeminy were present.  Normal sinus rhythm One run of non sustained ventricular tachycardia lasting 7 beats. Rare premature ventricular contractions Rare premature atrial contractions. No sustained arrhythmias          Recent Labs: 02/28/2022: BUN 12; Creatinine, Ser 1.22; Potassium 4.5; Sodium 139  Recent Lipid Panel    Component Value Date/Time   CHOL 228 (H) 02/28/2022 0936   CHOL 218 (H) 06/12/2018 1456   TRIG 178.0 (H) 02/28/2022 0936   HDL 31.50 (L) 02/28/2022 0936   HDL 31 (L) 06/12/2018 1456   CHOLHDL 7 02/28/2022 0936   VLDL 35.6 02/28/2022 0936   LDLCALC 161 (H) 02/28/2022 0936   LDLCALC 153 (H) 06/12/2018 1456    History of Present Illness    57 year old male with with the above past medical history including nonobstructive CAD, NICM with improved EF, hypertension, hyperlipidemia, type 2 diabetes, and GERD.  Cardiac catheterization  in 2019 revealed nonobstructive CAD.  Echocardiogram at the time showed EF 20 to 25%, diffuse hypokinesis, G2 DD, moderate to severely dilated left atrium, mild MR and trivial TR.  It was felt that his cardiomyopathy was nonischemic in nature.  Repeat echocardiogram in January 2021 showed EF improved to 50 to 55%, G1 DD, no significant valvular abnormalities.  Echocardiogram in 03/2020 showed stable LV function, G1 DD, mild dilation of the aortic root measuring 43 mm, mild dilation of ascending aorta measuring 38 mm.  He was hospitalized in April 2022 in the setting of chest pain.  Serial troponin was flat, not consistent with ACS.  No further ischemic workup was recommended.  Cardiac monitor in the setting of frequent PVCs revealed NSVT and bradycardia.  He was  hospitalized in September 2022 in the setting of irregular heartbeat.  Initial EKG demonstrated narrow complex tachycardia, prolonged QTc.  This occurred in the setting of hypokalemia and hypomagnesemia.  Both were replaced and no further arrhythmia was noted.  He was last seen in the office on 02/02/2021 and was stable from a cardiac standpoint. He denied any palpitations, denied symptoms concerning for angina.  He presents today for follow-up.  Since his last visit he has done well from a cardiac standpoint.  He denies any symptoms concerning for angina, denies dyspnea, edema, PND, orthopnea, weight gain.  BP has been overall well-controlled.  He notes that he was not taking his Crestor when his cholesterol was checked in February 2024.  He has since resumed Crestor and plans to have repeat labs drawn through his PCP.  Overall, he reports feeling well.  Home Medications    Current Outpatient Medications  Medication Sig Dispense Refill   Blood Glucose Monitoring Suppl (TRUE METRIX AIR GLUCOSE METER) w/Device KIT 1 kit by Does not apply route 2 (two) times daily. 1 kit 0   clobetasol cream (TEMOVATE) 0.05 % Apply 1 Application topically 2 (two) times daily as needed (for swelling, itching, or redness of affected areas). 45 g 2   glucose blood (TRUE METRIX BLOOD GLUCOSE TEST) test strip Use as instructed to check blood sugars twice daily and as needed 100 each 3   metFORMIN (GLUCOPHAGE) 1000 MG tablet Take 1 tablet (1,000 mg total) by mouth at bedtime. 90 tablet 3   metoprolol succinate (TOPROL-XL) 100 MG 24 hr tablet TAKE 1 TABLET BY MOUTH EVERY DAY 90 tablet 3   pantoprazole (PROTONIX) 40 MG tablet TAKE 1 TABLET BY MOUTH EVERY DAY 90 tablet 3   potassium chloride SA (KLOR-CON M20) 20 MEQ tablet TAKE 1 TABLET BY MOUTH EVERY DAY 15 tablet 0   sacubitril-valsartan (ENTRESTO) 97-103 MG Take 1 tablet by mouth 2 (two) times daily. 60 tablet 6   triamcinolone cream (KENALOG) 0.1 % Apply 1 Application  topically 2 (two) times daily. 45 g 3   TRUEPLUS LANCETS 28G MISC Use twice daily to check blood sugars and as needed 100 each 3   zolpidem (AMBIEN) 5 MG tablet Take 1 tablet (5 mg total) by mouth at bedtime as needed for sleep. 15 tablet 1   fluticasone (FLONASE) 50 MCG/ACT nasal spray Place 1 spray into both nostrils 2 (two) times daily. Reduce to once a day, after 1 month. (Patient taking differently: Place 1 spray into both nostrils 2 (two) times daily as needed for allergies or rhinitis.) 16 g 6   rosuvastatin (CRESTOR) 20 MG tablet Take 1 tablet (20 mg total) by mouth daily. (Patient not taking: Reported on 07/15/2022)  90 tablet 3   No current facility-administered medications for this visit.     Review of Systems    He denies chest pain, palpitations, dyspnea, pnd, orthopnea, n, v, dizziness, syncope, edema, weight gain, or early satiety. All other systems reviewed and are otherwise negative except as noted above.   Physical Exam    VS:  BP 136/86 (BP Location: Left Arm, Patient Position: Sitting, Cuff Size: Normal)   Pulse 73   Ht 5\' 8"  (1.727 m)   Wt 258 lb 3.2 oz (117.1 kg)   SpO2 96%   BMI 39.26 kg/m   GEN: Well nourished, well developed, in no acute distress. HEENT: normal. Neck: Supple, no JVD, carotid bruits, or masses. Cardiac: RRR, no murmurs, rubs, or gallops. No clubbing, cyanosis, edema.  Radials/DP/PT 2+ and equal bilaterally.  Respiratory:  Respirations regular and unlabored, clear to auscultation bilaterally. GI: Soft, nontender, nondistended, BS + x 4. MS: no deformity or atrophy. Skin: warm and dry, no rash. Neuro:  Strength and sensation are intact. Psych: Normal affect.  Accessory Clinical Findings    ECG personally reviewed by me today - EKG Interpretation Date/Time:  Monday July 15 2022 14:45:11 EDT Ventricular Rate:  73 PR Interval:  206 QRS Duration:  96 QT Interval:  402 QTC Calculation: 442 R Axis:   -21  Text Interpretation: Sinus rhythm with  occasional Premature ventricular complexes Low voltage QRS Inferior infarct (cited on or before 13-Oct-2020) T wave abnormality, consider lateral ischemia When compared with ECG of 13-Oct-2020 22:24, Premature ventricular complexes are now Present Inverted T waves have replaced nonspecific T wave abnormality in Lateral leads Confirmed by Bernadene Person ([id number]) on 07/15/2022 2:48:32 PM  - no acute changes.   Lab Results  Component Value Date   WBC 10.0 10/19/2020   HGB 14.2 10/19/2020   HCT 43.4 10/19/2020   MCV 77.8 (L) 10/19/2020   PLT 274.0 10/19/2020   Lab Results  Component Value Date   CREATININE 1.22 02/28/2022   BUN 12 02/28/2022   NA 139 02/28/2022   K 4.5 02/28/2022   CL 103 02/28/2022   CO2 26 02/28/2022   Lab Results  Component Value Date   ALT 35 10/13/2020   AST 20 10/13/2020   ALKPHOS 74 10/13/2020   BILITOT 0.4 10/13/2020   Lab Results  Component Value Date   CHOL 228 (H) 02/28/2022   HDL 31.50 (L) 02/28/2022   LDLCALC 161 (H) 02/28/2022   TRIG 178.0 (H) 02/28/2022   CHOLHDL 7 02/28/2022    Lab Results  Component Value Date   HGBA1C 7.1 (H) 02/28/2022    Assessment & Plan    1. CAD: Cardiac catheterization in 2019 revealed nonobstructive CAD. Stable with no anginal symptoms. No indication for ischemic evaluation.  Continue metoprolol, Entresto, Crestor.  2. NICM: Prior EF 20 to 25%, improved to 50 to 55%. Euvolemic and well compensated on exam.  Continue current medications as above.  3. Hypertension: BP well controlled. Continue current antihypertensive regimen.   4. Hyperlipidemia: LDL was 161 on 02/2022.  He notes he was not taking Crestor at the time.  He has since resumed Crestor.  He is pending repeat labs per PCP.  Continue Crestor.  If LDL remains above goal, consider increasing Crestor as tolerated.  5. Ascending aorta/aortic root dilation: Mild dilation of the aortic root measuring 43 mm, mild dilation of ascending aorta measuring 38 mm noted on  prior echo in 2022. Will order follow-up CT chest/aorta for routine monitoring.  BP well-controlled.  6. Type 2 diabetes: A1c was 7.1 in 02/2022. Monitored and managed per PCP.   7. Disposition: Follow-up in 1 year.     Joylene Grapes, NP 07/15/2022, 3:07 PM

## 2022-07-31 ENCOUNTER — Telehealth: Payer: Self-pay | Admitting: Family Medicine

## 2022-07-31 ENCOUNTER — Other Ambulatory Visit: Payer: Self-pay | Admitting: Family Medicine

## 2022-07-31 DIAGNOSIS — K219 Gastro-esophageal reflux disease without esophagitis: Secondary | ICD-10-CM

## 2022-07-31 NOTE — Telephone Encounter (Signed)
  [id number], [place] - [address] [place] [address] Phone: [phone]  Fax: [phone]   They need a new script for this med for taking 2 times daily.

## 2022-07-31 NOTE — Telephone Encounter (Signed)
RX sent earlier to pharmacy for 1 time daily.  Left VM and asked for a return call. Dm/cma

## 2022-08-02 ENCOUNTER — Ambulatory Visit (HOSPITAL_BASED_OUTPATIENT_CLINIC_OR_DEPARTMENT_OTHER): Payer: 59

## 2022-11-13 ENCOUNTER — Other Ambulatory Visit: Payer: Self-pay | Admitting: Family Medicine

## 2022-11-13 DIAGNOSIS — K219 Gastro-esophageal reflux disease without esophagitis: Secondary | ICD-10-CM

## 2022-12-06 ENCOUNTER — Telehealth: Payer: Self-pay | Admitting: Family Medicine

## 2022-12-06 NOTE — Telephone Encounter (Signed)
Left detailed VM that after calling [facility] it looks as if the insurance is only wanting to pay for 30 days at a time.    We only have documentation on 1 tablet daily.  So the options are to wait till provider is back in the office or he can call his GI doctor to get them to send in Rx for twice daily. Dm/cma

## 2022-12-06 NOTE — Telephone Encounter (Signed)
When pt picked this med up pantoprazole (PROTONIX) 40 MG tablet [[id number]] on 10/31 he was only given 30 tablets. I see 180 for 3 refills.  He stated that his GI dr had changed the dosage to 2 pills per day. He would like for you to change that and let them know to give him the correct amount.   [id number], [place] - [address] [place] [address] Phone: [phone]  Fax: [phone]

## 2023-02-10 ENCOUNTER — Other Ambulatory Visit: Payer: Self-pay | Admitting: Family Medicine

## 2023-02-10 DIAGNOSIS — K219 Gastro-esophageal reflux disease without esophagitis: Secondary | ICD-10-CM

## 2023-03-12 ENCOUNTER — Other Ambulatory Visit: Payer: Self-pay | Admitting: Family Medicine

## 2023-03-12 DIAGNOSIS — K219 Gastro-esophageal reflux disease without esophagitis: Secondary | ICD-10-CM

## 2023-03-18 ENCOUNTER — Other Ambulatory Visit: Payer: Self-pay | Admitting: Family Medicine

## 2023-03-18 DIAGNOSIS — E782 Mixed hyperlipidemia: Secondary | ICD-10-CM

## 2023-04-19 ENCOUNTER — Other Ambulatory Visit: Payer: Self-pay | Admitting: Family Medicine

## 2023-04-19 DIAGNOSIS — E782 Mixed hyperlipidemia: Secondary | ICD-10-CM

## 2023-05-18 ENCOUNTER — Other Ambulatory Visit: Payer: Self-pay | Admitting: Family Medicine

## 2023-05-18 DIAGNOSIS — E782 Mixed hyperlipidemia: Secondary | ICD-10-CM

## 2023-05-26 ENCOUNTER — Encounter: Payer: Self-pay | Admitting: Family Medicine

## 2023-05-26 ENCOUNTER — Ambulatory Visit (INDEPENDENT_AMBULATORY_CARE_PROVIDER_SITE_OTHER): Admitting: Family Medicine

## 2023-05-26 VITALS — BP 138/80 | HR 80 | Temp 97.3°F | Ht 68.0 in | Wt 254.6 lb

## 2023-05-26 DIAGNOSIS — E1165 Type 2 diabetes mellitus with hyperglycemia: Secondary | ICD-10-CM | POA: Diagnosis not present

## 2023-05-26 DIAGNOSIS — I428 Other cardiomyopathies: Secondary | ICD-10-CM | POA: Diagnosis not present

## 2023-05-26 DIAGNOSIS — L409 Psoriasis, unspecified: Secondary | ICD-10-CM

## 2023-05-26 DIAGNOSIS — I10 Essential (primary) hypertension: Secondary | ICD-10-CM

## 2023-05-26 DIAGNOSIS — K219 Gastro-esophageal reflux disease without esophagitis: Secondary | ICD-10-CM

## 2023-05-26 DIAGNOSIS — Z7984 Long term (current) use of oral hypoglycemic drugs: Secondary | ICD-10-CM

## 2023-05-26 DIAGNOSIS — E782 Mixed hyperlipidemia: Secondary | ICD-10-CM | POA: Diagnosis not present

## 2023-05-26 LAB — LIPID PANEL
Cholesterol: 131 mg/dL (ref 0–200)
HDL: 29.3 mg/dL — ABNORMAL LOW (ref >39.00)
LDL Cholesterol: 76 mg/dL (ref 0–99)
NonHDL: 102.16
Total CHOL/HDL Ratio: 4
Triglycerides: 130 mg/dL (ref 0.0–149.0)
VLDL: 26 mg/dL (ref 0.0–40.0)

## 2023-05-26 LAB — URINALYSIS, ROUTINE W REFLEX MICROSCOPIC
Ketones, ur: NEGATIVE
Leukocytes,Ua: NEGATIVE
Nitrite: NEGATIVE
Specific Gravity, Urine: >=1.03 — AB (ref 1.000–1.030)
Total Protein, Urine: 30 — AB
Urine Glucose: NEGATIVE
Urobilinogen, UA: 0.2 (ref 0.0–1.0)
pH: 6 (ref 5.0–8.0)

## 2023-05-26 LAB — MICROALBUMIN / CREATININE URINE RATIO
Creatinine,U: 452.8 mg/dL
Microalb Creat Ratio: 15 mg/g (ref 0.0–30.0)
Microalb, Ur: 6.8 mg/dL — ABNORMAL HIGH (ref 0.0–1.9)

## 2023-05-26 LAB — BASIC METABOLIC PANEL WITH GFR
BUN: 14 mg/dL (ref 6–23)
CO2: 24 meq/L (ref 19–32)
Calcium: 9.3 mg/dL (ref 8.4–10.5)
Chloride: 104 meq/L (ref 96–112)
Creatinine, Ser: 1.1 mg/dL (ref 0.40–1.50)
GFR: 73.71 mL/min (ref >60.00)
Glucose, Bld: 157 mg/dL — ABNORMAL HIGH (ref 70–99)
Potassium: 4.7 meq/L (ref 3.5–5.1)
Sodium: 138 meq/L (ref 135–145)

## 2023-05-26 LAB — HEMOGLOBIN A1C: Hgb A1c MFr Bld: 6.8 % — ABNORMAL HIGH (ref 4.6–6.5)

## 2023-05-26 MED ORDER — ROSUVASTATIN CALCIUM 20 MG PO TABS: 20.0000 mg | ORAL_TABLET | Freq: Every day | ORAL | 3 refills | Status: DC

## 2023-05-26 MED ORDER — TRUE METRIX BLOOD GLUCOSE TEST VI STRP: ORAL_STRIP | 3 refills | Status: AC

## 2023-05-26 MED ORDER — TRUE METRIX AIR GLUCOSE METER W/DEVICE KIT: 1.0000 | PACK | Freq: Two times a day (BID) | 0 refills | Status: AC

## 2023-05-26 MED ORDER — PANTOPRAZOLE SODIUM 40 MG PO TBEC: 40.0000 mg | DELAYED_RELEASE_TABLET | Freq: Two times a day (BID) | ORAL | 3 refills | Status: DC

## 2023-05-26 NOTE — Assessment & Plan Note (Signed)
 HFpEF that is compensated. Recommend he continue metoprolol  succinate 100 mg daily and sacubitril -valsartan  (Entresto ) 97-103 mg bid. Follow-up with cardiology.

## 2023-05-26 NOTE — Assessment & Plan Note (Signed)
Blood pressure is in adequate control. Continue metoprolol succinate 100 mg daily and Entresto 97-103 mg bid. We will check annual kidney tests today.

## 2023-05-26 NOTE — Assessment & Plan Note (Signed)
 Cotninue rosuvastatin  20 mg daily. I will check his lipids today to see where he is at with his LDL.

## 2023-05-26 NOTE — Assessment & Plan Note (Signed)
 Stable. Continue pantoprazole  40 mg bid.

## 2023-05-26 NOTE — Progress Notes (Signed)
 [facility] PRIMARY CARE [facility] [address] [place] [address] Dept: [phone] Dept Fax: [phone]  Chronic Care Office Visit  Subjective:    Patient ID: Cheyenne Cotta., male    DOB: 1965-05-28, 58 y.o..   MRN: [id number]  Chief Complaint  Patient presents with   Diabetes    F/U Diabetes.  Fasting today.     History of Present Illness:  Patient is in today for reassessment of chronic medical issues. He was last seen 15 months ago.  Mr. Suman has a history of a nonischemic cardiomyopathy. Several years ago, his ejection fraction was 25-35%. His last EF (03/2020) by the local cardiolgoist was 50-55%. He is currently managed on sacubitril -valsartan  (Entresto ) 97-103 mg bid and metoprolol  succinate 100 mg daily. He had a heart cath in 2020 that showed a 30% stenosis of his proximal RCA. He denies any current issues with chest pain, dyspnea, orthopnea, or lower leg edema.   Mr. Trebing has a history of Type 2 diabetes. He is managed on metformin  1,000 mg daily. His blood sugars have remained in good control at home.   Mr. Macisaac also has a history of hyperlipidemia and is managed on rosuvastatin  20 mg daily.    Mr. Levison has a history of GERD and is managed on pantoprazole  40 mg bid.  Past Medical History: Patient Active Problem List   Diagnosis Date Noted   Aortic root enlargement (HCC) 02/01/2021   Hyperlipidemia 02/01/2021   Gastroesophageal reflux disease without esophagitis 01/01/2021   Tachyarrhythmia 09/20/2020   Ventricular tachycardia, polymorphic (HCC)    History of colon polyps 05/08/2020   Psoriasis 03/27/2020   Type 2 diabetes mellitus with hyperglycemia, without long-term current use of insulin  (HCC) 06/12/2018   Class 2 severe obesity with serious comorbidity and body mass index (BMI) of 38.0 to 38.9 in adult [facility]) 06/12/2018   Nonischemic cardiomyopathy (HCC) 10/27/2017   Elevated coronary artery calcium   score 10/14/2017   Essential hypertension 10/14/2017   Past Surgical History:  Procedure Laterality Date   HAND SURGERY     LEFT HEART CATH AND CORONARY ANGIOGRAPHY N/A 10/29/2017   Procedure: LEFT HEART CATH AND CORONARY ANGIOGRAPHY;  Surgeon: Millicent Ally, MD;  Location: [facility] INVASIVE CV LAB;  Service: Cardiovascular;  Laterality: N/A;   Family History  Adopted: Yes  Problem Relation Age of Onset   Diabetes Mother    Colon cancer Neg Hx    Colon polyps Neg Hx    Esophageal cancer Neg Hx    Rectal cancer Neg Hx    Stomach cancer Neg Hx    Outpatient Medications Prior to Visit  Medication Sig Dispense Refill   fluticasone  (FLONASE ) 50 MCG/ACT nasal spray Place 1 spray into both nostrils 2 (two) times daily. Reduce to once a day, after 1 month. (Patient taking differently: Place 1 spray into both nostrils 2 (two) times daily as needed for allergies or rhinitis.) 16 g 6   metFORMIN  (GLUCOPHAGE ) 1000 MG tablet Take 1 tablet (1,000 mg total) by mouth at bedtime. 90 tablet 3   metoprolol  succinate (TOPROL -XL) 100 MG 24 hr tablet Take 1 tablet (100 mg total) by mouth daily. 90 tablet 3   potassium chloride  SA (KLOR-CON  M20) 20 MEQ tablet TAKE 1 TABLET BY MOUTH EVERY DAY 15 tablet 0   sacubitril -valsartan  (ENTRESTO ) 97-103 MG Take 1 tablet by mouth 2 (two) times daily. 180 tablet 3   triamcinolone  cream (KENALOG ) 0.1 % Apply 1 Application topically 2 (two)  times daily. 45 g 3   TRUEPLUS LANCETS 28G MISC Use twice daily to check blood sugars and as needed 100 each 3   zolpidem  (AMBIEN ) 5 MG tablet Take 1 tablet (5 mg total) by mouth at bedtime as needed for sleep. 15 tablet 1   Blood Glucose Monitoring Suppl (TRUE METRIX AIR GLUCOSE METER) w/Device KIT 1 kit by Does not apply route 2 (two) times daily. 1 kit 0   glucose blood (TRUE METRIX BLOOD GLUCOSE TEST) test strip Use as instructed to check blood sugars twice daily and as needed 100 each 3   pantoprazole  (PROTONIX ) 40 MG tablet TAKE 1  TABLET BY MOUTH EVERY DAY 30 tablet 0   rosuvastatin  (CRESTOR ) 20 MG tablet TAKE 1 TABLET BY MOUTH EVERY DAY 30 tablet 0   clobetasol  cream (TEMOVATE ) 0.05 % Apply 1 Application topically 2 (two) times daily as needed (for swelling, itching, or redness of affected areas). (Patient not taking: Reported on 05/26/2023) 45 g 2   No facility-administered medications prior to visit.   Allergies  Allergen Reactions   Jardiance  [Empagliflozin ] Other (See Comments)    Vertigo    Objective:   Today's Vitals   05/26/23 1106  BP: (!) 158/86  Pulse: 80  Temp: (!) 97.3 F (36.3 C)  TempSrc: Temporal  SpO2: 95%  Weight: 254 lb 9.6 oz (115.5 kg)  Height: 5\' 8"  (1.727 m)   Body mass index is 38.71 kg/m.   General: Well developed, well nourished. No acute distress. CV: RRR without murmurs or rubs. Pulses 2+ bilaterally. Extremities: No edema noted. Skin: Warm and dry. There is a patch of scaly rash on the lateral aspect of the left calf. Feet- Skin intact. No sign of maceration between toes. Nails are normal. Dorsalis pedis and posterior tibial artery   pulses are normal. 5.07 monofilament testing normal. Psych: Alert and oriented. Normal mood and affect.  Health Maintenance Due  Topic Date Due   Zoster Vaccines- Shingrix (1 of 2) Never done   OPHTHALMOLOGY EXAM  08/14/2021   HEMOGLOBIN A1C  08/29/2022   Diabetic kidney evaluation - eGFR measurement  03/01/2023   Diabetic kidney evaluation - Urine ACR  03/01/2023     Assessment & Plan:   Problem List Items Addressed This Visit       Cardiovascular and Mediastinum   Essential hypertension   Blood pressure is in adequate control. Continue metoprolol  succinate 100 mg daily and Entresto  97-103 mg bid. We will check annual kidney tests today.       Relevant Medications   rosuvastatin  (CRESTOR ) 20 MG tablet   Nonischemic cardiomyopathy (HCC) - Primary   HFpEF that is compensated. Recommend he continue metoprolol  succinate 100 mg  daily and sacubitril -valsartan  (Entresto ) 97-103 mg bid. Follow-up with cardiology.      Relevant Medications   rosuvastatin  (CRESTOR ) 20 MG tablet     Digestive   Gastroesophageal reflux disease without esophagitis   Stable. Continue pantoprazole  40 mg bid.      Relevant Medications   pantoprazole  (PROTONIX ) 40 MG tablet     Endocrine   Type 2 diabetes mellitus with hyperglycemia, without long-term current use of insulin  (HCC)   Annual DM labs today. Completed foot exam. Continue metformin  1,00 mg daily.      Relevant Medications   rosuvastatin  (CRESTOR ) 20 MG tablet   glucose blood (TRUE METRIX BLOOD GLUCOSE TEST) test strip   Blood Glucose Monitoring Suppl (TRUE METRIX AIR GLUCOSE METER) w/Device KIT   Other Relevant  Orders   Microalbumin / creatinine urine ratio   Basic metabolic panel with GFR   Hemoglobin A1c   Urinalysis, Routine w reflex microscopic     Musculoskeletal and Integument   Psoriasis   Controlled with topical steroids. Notes he can use TAC cream for milder flares and uses clobetasol  for more significant flares.         Other   Hyperlipidemia   Cotninue rosuvastatin  20 mg daily. I will check his lipids today to see where he is at with his LDL.      Relevant Medications   rosuvastatin  (CRESTOR ) 20 MG tablet   Other Relevant Orders   Lipid panel    Return in about 4 months (around 09/26/2023) for Reassessment.   Graig Lawyer, MD

## 2023-05-26 NOTE — Assessment & Plan Note (Signed)
 Controlled with topical steroids. Notes he can use TAC cream for milder flares and uses clobetasol  for more significant flares.

## 2023-05-26 NOTE — Assessment & Plan Note (Signed)
 Annual DM labs today. Completed foot exam. Continue metformin  1,00 mg daily.

## 2023-05-27 ENCOUNTER — Other Ambulatory Visit: Payer: Self-pay | Admitting: Family Medicine

## 2023-05-27 DIAGNOSIS — E1165 Type 2 diabetes mellitus with hyperglycemia: Secondary | ICD-10-CM

## 2023-05-27 MED ORDER — METFORMIN HCL 1000 MG PO TABS: 1000.0000 mg | ORAL_TABLET | Freq: Every day | ORAL | 3 refills | Status: AC

## 2023-05-27 NOTE — Telephone Encounter (Signed)
 Agent called - she sent empty request encounter- corrected and sent to office.(Metformin /[facility]/[place])

## 2023-06-05 ENCOUNTER — Encounter: Payer: Self-pay | Admitting: Cardiology

## 2023-07-08 ENCOUNTER — Other Ambulatory Visit: Payer: Self-pay | Admitting: Nurse Practitioner

## 2023-07-08 DIAGNOSIS — I10 Essential (primary) hypertension: Secondary | ICD-10-CM

## 2023-07-08 DIAGNOSIS — I428 Other cardiomyopathies: Secondary | ICD-10-CM

## 2023-07-27 ENCOUNTER — Other Ambulatory Visit: Payer: Self-pay | Admitting: Cardiology

## 2023-08-03 ENCOUNTER — Emergency Department (HOSPITAL_BASED_OUTPATIENT_CLINIC_OR_DEPARTMENT_OTHER)
Admission: EM | Admit: 2023-08-03 | Discharge: 2023-08-03 | Disposition: A | Discharge status: Home / Self Care | Attending: Emergency Medicine | Admitting: Emergency Medicine

## 2023-08-03 ENCOUNTER — Encounter (HOSPITAL_BASED_OUTPATIENT_CLINIC_OR_DEPARTMENT_OTHER): Payer: Self-pay

## 2023-08-03 ENCOUNTER — Other Ambulatory Visit: Payer: Self-pay

## 2023-08-03 DIAGNOSIS — Z79899 Other long term (current) drug therapy: Secondary | ICD-10-CM | POA: Diagnosis not present

## 2023-08-03 DIAGNOSIS — R42 Dizziness and giddiness: Secondary | ICD-10-CM | POA: Diagnosis not present

## 2023-08-03 DIAGNOSIS — I10 Essential (primary) hypertension: Secondary | ICD-10-CM | POA: Diagnosis not present

## 2023-08-03 DIAGNOSIS — E119 Type 2 diabetes mellitus without complications: Secondary | ICD-10-CM | POA: Diagnosis not present

## 2023-08-03 DIAGNOSIS — Z7984 Long term (current) use of oral hypoglycemic drugs: Secondary | ICD-10-CM | POA: Insufficient documentation

## 2023-08-03 DIAGNOSIS — E860 Dehydration: Secondary | ICD-10-CM | POA: Diagnosis not present

## 2023-08-03 LAB — COMPREHENSIVE METABOLIC PANEL WITH GFR
ALT: 21 U/L (ref 0–44)
AST: 22 U/L (ref 15–41)
Albumin: 4.3 g/dL (ref 3.5–5.0)
Alkaline Phosphatase: 75 U/L (ref 38–126)
Anion gap: 16 — ABNORMAL HIGH (ref 5–15)
BUN: 21 mg/dL — ABNORMAL HIGH (ref 6–20)
CO2: 20 mmol/L — ABNORMAL LOW (ref 22–32)
Calcium: 9.1 mg/dL (ref 8.9–10.3)
Chloride: 103 mmol/L (ref 98–111)
Creatinine, Ser: 1.09 mg/dL (ref 0.61–1.24)
GFR, Estimated: >60 mL/min (ref >60)
Glucose, Bld: 178 mg/dL — ABNORMAL HIGH (ref 70–99)
Potassium: 3.6 mmol/L (ref 3.5–5.1)
Sodium: 139 mmol/L (ref 135–145)
Total Bilirubin: 0.3 mg/dL (ref 0.0–1.2)
Total Protein: 7.9 g/dL (ref 6.5–8.1)

## 2023-08-03 LAB — URINALYSIS, ROUTINE W REFLEX MICROSCOPIC
Bilirubin Urine: NEGATIVE
Glucose, UA: NEGATIVE mg/dL
Ketones, ur: NEGATIVE mg/dL
Leukocytes,Ua: NEGATIVE
Nitrite: NEGATIVE
Protein, ur: 30 mg/dL — AB
Specific Gravity, Urine: >=1.03 (ref 1.005–1.030)
pH: 6 (ref 5.0–8.0)

## 2023-08-03 LAB — URINE DRUG SCREEN
Amphetamines: NOT DETECTED
Barbiturates: NOT DETECTED
Benzodiazepines: NOT DETECTED
Cocaine: NOT DETECTED
Fentanyl: NOT DETECTED
Methadone Scn, Ur: NOT DETECTED
Opiates: NOT DETECTED
Tetrahydrocannabinol: NOT DETECTED

## 2023-08-03 LAB — URINALYSIS, MICROSCOPIC (REFLEX)

## 2023-08-03 LAB — CBC WITH DIFFERENTIAL/PLATELET
Abs Immature Granulocytes: 0.02 K/uL (ref 0.00–0.07)
Basophils Absolute: 0 K/uL (ref 0.0–0.1)
Basophils Relative: 0 %
Eosinophils Absolute: 0.4 K/uL (ref 0.0–0.5)
Eosinophils Relative: 3 %
HCT: 43.6 % (ref 39.0–52.0)
Hemoglobin: 14 g/dL (ref 13.0–17.0)
Immature Granulocytes: 0 %
Lymphocytes Relative: 39 %
Lymphs Abs: 4.9 K/uL — ABNORMAL HIGH (ref 0.7–4.0)
MCH: 25.1 pg — ABNORMAL LOW (ref 26.0–34.0)
MCHC: 32.1 g/dL (ref 30.0–36.0)
MCV: 78.3 fL — ABNORMAL LOW (ref 80.0–100.0)
Monocytes Absolute: 1 K/uL (ref 0.1–1.0)
Monocytes Relative: 8 %
Neutro Abs: 6.1 K/uL (ref 1.7–7.7)
Neutrophils Relative %: 50 %
Platelets: 307 K/uL (ref 150–400)
RBC: 5.57 MIL/uL (ref 4.22–5.81)
RDW: 13.2 % (ref 11.5–15.5)
WBC: 12.4 K/uL — ABNORMAL HIGH (ref 4.0–10.5)
nRBC: 0 % (ref 0.0–0.2)

## 2023-08-03 LAB — ETHANOL: Alcohol, Ethyl (B): <15 mg/dL (ref <15)

## 2023-08-03 LAB — TROPONIN T, HIGH SENSITIVITY: Troponin T High Sensitivity: <15 ng/L (ref <19)

## 2023-08-03 LAB — MAGNESIUM: Magnesium: 2 mg/dL (ref 1.7–2.4)

## 2023-08-03 LAB — CBG MONITORING, ED: Glucose-Capillary: 197 mg/dL — ABNORMAL HIGH (ref 70–99)

## 2023-08-03 MED ORDER — SODIUM CHLORIDE 0.9 % IV BOLUS
1000.0000 mL | Freq: Once | INTRAVENOUS | Status: AC
  Administered 2023-08-03: 1000 mL via INTRAVENOUS

## 2023-08-03 NOTE — Discharge Instructions (Signed)
   RETURN IMMEDIATELY IF  you develop new shortness of breath, chest pain, fever, have difficulty moving parts of your body (new weakness, numbness, or incoordination), sudden change in speech, vision, swallowing, or understanding, faint or develop new dizziness, severe headache, become poorly responsive or have an altered mental status compared to baseline for you, new rash, abdominal pain, or bloody stools,  Return sooner also if you develop new problems for which you have not talked to your caregiver but you feel may be emergency medical conditions.

## 2023-08-03 NOTE — ED Provider Notes (Signed)
 [place] [facility] Provider Note   CSN: [id number] Arrival date & time: 08/03/23  0340     Patient presents with: Dizziness   Bryan English. is a 58 y.o. male.   The history is provided by the patient.  Patient with History of diabetes, hypertension, obesity, nonischemic cardiomyopathy presents with lightheadedness and dizziness.  Patient reports he was watching television earlier tonight when he felt lightheaded.  He is able to ambulate but feels like he might fall.  No falls or traumas reported.  No syncope.  No chest pain or shortness of breath.  No focal weakness.  Denies any visual changes.  Reports compliance with all his medications and no new changes.  He does admit to drinking beer tonight. He is not on anticoagulation   Past Medical History:  Diagnosis Date   Diabetes mellitus without complication (HCC)    GERD (gastroesophageal reflux disease)    Hyperlipidemia    Hypertension    NICM (nonischemic cardiomyopathy) (HCC) 10/29/2017   EF 20% by echo, minimal CAD at cath    Prior to Admission medications   Medication Sig Start Date End Date Taking? Authorizing Provider  Blood Glucose Monitoring Suppl (TRUE METRIX AIR GLUCOSE METER) w/Device KIT 1 kit by Does not apply route 2 (two) times daily. 05/26/23   Thedora Garnette HERO, MD  clobetasol  cream (TEMOVATE ) 0.05 % Apply 1 Application topically 2 (two) times daily as needed (for swelling, itching, or redness of affected areas). Patient not taking: Reported on 05/26/2023 02/28/22   Thedora Garnette HERO, MD  fluticasone  (FLONASE ) 50 MCG/ACT nasal spray Place 1 spray into both nostrils 2 (two) times daily. Reduce to once a day, after 1 month. Patient taking differently: Place 1 spray into both nostrils 2 (two) times daily as needed for allergies or rhinitis. 09/05/20 05/26/23  Thedora Garnette HERO, MD  glucose blood (TRUE METRIX BLOOD GLUCOSE TEST) test strip Use as instructed to check blood  sugars twice daily and as needed 05/26/23   Thedora Garnette HERO, MD  metFORMIN  (GLUCOPHAGE ) 1000 MG tablet Take 1 tablet (1,000 mg total) by mouth at bedtime. 05/27/23   Thedora Garnette HERO, MD  metoprolol  succinate (TOPROL -XL) 100 MG 24 hr tablet Take 1 tablet (100 mg total) by mouth daily. 07/15/22   Daneen Damien BROCKS, NP  pantoprazole  (PROTONIX ) 40 MG tablet Take 1 tablet (40 mg total) by mouth 2 (two) times daily. 05/26/23   Thedora Garnette HERO, MD  potassium chloride  SA (KLOR-CON  M20) 20 MEQ tablet TAKE 1 TABLET BY MOUTH EVERY DAY 07/29/23   Lavona Agent, MD  rosuvastatin  (CRESTOR ) 20 MG tablet Take 1 tablet (20 mg total) by mouth daily. 05/26/23   Thedora Garnette HERO, MD  sacubitril -valsartan  (ENTRESTO ) 97-103 MG Take 1 tablet by mouth 2 (two) times daily. Please call [phone] to schedule an appointment for future refills. Thank you. 07/10/23   Lavona Agent, MD  triamcinolone  cream (KENALOG ) 0.1 % Apply 1 Application topically 2 (two) times daily. 02/28/22   Thedora Garnette HERO, MD  TRUEPLUS LANCETS 28G MISC Use twice daily to check blood sugars and as needed 12/02/17   [name], [name], MD  zolpidem  (AMBIEN ) 5 MG tablet Take 1 tablet (5 mg total) by mouth at bedtime as needed for sleep. 09/05/20   Thedora Garnette HERO, MD    Allergies: Jardiance  Cassius.Caster ]    Review of Systems  Constitutional:  Negative for fever.  Eyes:  Negative for visual disturbance.  Respiratory:  Negative  for shortness of breath.   Cardiovascular:  Negative for chest pain.  Gastrointestinal:  Negative for abdominal pain and vomiting.  Musculoskeletal:  Negative for back pain.  Neurological:  Positive for light-headedness. Negative for syncope.    Updated Vital Signs BP (!) 160/95   Pulse 98   Temp 98.6 F (37 C)   Resp 18   SpO2 97%   Physical Exam CONSTITUTIONAL: Well developed/well nourished HEAD: Normocephalic/atraumatic EYES: EOMI/PERRL, no nystagmus, no visual field deficit  no ptosis ENMT: Mucous membranes moist NECK:  supple no meningeal signs CV: S1/S2 noted, no murmurs/rubs/gallops noted LUNGS: Lungs are clear to auscultation bilaterally, no apparent distress ABDOMEN: soft, nontender, obese NEURO:Awake/alert, face symmetric, no arm or leg drift is noted Equal 5/5 strength with hip flexion,knee flex/extension, foot dorsi/plantar flexion Cranial nerves 3/4/5/6/07/22/08/11/12 tested and intact No past pointing Sensation to light touch intact in all extremities EXTREMITIES: pulses normalx4, full ROM SKIN: warm, color normal PSYCH: no abnormalities of mood noted  (all labs ordered are listed, but only abnormal results are displayed) Labs Reviewed  COMPREHENSIVE METABOLIC PANEL WITH GFR - Abnormal; Notable for the following components:      Result Value   CO2 20 (*)    Glucose, Bld 178 (*)    BUN 21 (*)    Anion gap 16 (*)    All other components within normal limits  URINALYSIS, ROUTINE W REFLEX MICROSCOPIC - Abnormal; Notable for the following components:   Hgb urine dipstick MODERATE (*)    Protein, ur 30 (*)    All other components within normal limits  CBC WITH DIFFERENTIAL/PLATELET - Abnormal; Notable for the following components:   WBC 12.4 (*)    MCV 78.3 (*)    MCH 25.1 (*)    Lymphs Abs 4.9 (*)    All other components within normal limits  URINALYSIS, MICROSCOPIC (REFLEX) - Abnormal; Notable for the following components:   Bacteria, UA FEW (*)    All other components within normal limits  CBG MONITORING, ED - Abnormal; Notable for the following components:   Glucose-Capillary 197 (*)    All other components within normal limits  ETHANOL  URINE DRUG SCREEN  MAGNESIUM   TROPONIN T, HIGH SENSITIVITY    EKG: EKG Interpretation Date/Time:  Sunday August 03 2023 03:57:30 EDT Ventricular Rate:  101 PR Interval:  204 QRS Duration:  105 QT Interval:  368 QTC Calculation: 477 R Axis:   -18  Text Interpretation: Sinus tachycardia Multiform ventricular premature complexes Borderline  prolonged PR interval Inferior infarct, old Confirmed by Midge Golas ([id number]) on 08/03/2023 4:01:21 AM  Radiology: No results found.   Procedures   Medications Ordered in the ED  sodium chloride  0.9 % bolus 1,000 mL (1,000 mLs Intravenous New Bag/Given 08/03/23 0452)    Clinical Course as of 08/03/23 0607  Sun Aug 03, 2023  0405 Patient with history of cardiomyopathy presents with vague lightheadedness.  Denies any focal strokelike symptoms.  While in his bed, there is no focal weakness. Labs are pending at this time [DW]  0435 Glucose(!): 178 Mild hyperglycemia [DW]  0436 Patient ambulated here without ataxia and is taking oral fluids [DW]  0512 Pt feeling improved Reports his BP has been more elevated than usual recently which could be contributing to his symptoms Reports recent episode isolated episode of diarrhea but no vomiting [DW]  0605 Overall patient is feeling improved.  On repeat exam he is able to stand up and walk in the room without difficulty.  He denies any dizziness or lightheadedness.  He has no focal weakness.  His blood pressure is now improving [DW]  0605 I reviewed his monitoring here he has occasional PVCs which has been noted before by cardiology  Doubtful this is causing his symptoms [DW]  0605 His symptoms could be multifactorial due to underlying mild dehydration and does admit to walking a mile every day and likely not keeping up with his oral fluids.  He may also be having some effects from elevated blood pressure. No signs of any acute neurologic or cardiovascular emergency at this time  [DW]  0606 We discussed strict return precautions as listed in his paperwork [DW]    Clinical Course User Index [DW] Midge Golas, MD                                 Medical Decision Making Amount and/or Complexity of Data Reviewed Labs: ordered. Decision-making details documented in ED Course.   This patient presents to the ED for concern of  lightheadedness, this involves an extensive number of treatment options, and is a complaint that carries with it a high risk of complications and morbidity.  The differential diagnosis includes but is not limited to CVA, intracranial hemorrhage, acute coronary syndrome, renal failure, urinary tract infection, electrolyte disturbance, pneumonia, substance intoxication   Comorbidities that complicate the patient evaluation: Patient's presentation is complicated by their history of diabetes and obesity  Social Determinants of Health: Patient's alcohol use  increases the complexity of managing their presentation  Additional history obtained: Records reviewed Primary Care Documents  Lab Tests: I Ordered, and personally interpreted labs.  The pertinent results include: Mild dehydration, hyperglycemia  Cardiac Monitoring: The patient was maintained on a cardiac monitor.  I personally viewed and interpreted the cardiac monitor which showed an underlying rhythm of:  sinus rhythm PVCs noted  Medicines ordered and prescription drug management: I ordered medication including fluids for dehydration Reevaluation of the patient after these medicines showed that the patient    improved  Test Considered: Neuro imaging was considered, but since patient is feeling improved will defer at this time  Reevaluation: After the interventions noted above, I reevaluated the patient and found that they have :improved  Complexity of problems addressed: Patient's presentation is most consistent with  acute presentation with potential threat to life or bodily function  Disposition: After consideration of the diagnostic results and the patient's response to treatment,  I feel that the patent would benefit from discharge  .        Final diagnoses:  Dehydration  Lightheadedness    ED Discharge Orders     None          Midge Golas, MD 08/03/23 [phone]

## 2023-08-03 NOTE — ED Triage Notes (Signed)
 Pt is coming in tonight for reported dizziness while he was laying in the bed tonight watching TV. He reports that he did have a couple beers earlier in the night. He describes the dizziness as lightheadedness but not room spinning dizziness. He does have a significant heart Hx. He is otherwise stable at this time. No headache no neuro deficits.

## 2023-08-07 ENCOUNTER — Other Ambulatory Visit: Payer: Self-pay

## 2023-08-07 MED ORDER — METOPROLOL SUCCINATE ER 100 MG PO TB24: 100.0000 mg | ORAL_TABLET | Freq: Every day | ORAL | 0 refills | Status: DC

## 2023-08-24 ENCOUNTER — Other Ambulatory Visit: Payer: Self-pay | Admitting: Cardiology

## 2023-08-24 DIAGNOSIS — I428 Other cardiomyopathies: Secondary | ICD-10-CM

## 2023-08-24 DIAGNOSIS — I10 Essential (primary) hypertension: Secondary | ICD-10-CM

## 2023-08-26 ENCOUNTER — Other Ambulatory Visit: Payer: Self-pay | Admitting: Cardiology

## 2023-09-04 DIAGNOSIS — I5042 Chronic combined systolic (congestive) and diastolic (congestive) heart failure: Secondary | ICD-10-CM | POA: Insufficient documentation

## 2023-09-04 DIAGNOSIS — I77810 Thoracic aortic ectasia: Secondary | ICD-10-CM | POA: Insufficient documentation

## 2023-09-04 NOTE — Progress Notes (Unsigned)
 Cardiology Office Note:   Date:  09/05/2023  ID:  Bryan Yetta Prentiss Mickey., DOB 09/27/1965, MRN [id number] PCP: Thedora Garnette HERO, MD  [facility] Providers Cardiologist:  Lynwood Schilling, MD {  History of Present Illness:   Bryan Haapala. is a 58 y.o. male with the above past medical history including nonobstructive CAD, NICM with improved EF, hypertension, hyperlipidemia, type 2 diabetes, and GERD.   Cardiac catheterization in 2019 revealed nonobstructive CAD.  Echocardiogram at the time showed EF 20 to 25%, diffuse hypokinesis, G2 DD, moderate to severely dilated left atrium, mild MR and trivial TR.  It was felt that his cardiomyopathy was nonischemic in nature.  Repeat echocardiogram in January 2021 showed EF improved to 50 to 55%, G1 DD, no significant valvular abnormalities.  Echocardiogram in 03/2020 showed stable LV function, G1 DD, mild dilation of the aortic root measuring 43 mm, mild dilation of ascending aorta measuring 38 mm.  He was hospitalized in April 2022 in the setting of chest pain.  Serial troponin was flat, not consistent with ACS.  No further ischemic workup was recommended.  Cardiac monitor in the setting of frequent PVCs revealed NSVT and bradycardia.  He was hospitalized in September 2022 in the setting of irregular heartbeat.  Initial EKG demonstrated narrow complex tachycardia, prolonged QTc.  This occurred in the setting of hypokalemia and hypomagnesemia.  Both were replaced and no further arrhythmia was noted.  He was last seen in the office on 02/02/2021 and was stable from a cardiac standpoint. He denied any palpitations, denied symptoms concerning for angina.   He presents today for follow-up.  He was in the ED in July with diarrhea.  I did review these records for this visit.  He had sinus tachycardia with premature ventricular contractions.  However, he really was not feeling these.  He is going to see his primary apparently for his diarrhea which  has been ongoing.  I do note that in the emergency room he did not have any cardiac workup.  He actually says he has been feeling well from a cardiovascular standpoint. The patient denies any new symptoms such as chest discomfort, neck or arm discomfort. There has been no new shortness of breath, PND or orthopnea. There have been no reported palpitations, presyncope or syncope.   ROS: As stated in the HPI and negative for all other systems.  Studies Reviewed:    EKG:     August 03, 2023 sinus tachycardia, rate 101, low voltage in the limb and chest leads, poor anterior R wave progression, QTc mildly prolonged, no acute ST-T wave changes.   Risk Assessment/Calculations:          Physical Exam:   VS:  BP (!) 148/96   Pulse (!) 51   Ht 5' 8 (1.727 m)   Wt 252 lb (114.3 kg)   SpO2 94%   BMI 38.32 kg/m    Wt Readings from Last 3 Encounters:  09/05/23 252 lb (114.3 kg)  05/26/23 254 lb 9.6 oz (115.5 kg)  07/15/22 258 lb 3.2 oz (117.1 kg)     GEN: Well nourished, well developed in no acute distress NECK: No JVD; No carotid bruits CARDIAC: RRR, no murmurs, rubs, gallops RESPIRATORY:  Clear to auscultation without rales, wheezing or rhonchi  ABDOMEN: Soft, non-tender, non-distended EXTREMITIES:  No edema; No deformity   ASSESSMENT AND PLAN:      NICM: Prior EF 20 to 25%, improved to 50 to 55% in 2022.  I  will check an echocardiogram.  If his ejection fraction is still preserved he will continue on the meds as listed.  Hypertension: BP is being controlled in management of his cardiomyopathy.   Hyperlipidemia: LDL was 76 with an HDL of 29.  No change in therapy.   Ascending aorta/aortic root dilation: This was mentioned on previous echo but actually I do not see it on the CT that he had done a few years ago.  We will follow-up the size when he gets his echocardiogram.   Type 2 diabetes: A1c was 6.8.  Management per his primary provider.   QTc: I do note this on the most recent EKG.   He is not having any symptoms.  He should avoid QT prolonging drugs.    Follow up ***  Signed, Lynwood Schilling, MD

## 2023-09-05 ENCOUNTER — Ambulatory Visit: Attending: Cardiology | Admitting: Cardiology

## 2023-09-05 ENCOUNTER — Ambulatory Visit: Payer: Self-pay | Admitting: *Deleted

## 2023-09-05 ENCOUNTER — Encounter: Payer: Self-pay | Admitting: Cardiology

## 2023-09-05 VITALS — BP 148/96 | HR 51 | Ht 68.0 in | Wt 252.0 lb

## 2023-09-05 DIAGNOSIS — I5042 Chronic combined systolic (congestive) and diastolic (congestive) heart failure: Secondary | ICD-10-CM | POA: Diagnosis not present

## 2023-09-05 DIAGNOSIS — I10 Essential (primary) hypertension: Secondary | ICD-10-CM

## 2023-09-05 DIAGNOSIS — I428 Other cardiomyopathies: Secondary | ICD-10-CM

## 2023-09-05 DIAGNOSIS — E785 Hyperlipidemia, unspecified: Secondary | ICD-10-CM | POA: Diagnosis not present

## 2023-09-05 DIAGNOSIS — I77810 Thoracic aortic ectasia: Secondary | ICD-10-CM | POA: Diagnosis not present

## 2023-09-05 NOTE — Telephone Encounter (Signed)
 Copied from CRM [id number]. Topic: Clinical - Red Word Triage >> Sep 05, 2023  2:26 PM Rosina BIRCH wrote: Red Word that prompted transfer to Nurse Triage: patient wife called stating the patient is having diarrhea and it want go away Reason for Disposition  Diarrhea is a chronic symptom (recurrent or ongoing AND present > 4 weeks)  Answer Assessment - Initial Assessment Questions 1. DIARRHEA SEVERITY: How bad is the diarrhea? How many more stools have you had in the past 24 hours than normal?      Wife, Inocente He is having diarrhea 2. ONSET: When did the diarrhea begin?      Started a month ago.  It's off and on. 3. STOOL DESCRIPTION:  How loose or watery is the diarrhea? What is the stool color? Is there any blood or mucous in the stool?     No blood.   4. VOMITING: Are you also vomiting? If Yes, ask: How many times in the past 24 hours?      No  5. ABDOMEN PAIN: Are you having any abdomen pain? If Yes, ask: What does it feel like? (e.g., crampy, dull, intermittent, constant)      No 6. ABDOMEN PAIN SEVERITY: If present, ask: How bad is the pain?  (e.g., Scale 1-10; mild, moderate, or severe)     None 7. ORAL INTAKE: If vomiting, Have you been able to drink liquids? How much liquids have you had in the past 24 hours?     He is eating fine 8. HYDRATION: Any signs of dehydration? (e.g., dry mouth [not just dry lips], too weak to stand, dizziness, new weight loss) When did you last urinate?     One day he was dehydrated.   He went to the ED about a month ago. 9. EXPOSURE: Have you traveled to a foreign country recently? Have you been exposed to anyone with diarrhea? Could you have eaten any food that was spoiled?     Not asked 10. ANTIBIOTIC USE: Are you taking antibiotics now or have you taken antibiotics in the past 2 months?       No 11. OTHER SYMPTOMS: Do you have any other symptoms? (e.g., fever, blood in stool)       No 12. PREGNANCY: Is there any  chance you are pregnant? When was your last menstrual period?        N/A  Protocols used: Diarrhea-A-AH FYI Only or Action Required?: FYI only for provider.  Patient was last seen in primary care on 05/26/2023 by Thedora Garnette HERO, MD.  Called Nurse Triage reporting Diarrhea.  Symptoms began about a month ago.On and off for a month  Interventions attempted: Rest, hydration, or home remedies. Drinking a lot of water and electrolyte drinks  Symptoms are: unchanged.  Triage Disposition: See PCP Within 2 Weeks  Patient/caregiver understands and will follow disposition?: Yes

## 2023-09-05 NOTE — Patient Instructions (Signed)
 Medication Instructions:  Continue same medications *If you need a refill on your cardiac medications before your next appointment, please call your pharmacy*  Lab Work: None ordered  Testing/Procedures: Echo  first available   Follow-Up: At [facility], you and your health needs are our priority.  As part of our continuing mission to provide you with exceptional heart care, our providers are all part of one team.  This team includes your primary Cardiologist (physician) and Advanced Practice Providers or APPs (Physician Assistants and Nurse Practitioners) who all work together to provide you with the care you need, when you need it.  Your next appointment:  1 year    Call in April to schedule August appointment     Provider:  Dr.Hochrein    We recommend signing up for the patient portal called MyChart.  Sign up information is provided on this After Visit Summary.  MyChart is used to connect with patients for Virtual Visits (Telemedicine).  Patients are able to view lab/test results, encounter notes, upcoming appointments, etc.  Non-urgent messages can be sent to your provider as well.   To learn more about what you can do with MyChart, go to [email or web address].

## 2023-09-08 ENCOUNTER — Ambulatory Visit (INDEPENDENT_AMBULATORY_CARE_PROVIDER_SITE_OTHER): Admitting: Family Medicine

## 2023-09-08 ENCOUNTER — Telehealth: Payer: Self-pay

## 2023-09-08 ENCOUNTER — Encounter: Payer: Self-pay | Admitting: Family Medicine

## 2023-09-08 ENCOUNTER — Other Ambulatory Visit: Payer: Self-pay

## 2023-09-08 VITALS — BP 138/70 | HR 82 | Temp 97.6°F | Ht 68.0 in | Wt 251.6 lb

## 2023-09-08 DIAGNOSIS — I428 Other cardiomyopathies: Secondary | ICD-10-CM

## 2023-09-08 DIAGNOSIS — K219 Gastro-esophageal reflux disease without esophagitis: Secondary | ICD-10-CM | POA: Diagnosis not present

## 2023-09-08 DIAGNOSIS — Z7984 Long term (current) use of oral hypoglycemic drugs: Secondary | ICD-10-CM

## 2023-09-08 DIAGNOSIS — E782 Mixed hyperlipidemia: Secondary | ICD-10-CM

## 2023-09-08 DIAGNOSIS — E1165 Type 2 diabetes mellitus with hyperglycemia: Secondary | ICD-10-CM | POA: Diagnosis not present

## 2023-09-08 DIAGNOSIS — I10 Essential (primary) hypertension: Secondary | ICD-10-CM

## 2023-09-08 DIAGNOSIS — R197 Diarrhea, unspecified: Secondary | ICD-10-CM | POA: Diagnosis not present

## 2023-09-08 MED ORDER — METOPROLOL SUCCINATE ER 100 MG PO TB24: 100.0000 mg | ORAL_TABLET | Freq: Every day | ORAL | 3 refills | Status: AC

## 2023-09-08 MED ORDER — LANSOPRAZOLE 30 MG PO CPDR: 30.0000 mg | DELAYED_RELEASE_CAPSULE | Freq: Two times a day (BID) | ORAL | 3 refills | Status: AC

## 2023-09-08 MED ORDER — SACUBITRIL-VALSARTAN 97-103 MG PO TABS
1.0000 | ORAL_TABLET | Freq: Two times a day (BID) | ORAL | 3 refills | Status: AC
Start: 2023-09-08 — End: ?

## 2023-09-08 MED ORDER — POTASSIUM CHLORIDE CRYS ER 20 MEQ PO TBCR: 20.0000 meq | EXTENDED_RELEASE_TABLET | Freq: Every day | ORAL | 3 refills | Status: AC

## 2023-09-08 MED ORDER — PANTOPRAZOLE SODIUM 40 MG PO TBEC: 40.0000 mg | DELAYED_RELEASE_TABLET | Freq: Two times a day (BID) | ORAL | 3 refills | Status: AC

## 2023-09-08 MED ORDER — ROSUVASTATIN CALCIUM 20 MG PO TABS: 20.0000 mg | ORAL_TABLET | Freq: Every day | ORAL | 3 refills | Status: AC

## 2023-09-08 NOTE — Assessment & Plan Note (Signed)
 A1c has been at goal. I will reassess this today. Continue metformin  1,00 mg daily.

## 2023-09-08 NOTE — Telephone Encounter (Signed)
 Patient has appointment today. Dm/cma

## 2023-09-08 NOTE — Progress Notes (Signed)
 [facility] PRIMARY CARE [facility] [address] [place] [address] Dept: [phone] Dept Fax: [phone]  Office Visit  Subjective:    Patient ID: Bryan Yetta Prentiss Mickey., male    DOB: 01-28-1965, 58 y.o..   MRN: [id number]  Chief Complaint  Patient presents with   Diarrhea    C/o having diarrhea x 1 month.    History of Present Illness:  Patient is in today for complaining of a 37-month history of watery diarrhea. He recalls that when this started, he was in [place] . He had eaten fried clams, which he felt did not taste right to him. The next morning, he started to have the watery diarrhea. He has had diarrhea about 70% of the time since. He denies any fever, N/V, or blood in his stool.  Mr. Vandam has a history of a nonischemic cardiomyopathy. Several years ago, his ejection fraction was 25-35%. His last EF (03/2020) by the local cardiolgoist was 50-55%. He is currently managed on sacubitril -valsartan  (Entresto ) 97-103 mg bid and metoprolol  succinate 100 mg daily. He had a heart cath in 2020 that showed a 30% stenosis of his proximal RCA. He denies any current issues with chest pain, dyspnea, orthopnea, or lower leg edema.   Mr. Sear has a history of Type 2 diabetes. He is managed on metformin  1,000 mg daily. His blood sugars have remained in good control at home.   Mr. Heinecke also has a history of hyperlipidemia and is managed on rosuvastatin  20 mg daily.    Mr. Bena has a history of GERD and is managed on pantoprazole  40 mg bid. He notes that he was told by his insurance that his prescription would cast ~$500. As such he has been off of his meds for a while.   Past Medical History: Patient Active Problem List   Diagnosis Date Noted   Chronic combined systolic and diastolic heart failure (HCC) 09/04/2023   Aortic root dilatation (HCC) 09/04/2023   Aortic root enlargement (HCC) 02/01/2021   Hyperlipidemia 02/01/2021    Gastroesophageal reflux disease without esophagitis 01/01/2021   Tachyarrhythmia 09/20/2020   Ventricular tachycardia, polymorphic (HCC)    History of colon polyps 05/08/2020   Psoriasis 03/27/2020   Type 2 diabetes mellitus with hyperglycemia, without long-term current use of insulin  (HCC) 06/12/2018   Class 2 severe obesity with serious comorbidity and body mass index (BMI) of 38.0 to 38.9 in adult [facility]) 06/12/2018   Nonischemic cardiomyopathy (HCC) 10/27/2017   Elevated coronary artery calcium  score 10/14/2017   Essential hypertension 10/14/2017   Past Surgical History:  Procedure Laterality Date   HAND SURGERY     LEFT HEART CATH AND CORONARY ANGIOGRAPHY N/A 10/29/2017   Procedure: LEFT HEART CATH AND CORONARY ANGIOGRAPHY;  Surgeon: Burnard Debby LABOR, MD;  Location: [facility]VAS[facility];  Service: Cardiovascular;  Laterality: N/A;   Family History  Adopted: Yes  Problem Relation Age of Onset   Diabetes Mother    Colon cancer Neg Hx    Colon polyps Neg Hx    Esophageal cancer Neg Hx    Rectal cancer Neg Hx    Stomach cancer Neg Hx    Outpatient Medications Prior to Visit  Medication Sig Dispense Refill   Blood Glucose Monitoring Suppl (TRUE METRIX AIR GLUCOSE METER) w/Device KIT 1 kit by Does not apply route 2 (two) times daily. 1 kit 0   fluticasone  (FLONASE ) 50 MCG/ACT nasal spray Place 1 spray into both nostrils 2 (two) times daily. Reduce to  once a day, after 1 month. 16 g 6   glucose blood (TRUE METRIX BLOOD GLUCOSE TEST) test strip Use as instructed to check blood sugars twice daily and as needed 100 each 3   metFORMIN  (GLUCOPHAGE ) 1000 MG tablet Take 1 tablet (1,000 mg total) by mouth at bedtime. 90 tablet 3   metoprolol  succinate (TOPROL -XL) 100 MG 24 hr tablet Take 1 tablet (100 mg total) by mouth daily. 90 tablet 3   pantoprazole  (PROTONIX ) 40 MG tablet Take 1 tablet (40 mg total) by mouth 2 (two) times daily. 180 tablet 3   potassium chloride  SA (KLOR-CON  M) 20 MEQ tablet  Take 1 tablet (20 mEq total) by mouth daily. 90 tablet 3   rosuvastatin  (CRESTOR ) 20 MG tablet Take 1 tablet (20 mg total) by mouth daily. 90 tablet 3   sacubitril -valsartan  (ENTRESTO ) 97-103 MG Take 1 tablet by mouth 2 (two) times daily. 180 tablet 3   triamcinolone  cream (KENALOG ) 0.1 % Apply 1 Application topically 2 (two) times daily. 45 g 3   TRUEPLUS LANCETS 28G MISC Use twice daily to check blood sugars and as needed 100 each 3   zolpidem  (AMBIEN ) 5 MG tablet Take 1 tablet (5 mg total) by mouth at bedtime as needed for sleep. 15 tablet 1   clobetasol  cream (TEMOVATE ) 0.05 % Apply 1 Application topically 2 (two) times daily as needed (for swelling, itching, or redness of affected areas). (Patient not taking: Reported on 09/05/2023) 45 g 2   No facility-administered medications prior to visit.   Allergies  Allergen Reactions   Jardiance  [Empagliflozin ] Other (See Comments)    Vertigo      Objective:   Today's Vitals   09/08/23 1510  BP: 138/70  Pulse: 82  Temp: 97.6 F (36.4 C)  TempSrc: Temporal  SpO2: 97%  Weight: 251 lb 9.6 oz (114.1 kg)  Height: 5' 8 (1.727 m)   Body mass index is 38.26 kg/m.   General: Well developed, well nourished. No acute distress. Abdomen: Soft, non-tender. Bowel sounds positive, normal pitch and frequency. No hepatosplenomegaly. No rebound or guarding. Psych: Alert and oriented. Normal mood and affect.  Health Maintenance Due  Topic Date Due   Hepatitis B Vaccines 19-59 Average Risk (1 of 3 - 19+ 3-dose series) Never done   Zoster Vaccines- Shingrix (1 of 2) Never done   OPHTHALMOLOGY EXAM  08/14/2021     Assessment & Plan:   Problem List Items Addressed This Visit       Digestive   Acute diarrhea - Primary   I will check blood and stool studies to assess for common causes of diarrhea.      Relevant Orders   Basic metabolic panel with GFR   C. difficile GDH and Toxin A/B   CBC with Differential/Platelet   Calprotectin, Fecal    Gastrointestinal Pathogen Pnl RT, PCR   Giardia antigen   Gastroesophageal reflux disease without esophagitis   I will try switching him to lansoprazole  30 mg bid. I believe his issue is that his insurance will only cover a limited quantity of a PPI in a given year.      Relevant Medications   lansoprazole  (PREVACID ) 30 MG capsule     Endocrine   Type 2 diabetes mellitus with hyperglycemia, without long-term current use of insulin  (HCC)   A1c has been at goal. I will reassess this today. Continue metformin  1,00 mg daily.      Relevant Orders   Basic metabolic panel with GFR  Hemoglobin A1c    Return in about 3 months (around 12/09/2023) for Reassessment.   Garnette CHRISTELLA Simpler, MD

## 2023-09-08 NOTE — Telephone Encounter (Signed)
 Called patient left message on voice mail I was calling to let you know the answer to your question on Friday. I spoke to Dr.Hochrein.

## 2023-09-08 NOTE — Assessment & Plan Note (Signed)
 I will try switching him to lansoprazole  30 mg bid. I believe his issue is that his insurance will only cover a limited quantity of a PPI in a given year.

## 2023-09-08 NOTE — Assessment & Plan Note (Signed)
 I will check blood and stool studies to assess for common causes of diarrhea.

## 2023-09-09 ENCOUNTER — Ambulatory Visit: Payer: Self-pay | Admitting: Family Medicine

## 2023-09-09 DIAGNOSIS — E1165 Type 2 diabetes mellitus with hyperglycemia: Secondary | ICD-10-CM

## 2023-09-09 LAB — CBC WITH DIFFERENTIAL/PLATELET
Basophils Absolute: 0.1 K/uL (ref 0.0–0.1)
Basophils Relative: 0.9 % (ref 0.0–3.0)
Eosinophils Absolute: 0.2 K/uL (ref 0.0–0.7)
Eosinophils Relative: 1.8 % (ref 0.0–5.0)
HCT: 42.1 % (ref 39.0–52.0)
Hemoglobin: 13.6 g/dL (ref 13.0–17.0)
Lymphocytes Relative: 27.9 % (ref 12.0–46.0)
Lymphs Abs: 2.9 K/uL (ref 0.7–4.0)
MCHC: 32.2 g/dL (ref 30.0–36.0)
MCV: 77.2 fl — ABNORMAL LOW (ref 78.0–100.0)
Monocytes Absolute: 0.6 K/uL (ref 0.1–1.0)
Monocytes Relative: 6.1 % (ref 3.0–12.0)
Neutro Abs: 6.6 K/uL (ref 1.4–7.7)
Neutrophils Relative %: 63.3 % (ref 43.0–77.0)
Platelets: 248 K/uL (ref 150.0–400.0)
RBC: 5.45 Mil/uL (ref 4.22–5.81)
RDW: 14 % (ref 11.5–15.5)
WBC: 10.5 K/uL (ref 4.0–10.5)

## 2023-09-09 LAB — BASIC METABOLIC PANEL WITH GFR
BUN: 16 mg/dL (ref 6–23)
CO2: 23 meq/L (ref 19–32)
Calcium: 9.4 mg/dL (ref 8.4–10.5)
Chloride: 105 meq/L (ref 96–112)
Creatinine, Ser: 1.02 mg/dL (ref 0.40–1.50)
GFR: 80.53 mL/min (ref >60.00)
Glucose, Bld: 117 mg/dL — ABNORMAL HIGH (ref 70–99)
Potassium: 4.2 meq/L (ref 3.5–5.1)
Sodium: 139 meq/L (ref 135–145)

## 2023-09-09 LAB — HEMOGLOBIN A1C: Hgb A1c MFr Bld: 7.4 % — ABNORMAL HIGH (ref 4.6–6.5)

## 2023-09-09 MED ORDER — SITAGLIPTIN PHOSPHATE 25 MG PO TABS
25.0000 mg | ORAL_TABLET | Freq: Every day | ORAL | 3 refills | Status: AC
Start: 2023-09-09 — End: ?

## 2023-09-25 ENCOUNTER — Encounter (HOSPITAL_BASED_OUTPATIENT_CLINIC_OR_DEPARTMENT_OTHER): Payer: Self-pay

## 2023-09-26 ENCOUNTER — Other Ambulatory Visit (HOSPITAL_BASED_OUTPATIENT_CLINIC_OR_DEPARTMENT_OTHER)

## 2023-10-01 ENCOUNTER — Ambulatory Visit
Admission: EM | Admit: 2023-10-01 | Discharge: 2023-10-01 | Disposition: A | Discharge status: Home / Self Care | Attending: Family Medicine | Admitting: Family Medicine

## 2023-10-01 DIAGNOSIS — L249 Irritant contact dermatitis, unspecified cause: Secondary | ICD-10-CM

## 2023-10-01 MED ORDER — HYDROXYZINE HCL 25 MG PO TABS: 12.5000 mg | ORAL_TABLET | Freq: Every evening | ORAL | 0 refills | Status: AC | PRN

## 2023-10-01 MED ORDER — PREDNISONE 20 MG PO TABS: ORAL_TABLET | ORAL | 0 refills | Status: AC

## 2023-10-01 NOTE — ED Triage Notes (Signed)
 Pt c/o scattered rash x 2 days after hiking-using neosporin ointment-NAD-steady gait

## 2023-10-01 NOTE — ED Provider Notes (Signed)
 [facility]  Note:  This document was prepared using Conservation officer, historic buildings and may include unintentional dictation errors.  MRN: [id number] DOB: 09/28/1965  Subjective:   Bryan English. is a 58 y.o. male presenting for 2-day history of itching over the limbs, torso.  Has started to feel itching on the face today as well.  Symptoms started after he went hiking and got into thick brush.  No shortness of breath, wheezing, nausea, vomiting, abdominal pain.  Has previously taken steroids and has not had significant cardiac side effects.  No current facility-administered medications for this encounter.  Current Outpatient Medications:    Blood Glucose Monitoring Suppl (TRUE METRIX AIR GLUCOSE METER) w/Device KIT, 1 kit by Does not apply route 2 (two) times daily., Disp: 1 kit, Rfl: 0   fluticasone  (FLONASE ) 50 MCG/ACT nasal spray, Place 1 spray into both nostrils 2 (two) times daily. Reduce to once a day, after 1 month., Disp: 16 g, Rfl: 6   glucose blood (TRUE METRIX BLOOD GLUCOSE TEST) test strip, Use as instructed to check blood sugars twice daily and as needed, Disp: 100 each, Rfl: 3   lansoprazole  (PREVACID ) 30 MG capsule, Take 1 capsule (30 mg total) by mouth 2 (two) times daily before a meal., Disp: 180 capsule, Rfl: 3   metFORMIN  (GLUCOPHAGE ) 1000 MG tablet, Take 1 tablet (1,000 mg total) by mouth at bedtime., Disp: 90 tablet, Rfl: 3   metoprolol  succinate (TOPROL -XL) 100 MG 24 hr tablet, Take 1 tablet (100 mg total) by mouth daily., Disp: 90 tablet, Rfl: 3   pantoprazole  (PROTONIX ) 40 MG tablet, Take 1 tablet (40 mg total) by mouth 2 (two) times daily., Disp: 180 tablet, Rfl: 3   potassium chloride  SA (KLOR-CON  M) 20 MEQ tablet, Take 1 tablet (20 mEq total) by mouth daily., Disp: 90 tablet, Rfl: 3   rosuvastatin  (CRESTOR ) 20 MG tablet, Take 1 tablet (20 mg total) by mouth daily., Disp: 90 tablet, Rfl: 3   sacubitril -valsartan  (ENTRESTO )  97-103 MG, Take 1 tablet by mouth 2 (two) times daily., Disp: 180 tablet, Rfl: 3   sitaGLIPtin  (JANUVIA ) 25 MG tablet, Take 1 tablet (25 mg total) by mouth daily., Disp: 90 tablet, Rfl: 3   triamcinolone  cream (KENALOG ) 0.1 %, Apply 1 Application topically 2 (two) times daily., Disp: 45 g, Rfl: 3   TRUEPLUS LANCETS 28G MISC, Use twice daily to check blood sugars and as needed, Disp: 100 each, Rfl: 3   zolpidem  (AMBIEN ) 5 MG tablet, Take 1 tablet (5 mg total) by mouth at bedtime as needed for sleep., Disp: 15 tablet, Rfl: 1   Allergies  Allergen Reactions   Jardiance  [Empagliflozin ] Other (See Comments)    Vertigo     Past Medical History:  Diagnosis Date   Diabetes mellitus without complication (HCC)    GERD (gastroesophageal reflux disease)    Hyperlipidemia    Hypertension    NICM (nonischemic cardiomyopathy) (HCC) 10/29/2017   EF 20% by echo, minimal CAD at cath     Past Surgical History:  Procedure Laterality Date   HAND SURGERY     LEFT HEART CATH AND CORONARY ANGIOGRAPHY N/A 10/29/2017   Procedure: LEFT HEART CATH AND CORONARY ANGIOGRAPHY;  Surgeon: Burnard Debby LABOR, MD;  Location: [facility];  Service: Cardiovascular;  Laterality: N/A;    Family History  Adopted: Yes  Problem Relation Age of Onset   Diabetes Mother    Colon cancer Neg Hx  Colon polyps Neg Hx    Esophageal cancer Neg Hx    Rectal cancer Neg Hx    Stomach cancer Neg Hx     Social History   Tobacco Use   Smoking status: Never   Smokeless tobacco: Never  Vaping Use   Vaping status: Never Used  Substance Use Topics   Alcohol use: Not Currently   Drug use: Never    ROS   Objective:   Vitals: BP 132/83 (BP Location: Right Arm)   Pulse 83   Temp 98.3 F (36.8 C) (Oral)   Resp 20   SpO2 93%   Physical Exam Constitutional:      General: He is not in acute distress.    Appearance: Normal appearance. He is well-developed and normal weight. He is not ill-appearing,  toxic-appearing or diaphoretic.  HENT:     Head: Normocephalic and atraumatic.     Right Ear: External ear normal.     Left Ear: External ear normal.     Nose: Nose normal.     Mouth/Throat:     Mouth: Mucous membranes are moist.     Pharynx: No pharyngeal swelling, oropharyngeal exudate, posterior oropharyngeal erythema or uvula swelling.     Tonsils: No tonsillar exudate or tonsillar abscesses. 0 on the right. 0 on the left.  Eyes:     General: No scleral icterus.       Right eye: No discharge.        Left eye: No discharge.     Extraocular Movements: Extraocular movements intact.  Cardiovascular:     Rate and Rhythm: Normal rate and regular rhythm.     Heart sounds: Normal heart sounds. No murmur heard.    No friction rub. No gallop.  Pulmonary:     Effort: Pulmonary effort is normal. No respiratory distress.     Breath sounds: Normal breath sounds. No stridor. No wheezing, rhonchi or rales.  Musculoskeletal:     Cervical back: Normal range of motion.  Skin:    Findings: Rash (large patches of urticarial erythematic lesions over the forearms, torso and to a lesser degree over the left side of his face) present.  Neurological:     Mental Status: He is alert and oriented to person, place, and time.  Psychiatric:        Mood and Affect: Mood normal.        Behavior: Behavior normal.        Thought Content: Thought content normal.        Judgment: Judgment normal.     Assessment and Plan :   PDMP not reviewed this encounter.  1. Irritant dermatitis    Suspect an irritant dermatitis and recommended oral prednisone  course, hydroxyzine  for itching. Counseled patient on potential for adverse effects with medications prescribed/recommended today, ER and return-to-clinic precautions discussed, patient verbalized understanding.    Christopher Savannah, PA-C 10/01/23 1710

## 2023-10-23 ENCOUNTER — Encounter (HOSPITAL_BASED_OUTPATIENT_CLINIC_OR_DEPARTMENT_OTHER): Payer: Self-pay

## 2023-10-24 ENCOUNTER — Ambulatory Visit (HOSPITAL_BASED_OUTPATIENT_CLINIC_OR_DEPARTMENT_OTHER)

## 2023-10-24 DIAGNOSIS — I428 Other cardiomyopathies: Secondary | ICD-10-CM

## 2023-10-24 LAB — ECHOCARDIOGRAM COMPLETE
AR max vel: 3.22 cm2
AV Area VTI: 3.2 cm2
AV Area mean vel: 2.95 cm2
AV Mean grad: 4 mmHg
AV Peak grad: 7.2 mmHg
Ao pk vel: 1.34 m/s
Area-P 1/2: 6.12 cm2
S' Lateral: 3.58 cm

## 2023-10-28 ENCOUNTER — Ambulatory Visit: Payer: Self-pay | Admitting: Cardiology
